# Patient Record
Sex: Male | Born: 1970 | Race: White | Hispanic: No | Marital: Married | Smoking: Never smoker
Health system: Southern US, Community
[De-identification: ages and names within clinical notes are randomized; demographics above are authoritative.]

## PROBLEM LIST (undated history)

## (undated) DIAGNOSIS — C801 Malignant (primary) neoplasm, unspecified: Secondary | ICD-10-CM

## (undated) DIAGNOSIS — T8859XA Other complications of anesthesia, initial encounter: Secondary | ICD-10-CM

## (undated) DIAGNOSIS — K5790 Diverticulosis of intestine, part unspecified, without perforation or abscess without bleeding: Secondary | ICD-10-CM

## (undated) DIAGNOSIS — C189 Malignant neoplasm of colon, unspecified: Secondary | ICD-10-CM

## (undated) DIAGNOSIS — I82401 Acute embolism and thrombosis of unspecified deep veins of right lower extremity: Secondary | ICD-10-CM

## (undated) DIAGNOSIS — R112 Nausea with vomiting, unspecified: Secondary | ICD-10-CM

## (undated) DIAGNOSIS — I10 Essential (primary) hypertension: Secondary | ICD-10-CM

## (undated) DIAGNOSIS — D649 Anemia, unspecified: Secondary | ICD-10-CM

## (undated) DIAGNOSIS — Z9889 Other specified postprocedural states: Secondary | ICD-10-CM

## (undated) DIAGNOSIS — T7840XA Allergy, unspecified, initial encounter: Secondary | ICD-10-CM

## (undated) HISTORY — DX: Anemia, unspecified: D64.9

## (undated) HISTORY — DX: Acute embolism and thrombosis of unspecified deep veins of right lower extremity: I82.401

## (undated) HISTORY — PX: TONSILLECTOMY: SUR1361

## (undated) HISTORY — PX: DIAGNOSTIC LAPAROSCOPY: SUR761

## (undated) HISTORY — DX: Malignant (primary) neoplasm, unspecified: C80.1

## (undated) HISTORY — DX: Malignant neoplasm of colon, unspecified: C18.9

## (undated) HISTORY — PX: WISDOM TOOTH EXTRACTION: SHX21

## (undated) HISTORY — PX: VASECTOMY: SHX75

## (undated) HISTORY — DX: Allergy, unspecified, initial encounter: T78.40XA

## (undated) HISTORY — PX: COLON SURGERY: SHX602

---

## 2002-07-15 ENCOUNTER — Emergency Department (HOSPITAL_COMMUNITY): Admission: EM | Admit: 2002-07-15 | Discharge: 2002-07-15 | Payer: Self-pay | Admitting: Emergency Medicine

## 2015-11-14 ENCOUNTER — Encounter: Payer: Self-pay | Admitting: Physician Assistant

## 2015-11-18 ENCOUNTER — Encounter: Payer: Self-pay | Admitting: *Deleted

## 2015-11-22 ENCOUNTER — Ambulatory Visit (INDEPENDENT_AMBULATORY_CARE_PROVIDER_SITE_OTHER): Payer: BC Managed Care – PPO | Admitting: Physician Assistant

## 2015-11-22 ENCOUNTER — Other Ambulatory Visit (INDEPENDENT_AMBULATORY_CARE_PROVIDER_SITE_OTHER): Payer: BC Managed Care – PPO

## 2015-11-22 ENCOUNTER — Encounter: Payer: Self-pay | Admitting: Physician Assistant

## 2015-11-22 ENCOUNTER — Encounter: Payer: Self-pay | Admitting: Gastroenterology

## 2015-11-22 VITALS — BP 120/78 | HR 79 | Ht 75.0 in | Wt 215.0 lb

## 2015-11-22 DIAGNOSIS — D509 Iron deficiency anemia, unspecified: Secondary | ICD-10-CM | POA: Diagnosis not present

## 2015-11-22 DIAGNOSIS — R5383 Other fatigue: Secondary | ICD-10-CM

## 2015-11-22 LAB — CBC WITH DIFFERENTIAL/PLATELET
BASOS PCT: 0.5 % (ref 0.0–3.0)
Basophils Absolute: 0 10*3/uL (ref 0.0–0.1)
EOS PCT: 3.2 % (ref 0.0–5.0)
Eosinophils Absolute: 0.3 10*3/uL (ref 0.0–0.7)
HEMATOCRIT: 27.4 % — AB (ref 39.0–52.0)
Hemoglobin: 8.4 g/dL — ABNORMAL LOW (ref 13.0–17.0)
LYMPHS ABS: 1.9 10*3/uL (ref 0.7–4.0)
LYMPHS PCT: 22.5 % (ref 12.0–46.0)
MCHC: 30.6 g/dL (ref 30.0–36.0)
MCV: 71.5 fl — AB (ref 78.0–100.0)
MONOS PCT: 8 % (ref 3.0–12.0)
Monocytes Absolute: 0.7 10*3/uL (ref 0.1–1.0)
NEUTROS ABS: 5.6 10*3/uL (ref 1.4–7.7)
NEUTROS PCT: 65.8 % (ref 43.0–77.0)
PLATELETS: 402 10*3/uL — AB (ref 150.0–400.0)
RBC: 3.84 Mil/uL — ABNORMAL LOW (ref 4.22–5.81)
RDW: 18 % — AB (ref 11.5–15.5)
WBC: 8.5 10*3/uL (ref 4.0–10.5)

## 2015-11-22 MED ORDER — NA SULFATE-K SULFATE-MG SULF 17.5-3.13-1.6 GM/177ML PO SOLN: 1.0000 | Freq: Once | ORAL | Status: DC

## 2015-11-22 MED ORDER — FERROUS SULFATE 325 (65 FE) MG PO TABS: 325.0000 mg | ORAL_TABLET | Freq: Two times a day (BID) | ORAL | Status: DC

## 2015-11-22 NOTE — Progress Notes (Signed)
Patient ID: Derry Kassel., male   DOB: [birth year], 45 y.o.   MRN: [id number]   Subjective:    Patient ID: Troy Coder., male    DOB: 04-Aug-1970, 45 y.o.   MRN: [id number]  HPI  Troy Lambert Is a very pleasant  45 year old white male [employer], new to GI today referred by Everardo Beals NP/[place] [facility] for evaluation of iron deficiency anemia. Patient has not had any prior GI workup and is otherwise in good health. He says he began noticing some fatigue last fall which has been persistent and perhaps gradually increased. He initially attributed this to stress and some depression as his father died last summer, he says he had also taken on extra duties at work etc. He says he has felt like he has lost his energy and drive. His appetite has been fine his weight has been stable he denies any nausea or vomiting no dysphagia or odynophagia. Not had any changes in his bowel habits and says he has always had 2-3 bowel movements per day area and he has not noted any melena. He has seen small amounts of bright red blood just on the tissue over the past several months but has hemorrhoids area and he says he has only seen blood a few times. In the past few months. He is not on any regular aspirin or NSAIDs. Family history is negative for colon cancer or polyps. Patient had labs done in July 2016 showed hemoglobin of 14.9 Exline Most recent labs on 11/05/2015 hemoglobin 7.7 hematocrit 25.6 MCV of 77 TIBC 420 serum iron 14 iron sat 3 and ferritin of 4, B-12 within normal limits.  Review of Systems Pertinent positive and negative review of systems were noted in the above HPI section.  All other review of systems was otherwise negative.  Outpatient Encounter Prescriptions as of 11/22/2015  Medication Sig  . loratadine (CLARITIN) 10 MG tablet Take 10 mg by mouth daily.  [name] Kitchen zolpidem (AMBIEN) 10 MG tablet Take 10 mg by mouth at bedtime as needed for sleep.  . ferrous sulfate (IRON SUPPLEMENT) 325 (65  FE) MG tablet Take 1 tablet (325 mg total) by mouth 2 (two) times daily with a meal.  . Na Sulfate-K Sulfate-Mg Sulf SOLN Take 1 kit by mouth once.  . [DISCONTINUED] amoxicillin-clavulanate (AUGMENTIN) 875-125 MG tablet Take 1 tablet by mouth 2 (two) times daily.  . [DISCONTINUED] cefTRIAXone (ROCEPHIN) 1 g injection Inject into the muscle every morning. Take 1 g every day by injection route as directed.  . [DISCONTINUED] cyclobenzaprine (FLEXERIL) 10 MG tablet Take 10 mg by mouth 3 (three) times daily.  . [DISCONTINUED] DOXYCYCLINE HYCLATE PO Take 100 mg by mouth. Take 1 capsule daily  . [DISCONTINUED] HYDROcodone-acetaminophen (NORCO/VICODIN) 5-325 MG tablet Take 1 tablet by mouth.  . [DISCONTINUED] ketorolac (TORADOL) 60 MG/2ML SOLN injection Inject 60 mg into the muscle once. Inject 2 ml as needed by intramuscular route as directed for 1 day.  . [DISCONTINUED] levofloxacin (LEVAQUIN) 250 MG tablet Take 250 mg by mouth daily. Take 2 tablets every day by mouth as directed for 10 days.  . [DISCONTINUED] methylPREDNISolone (MEDROL DOSEPAK) 4 MG TBPK tablet Take by mouth. As directed.  . [DISCONTINUED] mometasone (NASONEX) 50 MCG/ACT nasal spray Place 2 sprays into the nose daily.  . [DISCONTINUED] predniSONE (DELTASONE) 20 MG tablet Take 20 mg by mouth daily with breakfast. Take 3 tablets daily by oral route for 6 days.  . [DISCONTINUED] promethazine-dextromethorphan (PROMETHAZINE-DM) 6.25-15 MG/5ML  syrup Take 5 mLs by mouth 4 (four) times daily as needed for cough. Take 5 ml every 6 hours by mouth for 6 days.  . [DISCONTINUED] traMADol (ULTRAM) 50 MG tablet Take by mouth every 6 (six) hours as needed. Take 1 tab every 6 hours as needed for pain for 7 days  . [DISCONTINUED] traZODone (DESYREL) 150 MG tablet Take by mouth at bedtime.   No facility-administered encounter medications on file as of 11/22/2015.   No Known Allergies There are no active problems to display for this patient.  Social  History   Social History  . Marital Status: Married    Spouse Name: N/A  . Number of Children: N/A  . Years of Education: N/A   Occupational History  . Not on file.   Social History Main Topics  . Smoking status: Never Smoker   . Smokeless tobacco: Current User  . Alcohol Use: 0.0 oz/week    0 Standard drinks or equivalent per week     Comment: a couple of beers a night; 4-5 on weekend  . Drug Use: Not on file  . Sexual Activity: Not on file   Other Topics Concern  . Not on file   Social History Narrative  . No narrative on file    Troy Lambert family history is not on file.      Objective:    Filed Vitals:   11/22/15 1001  BP: 120/78  Pulse: 79    Physical Exam  well-developed white male in no acute distress, accompanied by his wife blood pressure 120/78 pulse 79 height 6 foot 3 weight 215. HEENT;nontraumatic normocephalic EOMI PERRLA sclera anicteric, Cardiovascular; wearing a bulletproof vest ,regular rate and rhythm with S1-S2 no murmur rub or gallop, Pulmonary; clear bilaterally, Abdomen; soft nontender nondistended bowel sounds are active there is no palpable mass or hepatosplenomegaly, Rectal; exam not done, Extremities ;no clubbing cyanosis or edema skin warm and dry, Neuropsych; mood and affect appropriate     Assessment & Plan:   #70  45 year old white male with severe iron deficiency anemia symptomatic with fatigue. Patient has no GI symptoms other than occasional small amounts of bright red blood per rectum which he attributes to external hemorrhoids. We will need to rule out occult colon or upper gut neoplasm, AVMs chronic gastropathy  Plan; Will repeat CBC today to ensure stability and hemoglobin will need to be followed Increase oral iron to 325 mg by mouth twice a day-prescription sent Patient will be scheduled for Colonoscopy and EGD with Dr. Ardis Hughs. Both procedures discussed in detail with patient and his wife including risks and benefits and they  are agreeable to proceed. If endoscopic evaluation unrevealing he will need capsule endoscopy.    Shaman Muscarella S Sharonne Ricketts PA-C 11/22/2015   Cc: Everardo Beals, NP

## 2015-11-22 NOTE — Patient Instructions (Signed)
Please go to the basement level to have your labs drawn.  We sent refills for the Iron supplement to [facility] We have given you a sample of the Suprep.  You have been scheduled for an endoscopy and colonoscopy. Please follow the written instructions given to you at your visit today. Please pick up your prep supplies at the pharmacy within the next 1-3 days. If you use inhalers (even only as needed), please bring them with you on the day of your procedure. Your physician has requested that you go to www.startemmi.com and enter the access code given to you at your visit today. This web site gives a general overview about your procedure. However, you should still follow specific instructions given to you by our office regarding your preparation for the procedure.

## 2015-11-22 NOTE — Progress Notes (Signed)
i agree with the above note, plan 

## 2015-11-23 ENCOUNTER — Encounter: Payer: Self-pay | Admitting: Podiatry

## 2015-11-23 ENCOUNTER — Telehealth: Payer: Self-pay | Admitting: Physician Assistant

## 2015-11-23 ENCOUNTER — Other Ambulatory Visit: Payer: Self-pay

## 2015-11-23 ENCOUNTER — Ambulatory Visit (INDEPENDENT_AMBULATORY_CARE_PROVIDER_SITE_OTHER): Payer: BC Managed Care – PPO

## 2015-11-23 ENCOUNTER — Ambulatory Visit: Payer: BC Managed Care – PPO

## 2015-11-23 ENCOUNTER — Ambulatory Visit (INDEPENDENT_AMBULATORY_CARE_PROVIDER_SITE_OTHER): Payer: BC Managed Care – PPO | Admitting: Podiatry

## 2015-11-23 VITALS — BP 133/82 | HR 85 | Resp 16 | Ht 75.0 in | Wt 215.0 lb

## 2015-11-23 DIAGNOSIS — M79671 Pain in right foot: Secondary | ICD-10-CM

## 2015-11-23 DIAGNOSIS — M722 Plantar fascial fibromatosis: Secondary | ICD-10-CM

## 2015-11-23 DIAGNOSIS — D6489 Other specified anemias: Secondary | ICD-10-CM

## 2015-11-23 DIAGNOSIS — M79672 Pain in left foot: Secondary | ICD-10-CM | POA: Diagnosis not present

## 2015-11-23 MED ORDER — TRIAMCINOLONE ACETONIDE 10 MG/ML IJ SUSP
10.0000 mg | Freq: Once | INTRAMUSCULAR | Status: AC
  Administered 2015-11-23: 10 mg

## 2015-11-23 MED ORDER — DICLOFENAC SODIUM 75 MG PO TBEC: 75.0000 mg | DELAYED_RELEASE_TABLET | Freq: Two times a day (BID) | ORAL | Status: DC

## 2015-11-23 NOTE — Patient Instructions (Signed)

## 2015-11-23 NOTE — Progress Notes (Signed)
Subjective:     Patient ID: Troy Lambert., male   DOB: [birth year], 45 y.o.   MRN: [id number]  HPI patient states that had a lot of pain in my heels for a long time but my right one has become awful over the last several months. States that the patient has tried shoe gear modifications and over-the-counter insoles without relief   Review of Systems  All other systems reviewed and are negative.      Objective:   Physical Exam  Constitutional: He is oriented to person, place, and time.  Cardiovascular: Intact distal pulses.   Musculoskeletal: Normal range of motion.  Neurological: He is oriented to person, place, and time.  Skin: Skin is warm.  Nursing note and vitals reviewed.  neurovascular status intact muscle strength adequate range of motion within normal limits with patient found to have exquisite discomfort plantar aspect right heel insertional point tendon calcaneus with moderate discomfort on the left. Moderate flatfoot deformity is noted and patient has a history of orthotics which is not been replaced and over 7 years     Assessment:     Acute plantar fasciitis distal to the insertion to the calcaneus right with moderate discomfort in the left plantar fashion    Plan:     H&P and x-rays reviewed with patient. Today I injected the plantar fascia bilateral 3 mg Kenalog 5 mill grams Xylocaine and applied fascial brace right and gave a prescription for diclofenac and discussed long-term orthotics. Reappoint to recheck again in the next 2 weeks  X-ray report indicates minimal spur formation mild depression of the arch with no indication of stress fracture calcaneus

## 2015-11-23 NOTE — Progress Notes (Signed)
   Subjective:    Patient ID: Troy Lambert., male    DOB: Dec 17, 1970, 45 y.o.   MRN: [id number]  HPI    Review of Systems  All other systems reviewed and are negative.      Objective:   Physical Exam        Assessment & Plan:

## 2015-11-29 ENCOUNTER — Other Ambulatory Visit (INDEPENDENT_AMBULATORY_CARE_PROVIDER_SITE_OTHER): Payer: BC Managed Care – PPO

## 2015-11-29 DIAGNOSIS — D6489 Other specified anemias: Secondary | ICD-10-CM

## 2015-11-29 LAB — CBC WITH DIFFERENTIAL/PLATELET
BASOS ABS: 0 10*3/uL (ref 0.0–0.1)
Basophils Relative: 0.6 % (ref 0.0–3.0)
EOS ABS: 0.2 10*3/uL (ref 0.0–0.7)
Eosinophils Relative: 2.1 % (ref 0.0–5.0)
HCT: 27.7 % — ABNORMAL LOW (ref 39.0–52.0)
Hemoglobin: 8.5 g/dL — ABNORMAL LOW (ref 13.0–17.0)
LYMPHS ABS: 2.1 10*3/uL (ref 0.7–4.0)
Lymphocytes Relative: 25.3 % (ref 12.0–46.0)
MCHC: 30.3 g/dL (ref 30.0–36.0)
MCV: 74.2 fl — ABNORMAL LOW (ref 78.0–100.0)
Monocytes Absolute: 0.7 10*3/uL (ref 0.1–1.0)
Monocytes Relative: 8.8 % (ref 3.0–12.0)
NEUTROS ABS: 5.2 10*3/uL (ref 1.4–7.7)
NEUTROS PCT: 63.2 % (ref 43.0–77.0)
PLATELETS: 378 10*3/uL (ref 150.0–400.0)
RBC: 3.73 Mil/uL — AB (ref 4.22–5.81)
RDW: 22.7 % — ABNORMAL HIGH (ref 11.5–15.5)
WBC: 8.2 10*3/uL (ref 4.0–10.5)

## 2015-12-02 ENCOUNTER — Encounter: Payer: Self-pay | Admitting: Gastroenterology

## 2015-12-02 ENCOUNTER — Ambulatory Visit (AMBULATORY_SURGERY_CENTER): Payer: BC Managed Care – PPO | Admitting: Gastroenterology

## 2015-12-02 ENCOUNTER — Other Ambulatory Visit (INDEPENDENT_AMBULATORY_CARE_PROVIDER_SITE_OTHER): Payer: BC Managed Care – PPO

## 2015-12-02 ENCOUNTER — Other Ambulatory Visit: Payer: Self-pay

## 2015-12-02 VITALS — BP 109/71 | HR 70 | Temp 99.8°F | Resp 22 | Ht 75.0 in | Wt 215.0 lb

## 2015-12-02 DIAGNOSIS — C189 Malignant neoplasm of colon, unspecified: Secondary | ICD-10-CM

## 2015-12-02 DIAGNOSIS — C184 Malignant neoplasm of transverse colon: Secondary | ICD-10-CM | POA: Diagnosis not present

## 2015-12-02 DIAGNOSIS — D509 Iron deficiency anemia, unspecified: Secondary | ICD-10-CM

## 2015-12-02 DIAGNOSIS — D6489 Other specified anemias: Secondary | ICD-10-CM

## 2015-12-02 DIAGNOSIS — C183 Malignant neoplasm of hepatic flexure: Secondary | ICD-10-CM | POA: Diagnosis not present

## 2015-12-02 LAB — COMPREHENSIVE METABOLIC PANEL
ALK PHOS: 60 U/L (ref 39–117)
ALT: 14 U/L (ref 0–53)
AST: 14 U/L (ref 0–37)
Albumin: 4.5 g/dL (ref 3.5–5.2)
BILIRUBIN TOTAL: 0.5 mg/dL (ref 0.2–1.2)
BUN: 9 mg/dL (ref 6–23)
CO2: 24 mEq/L (ref 19–32)
CREATININE: 0.89 mg/dL (ref 0.40–1.50)
Calcium: 8.9 mg/dL (ref 8.4–10.5)
Chloride: 108 mEq/L (ref 96–112)
GFR: 97.47 mL/min (ref >60.00)
GLUCOSE: 87 mg/dL (ref 70–99)
Potassium: 3.7 mEq/L (ref 3.5–5.1)
Sodium: 142 mEq/L (ref 135–145)
TOTAL PROTEIN: 6.6 g/dL (ref 6.0–8.3)

## 2015-12-02 LAB — CBC WITH DIFFERENTIAL/PLATELET
BASOS PCT: 0.3 % (ref 0.0–3.0)
Basophils Absolute: 0 10*3/uL (ref 0.0–0.1)
Eosinophils Absolute: 0.2 10*3/uL (ref 0.0–0.7)
Eosinophils Relative: 2.2 % (ref 0.0–5.0)
HEMATOCRIT: 27.6 % — AB (ref 39.0–52.0)
Lymphocytes Relative: 20.4 % (ref 12.0–46.0)
Lymphs Abs: 1.4 10*3/uL (ref 0.7–4.0)
MCHC: 30.6 g/dL (ref 30.0–36.0)
MCV: 75.3 fl — AB (ref 78.0–100.0)
Monocytes Absolute: 0.4 10*3/uL (ref 0.1–1.0)
Monocytes Relative: 6.2 % (ref 3.0–12.0)
NEUTROS ABS: 5 10*3/uL (ref 1.4–7.7)
Neutrophils Relative %: 70.9 % (ref 43.0–77.0)
Platelets: 377 10*3/uL (ref 150.0–400.0)
RBC: 3.66 Mil/uL — AB (ref 4.22–5.81)
RDW: 24.8 % — AB (ref 11.5–15.5)
WBC: 7.1 10*3/uL (ref 4.0–10.5)

## 2015-12-02 LAB — PROTIME-INR
INR: 1.3 ratio — ABNORMAL HIGH (ref 0.8–1.0)
Prothrombin Time: 13.8 s — ABNORMAL HIGH (ref 9.6–13.1)

## 2015-12-02 MED ORDER — SODIUM CHLORIDE 0.9 % IV SOLN: 500.0000 mL | INTRAVENOUS | Status: DC

## 2015-12-02 NOTE — Progress Notes (Signed)
Called to room to assist during endoscopic procedure.  Patient ID and intended procedure confirmed with present staff. Received instructions for my participation in the procedure from the performing physician.  

## 2015-12-02 NOTE — Progress Notes (Signed)
Pt is emotional while checking in.  He voiced he was emotional about his father's death which was about a year ago.  maw

## 2015-12-02 NOTE — Progress Notes (Signed)
To pacu vss patent aw report to rn 

## 2015-12-02 NOTE — Op Note (Signed)
[facility] Patient Name: Troy Lambert Procedure Date: 12/02/2015 1:10 PM MRN: [id number] Endoscopist: Milus Banister , MD Age: 45 Referring MD:  Date of Birth: [birth year] Gender: Male Procedure:                Colonoscopy Indications:              Iron deficiency anemia Medicines:                Monitored Anesthesia Care Procedure:                Pre-Anesthesia Assessment:                           - Prior to the procedure, a History and Physical                            was performed, and patient medications and                            allergies were reviewed. The patient's tolerance of                            previous anesthesia was also reviewed. The risks                            and benefits of the procedure and the sedation                            options and risks were discussed with the patient.                            All questions were answered, and informed consent                            was obtained. Prior Anticoagulants: The patient has                            taken no previous anticoagulant or antiplatelet                            agents. ASA Grade Assessment: II - A patient with                            mild systemic disease. After reviewing the risks                            and benefits, the patient was deemed in                            satisfactory condition to undergo the procedure.                           After obtaining informed consent, the colonoscope  was passed under direct vision. Throughout the                            procedure, the patient's blood pressure, pulse, and                            oxygen saturations were monitored continuously. The                            Model CF-HQ190L [id number]) scope was introduced                            through the anus and advanced to the the cecum,                            identified by appendiceal orifice and ileocecal              valve. The colonoscopy was performed without                            difficulty. The patient tolerated the procedure                            well. The quality of the bowel preparation was                            good. The ileocecal valve, appendiceal orifice, and                            rectum were photographed. Scope In: 1:26:33 PM Scope Out: 1:40:49 PM Scope Withdrawal Time: 0 hours 10 minutes 18 seconds  Total Procedure Duration: 0 hours 14 minutes 16 seconds  Findings:                 A non-obstructing, friabel, clearly malignant,                            medium-sized mass was found at the hepatic flexure.                            The mass was partially circumferential (involving                            one-half of the lumen circumference). The mass                            measured four cm in length and it was biopsied with                            a cold forceps for histology. The distal edge of                            the mass was labeled with submucosal injection of  SPOT, two locations.                           There was an 67mm sessile polyp 1cm distal to the                            mass, not sampled or resected.                           Many small-mouthed diverticula were found in the                            left colon.                           The exam was otherwise without abnormality on                            direct and retroflexion views. Complications:            No immediate complications. Estimated blood loss:                            None. Estimated Blood Loss:     Estimated blood loss: none. 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 Biopsied.                            Tattooed.                           - Small satelite polyp 1cm distal to the mass.                           - Diverticulosis in the left colon.                           - The examination was  otherwise normal on direct                            and retroflexion views. Recommendation:           - Patient has a contact number available for                            emergencies. The signs and symptoms of potential                            delayed complications were discussed with the                            patient. Return to normal activities tomorrow.                            Written discharge instructions were provided to the  patient.                           - Resume previous diet.                           - Continue present medications. Continue daily iron.                           - Repeat colonoscopy in 1 year for surveillance.                           - Await pathology results.                           - My office will begin staging workup (chest,                            abdomen, pelvis CT scan with IV and PO contrast;                            CEA level, CMET, INR).                           - My office will arrange referrals to general                            surgery for newly diagnosed colon cancer. Milus Banister, MD 12/02/2015 1:48:54 PM This report has been signed electronically.

## 2015-12-02 NOTE — Progress Notes (Signed)
You have been scheduled for a CT scan of the abdomen and pelvis at [facility] ([address]---this is in the same building as Press [facility]).   You are scheduled on 12/05/15 at 10 am. You should arrive 15 minutes prior to your appointment time for registration. Please follow the written instructions below on the day of your exam:  WARNING: IF YOU ARE ALLERGIC TO IODINE/X-RAY DYE, PLEASE NOTIFY RADIOLOGY IMMEDIATELY AT [phone]! YOU WILL BE GIVEN A 13 HOUR PREMEDICATION PREP.  1) Do not eat or drink anything after 5 am4 hours prior to your test) 2) You have been given 2 bottles of oral contrast to drink. The solution may taste better if refrigerated, but do NOT add ice or any other liquid to this solution. Shake well before drinking.    Drink 1 bottle of contrast @ 8 am hours prior to your exam)  Drink 1 bottle of contrast @ 9 am hours prior to your exam)  You may take any medications as prescribed with a small amount of water except for the following: Metformin, Glucophage, Glucovance, Avandamet, Riomet, Fortamet, Actoplus Met, Janumet, Glumetza or Metaglip. The above medications must be held the day of the exam AND 48 hours after the exam.  The purpose of you drinking the oral contrast is to aid in the visualization of your intestinal tract. The contrast solution may cause some diarrhea. Before your exam is started, you will be given a small amount of fluid to drink. Depending on your individual set of symptoms, you may also receive an intravenous injection of x-ray contrast/dye. Plan on being at [facility] for 30 minutes or longer, depending on the type of exam you are having performed.  This test typically takes 30-45 minutes to complete.  If you have any questions regarding your exam or if you need to reschedule, you may call the CT department at [phone] between the hours of 8:00 am and 5:00 pm,  Monday-Friday.  ________________________________________________________________________  Troy Lambert have been scheduled for an appointment with Dr [name] at [facility]. Your appointment is on 12/09/15 at 1030 am. Please arrive at 10 am for registration. Make certain to bring a list of current medications, including any over the counter medications or vitamins. Also bring your co-pay if you have one as well as your insurance cards. [facility] is located at [address], [address]. Should you need to reschedule your appointment, please contact them at [phone].  Vinnie Level RN in the [facility] has been notified of the appointments and will give instructions to the pt.

## 2015-12-02 NOTE — Patient Instructions (Signed)
YOU HAD AN ENDOSCOPIC PROCEDURE TODAY AT [facility]:   Refer to the procedure report that was given to you for any specific questions about what was found during the examination.  If the procedure report does not answer your questions, please call your gastroenterologist to clarify.  If you requested that your care partner not be given the details of your procedure findings, then the procedure report has been included in a sealed envelope for you to review at your convenience later.  YOU SHOULD EXPECT: Some feelings of bloating in the abdomen. Passage of more gas than usual.  Walking can help get rid of the air that was put into your GI tract during the procedure and reduce the bloating. If you had a lower endoscopy (such as a colonoscopy or flexible sigmoidoscopy) you may notice spotting of blood in your stool or on the toilet paper. If you underwent a bowel prep for your procedure, you may not have a normal bowel movement for a few days.  Please Note:  You might notice some irritation and congestion in your nose or some drainage.  This is from the oxygen used during your procedure.  There is no need for concern and it should clear up in a day or so.  SYMPTOMS TO REPORT IMMEDIATELY:   Following lower endoscopy (colonoscopy or flexible sigmoidoscopy):  Excessive amounts of blood in the stool  Significant tenderness or worsening of abdominal pains  Swelling of the abdomen that is new, acute  Fever of 100F or higher  For urgent or emergent issues, a gastroenterologist can be reached at any hour by calling [phone].   DIET: Your first meal following the procedure should be a small meal and then it is ok to progress to your normal diet. Heavy or fried foods are harder to digest and may make you feel nauseous or bloated.  Likewise, meals heavy in dairy and vegetables can increase bloating.  Drink plenty of fluids but you should avoid alcoholic beverages for 24  hours.  ACTIVITY:  You should plan to take it easy for the rest of today and you should NOT DRIVE or use heavy machinery until tomorrow (because of the sedation medicines used during the test).    FOLLOW UP: Our staff will call the number listed on your records the next business day following your procedure to check on you and address any questions or concerns that you may have regarding the information given to you following your procedure. If we do not reach you, we will leave a message.  However, if you are feeling well and you are not experiencing any problems, there is no need to return our call.  We will assume that you have returned to your regular daily activities without incident.  If any biopsies were taken you will be contacted by phone or by letter within the next 1-3 weeks.  Please call us at [phone] if you have not heard about the biopsies in 3 weeks.    SIGNATURES/CONFIDENTIALITY: You and/or your care partner have signed paperwork which will be entered into your electronic medical record.  These signatures attest to the fact that that the information above on your After Visit Summary has been reviewed and is understood.  Full responsibility of the confidentiality of this discharge information lies with you and/or your care-partner.  See your sheet for CT times and place.   You will have bloodwork today.  You will see the surgeon 12/09/2015 at 10:00am.  Call us if you need us

## 2015-12-03 LAB — CEA: CEA: 1.6 ng/mL

## 2015-12-05 ENCOUNTER — Telehealth: Payer: Self-pay | Admitting: Gastroenterology

## 2015-12-05 ENCOUNTER — Telehealth: Payer: Self-pay | Admitting: *Deleted

## 2015-12-05 ENCOUNTER — Ambulatory Visit (INDEPENDENT_AMBULATORY_CARE_PROVIDER_SITE_OTHER)
Admission: RE | Admit: 2015-12-05 | Discharge: 2015-12-05 | Disposition: A | Discharge status: Home / Self Care | Payer: BC Managed Care – PPO | Source: Ambulatory Visit | Attending: Gastroenterology | Admitting: Gastroenterology

## 2015-12-05 DIAGNOSIS — C189 Malignant neoplasm of colon, unspecified: Secondary | ICD-10-CM

## 2015-12-05 MED ORDER — IOPAMIDOL (ISOVUE-300) INJECTION 61%
100.0000 mL | Freq: Once | INTRAVENOUS | Status: AC | PRN
  Administered 2015-12-05: 100 mL via INTRAVENOUS

## 2015-12-05 NOTE — Telephone Encounter (Signed)
The pt was notified that the results are not available at this time, we will call as soon as reviewed by Dr Ardis Hughs

## 2015-12-05 NOTE — Telephone Encounter (Signed)
  Follow up Call-  Call back number 12/02/2015  Post procedure Call Back phone  # [phone] cell  Permission to leave phone message Yes     Patient questions:  Message left to call us if necessary.

## 2015-12-06 ENCOUNTER — Other Ambulatory Visit: Payer: Self-pay

## 2015-12-06 DIAGNOSIS — D739 Disease of spleen, unspecified: Principal | ICD-10-CM

## 2015-12-06 DIAGNOSIS — C189 Malignant neoplasm of colon, unspecified: Secondary | ICD-10-CM

## 2015-12-06 DIAGNOSIS — D7389 Other diseases of spleen: Secondary | ICD-10-CM

## 2015-12-07 ENCOUNTER — Ambulatory Visit: Payer: BC Managed Care – PPO | Admitting: Podiatry

## 2015-12-08 ENCOUNTER — Ambulatory Visit (HOSPITAL_COMMUNITY)
Admission: RE | Admit: 2015-12-08 | Discharge: 2015-12-08 | Disposition: A | Discharge status: Home / Self Care | Payer: BC Managed Care – PPO | Source: Ambulatory Visit | Attending: Gastroenterology | Admitting: Gastroenterology

## 2015-12-08 DIAGNOSIS — R161 Splenomegaly, not elsewhere classified: Secondary | ICD-10-CM | POA: Diagnosis not present

## 2015-12-08 DIAGNOSIS — C182 Malignant neoplasm of ascending colon: Secondary | ICD-10-CM | POA: Insufficient documentation

## 2015-12-08 DIAGNOSIS — D739 Disease of spleen, unspecified: Secondary | ICD-10-CM | POA: Diagnosis present

## 2015-12-08 DIAGNOSIS — K8020 Calculus of gallbladder without cholecystitis without obstruction: Secondary | ICD-10-CM | POA: Diagnosis not present

## 2015-12-08 DIAGNOSIS — C189 Malignant neoplasm of colon, unspecified: Secondary | ICD-10-CM

## 2015-12-08 DIAGNOSIS — D7389 Other diseases of spleen: Secondary | ICD-10-CM

## 2015-12-08 MED ORDER — GADOBENATE DIMEGLUMINE 529 MG/ML IV SOLN
20.0000 mL | Freq: Once | INTRAVENOUS | Status: AC | PRN
  Administered 2015-12-08: 20 mL via INTRAVENOUS

## 2015-12-09 ENCOUNTER — Encounter: Payer: Self-pay | Admitting: *Deleted

## 2015-12-09 NOTE — Progress Notes (Signed)
Oncology Nurse Navigator Documentation  Oncology Nurse Navigator Flowsheets 12/09/2015  Navigator Location [facility]  Abnormal Finding Date 11/05/2015  Confirmed Diagnosis Date 12/02/2015  Received referral from Dr. Greer Pickerel. Was informed patient can be seen by Dr. Benay Spice post op. Continued work up for spleen lesion. Will add to tumor board after PET is done. Nurse navigator will follow patient and schedule oncology visit when indicated.

## 2015-12-14 ENCOUNTER — Other Ambulatory Visit (HOSPITAL_COMMUNITY): Payer: Self-pay | Admitting: General Surgery

## 2015-12-14 DIAGNOSIS — C189 Malignant neoplasm of colon, unspecified: Secondary | ICD-10-CM

## 2015-12-15 ENCOUNTER — Telehealth: Payer: Self-pay | Admitting: *Deleted

## 2015-12-15 DIAGNOSIS — C801 Malignant (primary) neoplasm, unspecified: Secondary | ICD-10-CM

## 2015-12-15 HISTORY — DX: Malignant (primary) neoplasm, unspecified: C80.1

## 2015-12-15 HISTORY — PX: APPENDECTOMY: SHX54

## 2015-12-15 NOTE — Telephone Encounter (Signed)
  Oncology Nurse Navigator Documentation  Navigator Location: [facility] (12/15/15 1105) Navigator Encounter Type: Introductory phone call (12/15/15 1105)  Called husband at home to discuss seeing Dr. Benay Spice preoperatively at request of his wife (per [facility] office). He is OK seeing MD after surgery, but will defer this decision to his wife. He will have her call navigator with answer. At this time, Dr. Benay Spice can see him on 6/12 at 2 pm. Will await return call of his wife. PET scan is on 6/8, so will present case at GI Conference on 6/21.

## 2015-12-15 NOTE — Telephone Encounter (Signed)
Return call from wife to discuss referral. After several minutes of discussion in regards to PET results and pathology report being main determinate in what his treatment will be, she agreed that post op oncology visit is most appropriate. Informed her we will watch for his PET results and surgery date and will get his appointment scheduled after surgery is done. Added navigator to care team to be notified of his admission. Informed her case will be presented at GI tumor board on 01/04/16. Notified Dr. [name] of our conversation. Wife is calling [facility] to get a follow up appointment scheduled for after the PET scan.

## 2015-12-20 ENCOUNTER — Ambulatory Visit: Payer: Self-pay | Admitting: General Surgery

## 2015-12-20 NOTE — Pre-Procedure Instructions (Addendum)
CT Chest/abd/pelvis  12-05-15 Epic.

## 2015-12-20 NOTE — Patient Instructions (Addendum)
Troy Lambert.  12/20/2015   Your procedure is scheduled on: 12-27-15  Report to [facility] Main  Entrance take [facility]  elevators to 3rd floor to  [place] at [place] AM.  Call this number if you have problems the morning of surgery [phone]   Follow Bowel prep instructions per MD.(Drink Clear liquids plentiful day before surgery).  Remember: ONLY 1 PERSON MAY GO WITH YOU TO SHORT STAY TO GET  READY MORNING OF YOUR SURGERY.  Do not eat food or drink liquids :After Midnight.     Take these medicines the morning of surgery with A SIP OF WATER:  None. DO NOT TAKE ANY DIABETIC MEDICATIONS DAY OF YOUR SURGERY                               You may not have any metal on your body including hair pins and              piercings  Do not wear jewelry, make-up, lotions, powders or perfumes, deodorant             Do not wear nail polish.  Do not shave  48 hours prior to surgery.              Men may shave face and neck.   Do not bring valuables to the hospital. [name].  Contacts, dentures or bridgework may not be worn into surgery.  Leave suitcase in the car. After surgery it may be brought to your room.     Patients discharged the day of surgery will not be allowed to drive home.  Name and phone number of your driver: Troy Lambert- spouse [phone] cell  Special Instructions: N/A              Please read over the following fact sheets you were given: _____________________________________________________________________             [facility] - Preparing for Surgery Before surgery, you can play an important role.  Because skin is not sterile, your skin needs to be as free of germs as possible.  You can reduce the number of germs on your skin by washing with CHG (chlorahexidine gluconate) soap before surgery.  CHG is an antiseptic cleaner which kills germs and bonds with the skin to continue killing germs even  after washing. Please DO NOT use if you have an allergy to CHG or antibacterial soaps.  If your skin becomes reddened/irritated stop using the CHG and inform your nurse when you arrive at Short Stay. Do not shave (including legs and underarms) for at least 48 hours prior to the first CHG shower.  You may shave your face/neck. Please follow these instructions carefully:  1.  Shower with CHG Soap the night before surgery and the  morning of Surgery.  2.  If you choose to wash your hair, wash your hair first as usual with your  normal  shampoo.  3.  After you shampoo, rinse your hair and body thoroughly to remove the  shampoo.                           4.  Use CHG as you  would any other liquid soap.  You can apply chg directly  to the skin and wash                       Gently with a scrungie or clean washcloth.  5.  Apply the CHG Soap to your body ONLY FROM THE NECK DOWN.   Do not use on face/ open                           Wound or open sores. Avoid contact with eyes, ears mouth and genitals (private parts).                       Wash face,  Genitals (private parts) with your normal soap.             6.  Wash thoroughly, paying special attention to the area where your surgery  will be performed.  7.  Thoroughly rinse your body with warm water from the neck down.  8.  DO NOT shower/wash with your normal soap after using and rinsing off  the CHG Soap.                9.  Pat yourself dry with a clean towel.            10.  Wear clean pajamas.            11.  Place clean sheets on your bed the night of your first shower and do not  sleep with pets. Day of Surgery : Do not apply any lotions/deodorants the morning of surgery.  Please wear clean clothes to the hospital/surgery center.  FAILURE TO FOLLOW THESE INSTRUCTIONS MAY RESULT IN THE CANCELLATION OF YOUR SURGERY PATIENT SIGNATURE_________________________________  NURSE  SIGNATURE__________________________________  ________________________________________________________________________

## 2015-12-21 ENCOUNTER — Encounter (INDEPENDENT_AMBULATORY_CARE_PROVIDER_SITE_OTHER): Payer: Self-pay

## 2015-12-21 ENCOUNTER — Encounter (HOSPITAL_COMMUNITY): Payer: Self-pay

## 2015-12-21 ENCOUNTER — Encounter (HOSPITAL_COMMUNITY)
Admission: RE | Admit: 2015-12-21 | Discharge: 2015-12-21 | Disposition: A | Discharge status: Home / Self Care | Payer: BC Managed Care – PPO | Source: Ambulatory Visit | Attending: General Surgery | Admitting: General Surgery

## 2015-12-21 DIAGNOSIS — C189 Malignant neoplasm of colon, unspecified: Secondary | ICD-10-CM | POA: Diagnosis present

## 2015-12-21 DIAGNOSIS — K639 Disease of intestine, unspecified: Secondary | ICD-10-CM | POA: Diagnosis not present

## 2015-12-21 DIAGNOSIS — D7389 Other diseases of spleen: Secondary | ICD-10-CM | POA: Diagnosis not present

## 2015-12-21 DIAGNOSIS — K8020 Calculus of gallbladder without cholecystitis without obstruction: Secondary | ICD-10-CM | POA: Diagnosis not present

## 2015-12-21 DIAGNOSIS — R938 Abnormal findings on diagnostic imaging of other specified body structures: Secondary | ICD-10-CM | POA: Diagnosis not present

## 2015-12-21 HISTORY — DX: Diverticulosis of intestine, part unspecified, without perforation or abscess without bleeding: K57.90

## 2015-12-21 LAB — CBC WITH DIFFERENTIAL/PLATELET
BASOS ABS: 0 10*3/uL (ref 0.0–0.1)
BASOS PCT: 0 %
EOS ABS: 0.2 10*3/uL (ref 0.0–0.7)
Eosinophils Relative: 2 %
HCT: 32.3 % — ABNORMAL LOW (ref 39.0–52.0)
HEMOGLOBIN: 9.8 g/dL — AB (ref 13.0–17.0)
LYMPHS PCT: 26 %
Lymphs Abs: 2 10*3/uL (ref 0.7–4.0)
MCH: 23.7 pg — ABNORMAL LOW (ref 26.0–34.0)
MCHC: 30.3 g/dL (ref 30.0–36.0)
MCV: 78.2 fL (ref 78.0–100.0)
Monocytes Absolute: 0.5 10*3/uL (ref 0.1–1.0)
Monocytes Relative: 7 %
NEUTROS PCT: 65 %
Neutro Abs: 5.1 10*3/uL (ref 1.7–7.7)
Platelets: 424 10*3/uL — ABNORMAL HIGH (ref 150–400)
RBC: 4.13 MIL/uL — AB (ref 4.22–5.81)
RDW: 22.9 % — ABNORMAL HIGH (ref 11.5–15.5)
WBC: 7.8 10*3/uL (ref 4.0–10.5)

## 2015-12-21 LAB — COMPREHENSIVE METABOLIC PANEL
ALBUMIN: 4.7 g/dL (ref 3.5–5.0)
ALK PHOS: 68 U/L (ref 38–126)
ALT: 29 U/L (ref 17–63)
AST: 27 U/L (ref 15–41)
Anion gap: 7 (ref 5–15)
BUN: 13 mg/dL (ref 6–20)
CALCIUM: 9.3 mg/dL (ref 8.9–10.3)
CHLORIDE: 107 mmol/L (ref 101–111)
CO2: 26 mmol/L (ref 22–32)
Creatinine, Ser: 0.98 mg/dL (ref 0.61–1.24)
GFR calc Af Amer: >60 mL/min (ref >60)
GFR calc non Af Amer: >60 mL/min (ref >60)
GLUCOSE: 90 mg/dL (ref 65–99)
Potassium: 4.3 mmol/L (ref 3.5–5.1)
SODIUM: 140 mmol/L (ref 135–145)
Total Bilirubin: 0.5 mg/dL (ref 0.3–1.2)
Total Protein: 7.6 g/dL (ref 6.5–8.1)

## 2015-12-21 LAB — PROTIME-INR
INR: 1.05 (ref 0.00–1.49)
PROTHROMBIN TIME: 13.9 s (ref 11.6–15.2)

## 2015-12-21 LAB — APTT: APTT: 33 s (ref 24–37)

## 2015-12-21 LAB — ABO/RH: ABO/RH(D): A POS

## 2015-12-21 NOTE — Progress Notes (Signed)
12-21-15 1630 Labs viewable in [place]. Hbg = 9.8.

## 2015-12-22 ENCOUNTER — Ambulatory Visit (HOSPITAL_COMMUNITY)
Admission: RE | Admit: 2015-12-22 | Discharge: 2015-12-22 | Disposition: A | Discharge status: Home / Self Care | Payer: BC Managed Care – PPO | Source: Ambulatory Visit | Attending: General Surgery | Admitting: General Surgery

## 2015-12-22 DIAGNOSIS — K8020 Calculus of gallbladder without cholecystitis without obstruction: Secondary | ICD-10-CM | POA: Insufficient documentation

## 2015-12-22 DIAGNOSIS — R938 Abnormal findings on diagnostic imaging of other specified body structures: Secondary | ICD-10-CM | POA: Insufficient documentation

## 2015-12-22 DIAGNOSIS — K639 Disease of intestine, unspecified: Secondary | ICD-10-CM | POA: Insufficient documentation

## 2015-12-22 DIAGNOSIS — C189 Malignant neoplasm of colon, unspecified: Secondary | ICD-10-CM | POA: Diagnosis not present

## 2015-12-22 DIAGNOSIS — D7389 Other diseases of spleen: Secondary | ICD-10-CM | POA: Insufficient documentation

## 2015-12-22 LAB — GLUCOSE, CAPILLARY: GLUCOSE-CAPILLARY: 98 mg/dL (ref 65–99)

## 2015-12-22 MED ORDER — FLUDEOXYGLUCOSE F - 18 (FDG) INJECTION
11.6000 | Freq: Once | INTRAVENOUS | Status: AC | PRN
  Administered 2015-12-22: 11.6 via INTRAVENOUS

## 2015-12-23 ENCOUNTER — Other Ambulatory Visit: Payer: Self-pay | Admitting: General Surgery

## 2015-12-26 NOTE — Anesthesia Preprocedure Evaluation (Addendum)
Anesthesia Evaluation  Patient identified by MRN, date of birth, ID band Patient awake    Reviewed: Allergy & Precautions, H&P , NPO status , Patient's Chart, lab work & pertinent test results  Airway Mallampati: II  TM Distance: >3 FB Neck ROM: full    Dental no notable dental hx. (+) Dental Advisory Given, Teeth Intact   Pulmonary neg pulmonary ROS,    Pulmonary exam normal breath sounds clear to auscultation       Cardiovascular Exercise Tolerance: Good negative cardio ROS Normal cardiovascular exam Rhythm:regular Rate:Normal     Neuro/Psych negative neurological ROS  negative psych ROS   GI/Hepatic negative GI ROS, Neg liver ROS,   Endo/Other  negative endocrine ROS  Renal/GU negative Renal ROS  negative genitourinary   Musculoskeletal negative musculoskeletal ROS (+)   Abdominal   Peds negative pediatric ROS (+)  Hematology negative hematology ROS (+) anemia , hgb 9.8   Anesthesia Other Findings   Reproductive/Obstetrics negative OB ROS                             Anesthesia Physical Anesthesia Plan  ASA: II  Anesthesia Plan: General   Post-op Pain Management:    Induction: Intravenous  Airway Management Planned: Oral ETT  Additional Equipment:   Intra-op Plan:   Post-operative Plan: Extubation in OR  Informed Consent: I have reviewed the patients History and Physical, chart, labs and discussed the procedure including the risks, benefits and alternatives for the proposed anesthesia with the patient or authorized representative who has indicated his/her understanding and acceptance.   Dental Advisory Given  Plan Discussed with: CRNA  Anesthesia Plan Comments:         Anesthesia Quick Evaluation

## 2015-12-27 ENCOUNTER — Inpatient Hospital Stay (HOSPITAL_COMMUNITY): Payer: BC Managed Care – PPO

## 2015-12-27 ENCOUNTER — Inpatient Hospital Stay (HOSPITAL_COMMUNITY)
Admission: RE | Admit: 2015-12-27 | Discharge: 2015-12-31 | DRG: 331 | Disposition: A | Discharge status: Home / Self Care | Payer: BC Managed Care – PPO | Source: Ambulatory Visit | Attending: General Surgery | Admitting: General Surgery

## 2015-12-27 ENCOUNTER — Encounter (HOSPITAL_COMMUNITY)
Admission: RE | Disposition: A | Discharge status: Home / Self Care | Payer: Self-pay | Source: Ambulatory Visit | Attending: General Surgery

## 2015-12-27 ENCOUNTER — Inpatient Hospital Stay (HOSPITAL_COMMUNITY): Payer: BC Managed Care – PPO | Admitting: Anesthesiology

## 2015-12-27 ENCOUNTER — Encounter (HOSPITAL_COMMUNITY): Payer: Self-pay | Admitting: *Deleted

## 2015-12-27 DIAGNOSIS — M722 Plantar fascial fibromatosis: Secondary | ICD-10-CM | POA: Diagnosis present

## 2015-12-27 DIAGNOSIS — R911 Solitary pulmonary nodule: Secondary | ICD-10-CM | POA: Diagnosis present

## 2015-12-27 DIAGNOSIS — K649 Unspecified hemorrhoids: Secondary | ICD-10-CM | POA: Diagnosis present

## 2015-12-27 DIAGNOSIS — Z800 Family history of malignant neoplasm of digestive organs: Secondary | ICD-10-CM

## 2015-12-27 DIAGNOSIS — D7389 Other diseases of spleen: Secondary | ICD-10-CM | POA: Diagnosis present

## 2015-12-27 DIAGNOSIS — D638 Anemia in other chronic diseases classified elsewhere: Secondary | ICD-10-CM | POA: Diagnosis present

## 2015-12-27 DIAGNOSIS — R5383 Other fatigue: Secondary | ICD-10-CM | POA: Diagnosis present

## 2015-12-27 DIAGNOSIS — K8020 Calculus of gallbladder without cholecystitis without obstruction: Secondary | ICD-10-CM | POA: Diagnosis present

## 2015-12-27 DIAGNOSIS — Z419 Encounter for procedure for purposes other than remedying health state, unspecified: Secondary | ICD-10-CM

## 2015-12-27 DIAGNOSIS — C182 Malignant neoplasm of ascending colon: Secondary | ICD-10-CM | POA: Diagnosis present

## 2015-12-27 DIAGNOSIS — R161 Splenomegaly, not elsewhere classified: Secondary | ICD-10-CM | POA: Diagnosis present

## 2015-12-27 HISTORY — PX: CHOLECYSTECTOMY: SHX55

## 2015-12-27 HISTORY — PX: LAPAROSCOPIC PARTIAL COLECTOMY: SHX5907

## 2015-12-27 HISTORY — PX: LAPAROSCOPIC SPLENECTOMY: SHX409

## 2015-12-27 LAB — TYPE AND SCREEN
ABO/RH(D): A POS
ANTIBODY SCREEN: NEGATIVE

## 2015-12-27 LAB — CBC
HCT: 31.6 % — ABNORMAL LOW (ref 39.0–52.0)
Hemoglobin: 9.7 g/dL — ABNORMAL LOW (ref 13.0–17.0)
MCH: 24 pg — ABNORMAL LOW (ref 26.0–34.0)
MCHC: 30.7 g/dL (ref 30.0–36.0)
MCV: 78 fL (ref 78.0–100.0)
PLATELETS: 451 10*3/uL — AB (ref 150–400)
RBC: 4.05 MIL/uL — AB (ref 4.22–5.81)
RDW: 21.8 % — ABNORMAL HIGH (ref 11.5–15.5)
WBC: 29.1 10*3/uL — AB (ref 4.0–10.5)

## 2015-12-27 SURGERY — LAPAROSCOPIC PARTIAL COLECTOMY
Anesthesia: General | Site: Abdomen

## 2015-12-27 MED ORDER — CEFOTETAN DISODIUM 2 G IJ SOLR
2.0000 g | INTRAMUSCULAR | Status: AC
  Administered 2015-12-27: 2 g via INTRAVENOUS
  Filled 2015-12-27: qty 2

## 2015-12-27 MED ORDER — ACETAMINOPHEN 10 MG/ML IV SOLN
INTRAVENOUS | Status: AC
  Filled 2015-12-27: qty 100

## 2015-12-27 MED ORDER — ROCURONIUM BROMIDE 100 MG/10ML IV SOLN
INTRAVENOUS | Status: DC | PRN
  Administered 2015-12-27 (×2): 20 mg via INTRAVENOUS
  Administered 2015-12-27: 60 mg via INTRAVENOUS
  Administered 2015-12-27: 20 mg via INTRAVENOUS
  Administered 2015-12-27: 10 mg via INTRAVENOUS
  Administered 2015-12-27 (×3): 20 mg via INTRAVENOUS

## 2015-12-27 MED ORDER — BUPIVACAINE LIPOSOME 1.3 % IJ SUSP
20.0000 mL | Freq: Once | INTRAMUSCULAR | Status: AC
  Administered 2015-12-27: 20 mL
  Filled 2015-12-27: qty 20

## 2015-12-27 MED ORDER — DIPHENHYDRAMINE HCL 50 MG/ML IJ SOLN
12.5000 mg | Freq: Four times a day (QID) | INTRAMUSCULAR | Status: DC | PRN
  Administered 2015-12-28: 12.5 mg via INTRAVENOUS
  Filled 2015-12-27 (×2): qty 1

## 2015-12-27 MED ORDER — SODIUM CHLORIDE 0.9 % IJ SOLN
INTRAMUSCULAR | Status: AC
Start: 2015-12-27 — End: 2015-12-27
  Filled 2015-12-27: qty 10

## 2015-12-27 MED ORDER — LIDOCAINE HCL (CARDIAC) 20 MG/ML IV SOLN
INTRAVENOUS | Status: AC
  Filled 2015-12-27: qty 5

## 2015-12-27 MED ORDER — ROCURONIUM BROMIDE 100 MG/10ML IV SOLN
INTRAVENOUS | Status: AC
  Filled 2015-12-27: qty 1

## 2015-12-27 MED ORDER — LACTATED RINGERS IV SOLN
INTRAVENOUS | Status: DC | PRN
  Administered 2015-12-27 (×3): via INTRAVENOUS

## 2015-12-27 MED ORDER — SODIUM CHLORIDE 0.9 % IJ SOLN
INTRAMUSCULAR | Status: AC
Start: 2015-12-27 — End: 2015-12-27
  Filled 2015-12-27: qty 20

## 2015-12-27 MED ORDER — ACETAMINOPHEN 10 MG/ML IV SOLN
INTRAVENOUS | Status: DC | PRN
  Administered 2015-12-27: 1000 mg via INTRAVENOUS

## 2015-12-27 MED ORDER — LIP MEDEX EX OINT
TOPICAL_OINTMENT | CUTANEOUS | Status: AC
  Administered 2015-12-27: 14:00:00
  Filled 2015-12-27: qty 7

## 2015-12-27 MED ORDER — DIPHENHYDRAMINE HCL 12.5 MG/5ML PO ELIX: 12.5000 mg | ORAL_SOLUTION | Freq: Four times a day (QID) | ORAL | Status: DC | PRN

## 2015-12-27 MED ORDER — HEPARIN SODIUM (PORCINE) 5000 UNIT/ML IJ SOLN
5000.0000 [IU] | INTRAMUSCULAR | Status: AC
  Administered 2015-12-27: 5000 [IU] via SUBCUTANEOUS
  Filled 2015-12-27: qty 1

## 2015-12-27 MED ORDER — HYDROMORPHONE 1 MG/ML IV SOLN
INTRAVENOUS | Status: DC
Start: 2015-12-27 — End: 2015-12-28
  Administered 2015-12-27: 1 mg via INTRAVENOUS
  Administered 2015-12-28: 2.4 mg via INTRAVENOUS
  Administered 2015-12-28: 0.8 mg via INTRAVENOUS
  Administered 2015-12-28: 2.1 mg via INTRAVENOUS
  Administered 2015-12-28: 1.8 mg via INTRAVENOUS

## 2015-12-27 MED ORDER — IOPAMIDOL (ISOVUE-300) INJECTION 61%
INTRAVENOUS | Status: DC | PRN
  Administered 2015-12-27: 4.5 mL

## 2015-12-27 MED ORDER — BUPIVACAINE-EPINEPHRINE 0.25% -1:200000 IJ SOLN
INTRAMUSCULAR | Status: DC | PRN
  Administered 2015-12-27: 20 mL

## 2015-12-27 MED ORDER — DEXAMETHASONE SODIUM PHOSPHATE 10 MG/ML IJ SOLN
INTRAMUSCULAR | Status: DC | PRN
  Administered 2015-12-27: 10 mg via INTRAVENOUS

## 2015-12-27 MED ORDER — ONDANSETRON HCL 4 MG/2ML IJ SOLN
INTRAMUSCULAR | Status: DC | PRN
  Administered 2015-12-27: 4 mg via INTRAVENOUS

## 2015-12-27 MED ORDER — NALOXONE HCL 0.4 MG/ML IJ SOLN: 0.4000 mg | INTRAMUSCULAR | Status: DC | PRN

## 2015-12-27 MED ORDER — ALVIMOPAN 12 MG PO CAPS
12.0000 mg | ORAL_CAPSULE | Freq: Two times a day (BID) | ORAL | Status: DC
  Administered 2015-12-28 – 2015-12-30 (×5): 12 mg via ORAL
  Filled 2015-12-27 (×8): qty 1

## 2015-12-27 MED ORDER — SODIUM CHLORIDE 0.9% FLUSH: 9.0000 mL | INTRAVENOUS | Status: DC | PRN

## 2015-12-27 MED ORDER — CEFOTETAN DISODIUM-DEXTROSE 2-2.08 GM-% IV SOLR
INTRAVENOUS | Status: AC
Start: 2015-12-27 — End: 2015-12-27
  Filled 2015-12-27: qty 50

## 2015-12-27 MED ORDER — PROPOFOL 10 MG/ML IV BOLUS
INTRAVENOUS | Status: AC
  Filled 2015-12-27: qty 40

## 2015-12-27 MED ORDER — TISSEEL VH 10 ML EX KIT
PACK | CUTANEOUS | Status: AC
  Filled 2015-12-27: qty 1

## 2015-12-27 MED ORDER — HYDROMORPHONE HCL 1 MG/ML IJ SOLN
0.2500 mg | INTRAMUSCULAR | Status: DC | PRN
  Administered 2015-12-27 (×4): 0.5 mg via INTRAVENOUS

## 2015-12-27 MED ORDER — KCL IN DEXTROSE-NACL 20-5-0.45 MEQ/L-%-% IV SOLN
INTRAVENOUS | Status: DC
  Administered 2015-12-27 – 2015-12-31 (×9): via INTRAVENOUS
  Filled 2015-12-27 (×14): qty 1000

## 2015-12-27 MED ORDER — EPHEDRINE SULFATE 50 MG/ML IJ SOLN
INTRAMUSCULAR | Status: AC
  Filled 2015-12-27: qty 1

## 2015-12-27 MED ORDER — SODIUM CHLORIDE 0.9 % IJ SOLN
INTRAMUSCULAR | Status: DC | PRN
  Administered 2015-12-27: 20 mL

## 2015-12-27 MED ORDER — LACTATED RINGERS IR SOLN
Status: DC | PRN
  Administered 2015-12-27: 1000 mL

## 2015-12-27 MED ORDER — HYDROMORPHONE HCL 1 MG/ML IJ SOLN
INTRAMUSCULAR | Status: DC | PRN
  Administered 2015-12-27 (×3): .4 mg via INTRAVENOUS

## 2015-12-27 MED ORDER — ACETAMINOPHEN 10 MG/ML IV SOLN
1000.0000 mg | Freq: Four times a day (QID) | INTRAVENOUS | Status: AC
Start: 2015-12-27 — End: 2015-12-28
  Administered 2015-12-27 – 2015-12-28 (×4): 1000 mg via INTRAVENOUS
  Filled 2015-12-27 (×4): qty 100

## 2015-12-27 MED ORDER — LACTATED RINGERS IV SOLN
INTRAVENOUS | Status: DC | PRN
  Administered 2015-12-27: 07:00:00 via INTRAVENOUS

## 2015-12-27 MED ORDER — 0.9 % SODIUM CHLORIDE (POUR BTL) OPTIME
TOPICAL | Status: DC | PRN
  Administered 2015-12-27: 2000 mL

## 2015-12-27 MED ORDER — ALVIMOPAN 12 MG PO CAPS
12.0000 mg | ORAL_CAPSULE | Freq: Once | ORAL | Status: AC
  Administered 2015-12-27: 12 mg via ORAL
  Filled 2015-12-27: qty 1

## 2015-12-27 MED ORDER — CETYLPYRIDINIUM CHLORIDE 0.05 % MT LIQD
7.0000 mL | Freq: Two times a day (BID) | OROMUCOSAL | Status: DC
  Administered 2015-12-27 – 2015-12-30 (×3): 7 mL via OROMUCOSAL

## 2015-12-27 MED ORDER — CHLORHEXIDINE GLUCONATE 0.12 % MT SOLN
15.0000 mL | Freq: Two times a day (BID) | OROMUCOSAL | Status: DC
  Administered 2015-12-28 – 2015-12-31 (×6): 15 mL via OROMUCOSAL
  Filled 2015-12-27 (×5): qty 15

## 2015-12-27 MED ORDER — SUGAMMADEX SODIUM 200 MG/2ML IV SOLN
INTRAVENOUS | Status: DC | PRN
  Administered 2015-12-27: 200 mg via INTRAVENOUS

## 2015-12-27 MED ORDER — FENTANYL CITRATE (PF) 250 MCG/5ML IJ SOLN
INTRAMUSCULAR | Status: AC
Start: 2015-12-27 — End: 2015-12-27
  Filled 2015-12-27: qty 5

## 2015-12-27 MED ORDER — FENTANYL CITRATE (PF) 100 MCG/2ML IJ SOLN
INTRAMUSCULAR | Status: DC | PRN
  Administered 2015-12-27: 50 ug via INTRAVENOUS
  Administered 2015-12-27 (×4): 100 ug via INTRAVENOUS
  Administered 2015-12-27: 50 ug via INTRAVENOUS

## 2015-12-27 MED ORDER — SUGAMMADEX SODIUM 200 MG/2ML IV SOLN
INTRAVENOUS | Status: AC
  Filled 2015-12-27: qty 2

## 2015-12-27 MED ORDER — CHLORHEXIDINE GLUCONATE 4 % EX LIQD: 60.0000 mL | Freq: Once | CUTANEOUS | Status: DC

## 2015-12-27 MED ORDER — BUPIVACAINE-EPINEPHRINE 0.25% -1:200000 IJ SOLN
INTRAMUSCULAR | Status: AC
  Filled 2015-12-27: qty 1

## 2015-12-27 MED ORDER — HYDROMORPHONE 1 MG/ML IV SOLN
INTRAVENOUS | Status: AC
  Filled 2015-12-27: qty 25

## 2015-12-27 MED ORDER — ONDANSETRON HCL 4 MG/2ML IJ SOLN: 4.0000 mg | Freq: Four times a day (QID) | INTRAMUSCULAR | Status: DC | PRN

## 2015-12-27 MED ORDER — SCOPOLAMINE 1 MG/3DAYS TD PT72
MEDICATED_PATCH | TRANSDERMAL | Status: AC
Start: 2015-12-27 — End: 2015-12-27
  Filled 2015-12-27: qty 1

## 2015-12-27 MED ORDER — IOPAMIDOL (ISOVUE-300) INJECTION 61%
INTRAVENOUS | Status: AC
  Filled 2015-12-27: qty 50

## 2015-12-27 MED ORDER — METHOCARBAMOL 1000 MG/10ML IJ SOLN
500.0000 mg | Freq: Three times a day (TID) | INTRAVENOUS | Status: DC
  Administered 2015-12-27 – 2015-12-31 (×12): 500 mg via INTRAVENOUS
  Filled 2015-12-27 (×2): qty 5
  Filled 2015-12-27 (×2): qty 550
  Filled 2015-12-27: qty 5
  Filled 2015-12-27 (×8): qty 550
  Filled 2015-12-27: qty 5
  Filled 2015-12-27: qty 550

## 2015-12-27 MED ORDER — MIDAZOLAM HCL 2 MG/2ML IJ SOLN
INTRAMUSCULAR | Status: AC
Start: 2015-12-27 — End: 2015-12-27
  Filled 2015-12-27: qty 2

## 2015-12-27 MED ORDER — HYDROMORPHONE HCL 1 MG/ML IJ SOLN
INTRAMUSCULAR | Status: AC
  Filled 2015-12-27: qty 2

## 2015-12-27 MED ORDER — PROPOFOL 10 MG/ML IV BOLUS
INTRAVENOUS | Status: DC | PRN
  Administered 2015-12-27: 200 mg via INTRAVENOUS

## 2015-12-27 MED ORDER — ENOXAPARIN SODIUM 40 MG/0.4ML ~~LOC~~ SOLN
40.0000 mg | SUBCUTANEOUS | Status: DC
  Administered 2015-12-28 – 2015-12-31 (×4): 40 mg via SUBCUTANEOUS
  Filled 2015-12-27 (×4): qty 0.4

## 2015-12-27 MED ORDER — PROCHLORPERAZINE EDISYLATE 5 MG/ML IJ SOLN
10.0000 mg | Freq: Four times a day (QID) | INTRAMUSCULAR | Status: DC | PRN
  Administered 2015-12-29 – 2015-12-31 (×2): 10 mg via INTRAVENOUS
  Filled 2015-12-27 (×2): qty 2

## 2015-12-27 MED ORDER — FAMOTIDINE IN NACL 20-0.9 MG/50ML-% IV SOLN
20.0000 mg | Freq: Two times a day (BID) | INTRAVENOUS | Status: DC
  Administered 2015-12-27 – 2015-12-31 (×8): 20 mg via INTRAVENOUS
  Filled 2015-12-27 (×8): qty 50

## 2015-12-27 MED ORDER — HYDROMORPHONE HCL 2 MG/ML IJ SOLN
INTRAMUSCULAR | Status: AC
  Filled 2015-12-27: qty 1

## 2015-12-27 MED ORDER — LIDOCAINE HCL (CARDIAC) 20 MG/ML IV SOLN
INTRAVENOUS | Status: DC | PRN
  Administered 2015-12-27: 100 mg via INTRAVENOUS

## 2015-12-27 MED ORDER — MIDAZOLAM HCL 5 MG/5ML IJ SOLN
INTRAMUSCULAR | Status: DC | PRN
  Administered 2015-12-27: 2 mg via INTRAVENOUS

## 2015-12-27 MED ORDER — SCOPOLAMINE 1 MG/3DAYS TD PT72
MEDICATED_PATCH | TRANSDERMAL | Status: DC | PRN
  Administered 2015-12-27: 1 via TRANSDERMAL

## 2015-12-27 MED ORDER — ONDANSETRON HCL 4 MG/2ML IJ SOLN
INTRAMUSCULAR | Status: AC
  Filled 2015-12-27: qty 2

## 2015-12-27 MED ORDER — LACTATED RINGERS IV SOLN: INTRAVENOUS | Status: DC

## 2015-12-27 SURGICAL SUPPLY — 112 items
APPLICATOR ARISTA FLEXITIP XL (MISCELLANEOUS) ×3 IMPLANT
APPLIER CLIP 5 13 M/L LIGAMAX5 (MISCELLANEOUS) ×6
APPLIER CLIP ROT 10 11.4 M/L (STAPLE) ×3
BENZOIN TINCTURE PRP APPL 2/3 (GAUZE/BANDAGES/DRESSINGS) ×3 IMPLANT
BLADE EXTENDED COATED 6.5IN (ELECTRODE) ×6 IMPLANT
BLADE SURG SZ10 CARB STEEL (BLADE) IMPLANT
CABLE HIGH FREQUENCY MONO STRZ (ELECTRODE) ×3 IMPLANT
CELLS DAT CNTRL 66122 CELL SVR (MISCELLANEOUS) ×1 IMPLANT
CHLORAPREP W/TINT 26ML (MISCELLANEOUS) ×3 IMPLANT
CLAMP ENDO BABCK 10MM (STAPLE) IMPLANT
CLIP APPLIE 5 13 M/L LIGAMAX5 (MISCELLANEOUS) ×2 IMPLANT
CLIP APPLIE ROT 10 11.4 M/L (STAPLE) ×1 IMPLANT
CONNECTOR 5 IN 1 STRAIGHT STRL (MISCELLANEOUS) ×3 IMPLANT
COUNTER NEEDLE 20 DBL MAG RED (NEEDLE) ×3 IMPLANT
COVER MAYO STAND STRL (DRAPES) ×9 IMPLANT
COVER SURGICAL LIGHT HANDLE (MISCELLANEOUS) ×6 IMPLANT
DECANTER SPIKE VIAL GLASS SM (MISCELLANEOUS) ×3 IMPLANT
DERMABOND ADVANCED (GAUZE/BANDAGES/DRESSINGS) ×2
DERMABOND ADVANCED .7 DNX12 (GAUZE/BANDAGES/DRESSINGS) ×1 IMPLANT
DEVICE TROCAR PUNCTURE CLOSURE (ENDOMECHANICALS) ×3 IMPLANT
DISSECTOR BLUNT TIP ENDO 5MM (MISCELLANEOUS) ×3 IMPLANT
DRAIN CHANNEL 19F RND (DRAIN) IMPLANT
DRAPE C-ARM 42X120 X-RAY (DRAPES) IMPLANT
DRAPE LAPAROSCOPIC ABDOMINAL (DRAPES) ×3 IMPLANT
DRAPE UTILITY XL STRL (DRAPES) ×3 IMPLANT
DRAPE WARM FLUID 44X44 (DRAPE) ×3 IMPLANT
DRSG OPSITE POSTOP 4X6 (GAUZE/BANDAGES/DRESSINGS) ×3 IMPLANT
DRSG OPSITE POSTOP 4X8 (GAUZE/BANDAGES/DRESSINGS) IMPLANT
DRSG TELFA 3X8 NADH (GAUZE/BANDAGES/DRESSINGS) IMPLANT
ELECT PENCIL ROCKER SW 15FT (MISCELLANEOUS) ×3 IMPLANT
ELECT REM PT RETURN 15FT ADLT (MISCELLANEOUS) ×3 IMPLANT
ELECT REM PT RETURN 9FT ADLT (ELECTROSURGICAL) ×3
ELECTRODE REM PT RTRN 9FT ADLT (ELECTROSURGICAL) ×1 IMPLANT
EVACUATOR SILICONE 100CC (DRAIN) IMPLANT
FILTER SMOKE EVAC LAPAROSHD (FILTER) IMPLANT
GAUZE SPONGE 4X4 12PLY STRL (GAUZE/BANDAGES/DRESSINGS) IMPLANT
GLOVE BIO SURGEON STRL SZ7.5 (GLOVE) ×6 IMPLANT
GLOVE BIOGEL M STRL SZ7.5 (GLOVE) IMPLANT
GLOVE INDICATOR 8.0 STRL GRN (GLOVE) ×6 IMPLANT
GOWN STRL REUS W/TWL LRG LVL3 (GOWN DISPOSABLE) ×3 IMPLANT
GOWN STRL REUS W/TWL XL LVL3 (GOWN DISPOSABLE) ×18 IMPLANT
HEMOSTAT ARISTA ABSORB 3G PWDR (MISCELLANEOUS) ×3 IMPLANT
HEMOSTAT SNOW SURGICEL 2X4 (HEMOSTASIS) IMPLANT
HEMOSTAT SURGICEL 4X8 (HEMOSTASIS) IMPLANT
KIT BASIN OR (CUSTOM PROCEDURE TRAY) ×3 IMPLANT
L-HOOK LAP DISP 36CM (ELECTROSURGICAL) ×3
LEGGING LITHOTOMY PAIR STRL (DRAPES) ×3 IMPLANT
LHOOK LAP DISP 36CM (ELECTROSURGICAL) ×1 IMPLANT
LIGASURE IMPACT 36 18CM CVD LR (INSTRUMENTS) ×3 IMPLANT
NS IRRIG 1000ML POUR BTL (IV SOLUTION) ×3 IMPLANT
PACK COLON (CUSTOM PROCEDURE TRAY) ×3 IMPLANT
PAD POSITIONING PINK XL (MISCELLANEOUS) IMPLANT
POSITIONER SURGICAL ARM (MISCELLANEOUS) IMPLANT
POUCH ENDO CATCH II 15MM (MISCELLANEOUS) ×3 IMPLANT
POUCH RETRIEVAL ECOSAC 10 (ENDOMECHANICALS) ×1 IMPLANT
POUCH RETRIEVAL ECOSAC 10MM (ENDOMECHANICALS) ×2
RELOAD PROXIMATE 75MM BLUE (ENDOMECHANICALS) ×9 IMPLANT
RELOAD STAPLER BLUE 60MM (STAPLE) IMPLANT
RELOAD STAPLER WHITE 60MM (STAPLE) ×2 IMPLANT
RTRCTR WOUND ALEXIS 18CM MED (MISCELLANEOUS) ×3
SCISSORS LAP 5X35 DISP (ENDOMECHANICALS) ×3 IMPLANT
SEALANT SURGICAL APPL DUAL CAN (MISCELLANEOUS) IMPLANT
SEPRAFILM MEMBRANE 5X6 (MISCELLANEOUS) IMPLANT
SEPRAFILM PROCEDURAL PACK 3X5 (MISCELLANEOUS) IMPLANT
SET CHOLANGIOGRAPH MIX (MISCELLANEOUS) IMPLANT
SET IRRIG TUBING LAPAROSCOPIC (IRRIGATION / IRRIGATOR) ×3 IMPLANT
SHEARS HARMONIC ACE PLUS 36CM (ENDOMECHANICALS) IMPLANT
SHEARS HARMONIC HDI 36CM (ELECTROSURGICAL) ×3 IMPLANT
SLEEVE XCEL OPT CAN 5 100 (ENDOMECHANICALS) ×18 IMPLANT
SOLUTION ANTI FOG 6CC (MISCELLANEOUS) ×3 IMPLANT
SPONGE LAP 18X18 X RAY DECT (DISPOSABLE) ×3 IMPLANT
STAPLE ECHEON FLEX 60 POW ENDO (STAPLE) ×3 IMPLANT
STAPLER GUN LINEAR PROX 60 (STAPLE) ×3 IMPLANT
STAPLER PROXIMATE 75MM BLUE (STAPLE) ×3 IMPLANT
STAPLER RELOAD BLUE 60MM (STAPLE)
STAPLER RELOAD WHITE 60MM (STAPLE) ×6
STAPLER VISISTAT 35W (STAPLE) ×3 IMPLANT
SUT ETHILON 2 0 PS N (SUTURE) IMPLANT
SUT MNCRL AB 3-0 PS2 18 (SUTURE) ×3 IMPLANT
SUT MNCRL AB 4-0 PS2 18 (SUTURE) ×3 IMPLANT
SUT PDS AB 0 CTX 60 (SUTURE) IMPLANT
SUT PDS AB 1 TP1 96 (SUTURE) ×6 IMPLANT
SUT SILK 2 0 (SUTURE) ×2
SUT SILK 2 0 SH CR/8 (SUTURE) ×3 IMPLANT
SUT SILK 2-0 18XBRD TIE 12 (SUTURE) ×1 IMPLANT
SUT SILK 3 0 (SUTURE) ×2
SUT SILK 3 0 SH CR/8 (SUTURE) ×3 IMPLANT
SUT SILK 3-0 18XBRD TIE 12 (SUTURE) ×1 IMPLANT
SUT VIC AB 1 CTX 18 (SUTURE) IMPLANT
SUT VIC AB 3-0 SH 18 (SUTURE) IMPLANT
SUT VICRYL 0 UR6 27IN ABS (SUTURE) IMPLANT
SYS LAPSCP GELPORT 120MM (MISCELLANEOUS) ×3
SYSTEM LAPSCP GELPORT 120MM (MISCELLANEOUS) ×1 IMPLANT
TAPE CLOTH 4X10 WHT NS (GAUZE/BANDAGES/DRESSINGS) ×3 IMPLANT
TAPE UMBILICAL COTTON 1/8X30 (MISCELLANEOUS) IMPLANT
TOWEL OR 17X26 10 PK STRL BLUE (TOWEL DISPOSABLE) ×3 IMPLANT
TOWEL OR NON WOVEN STRL DISP B (DISPOSABLE) ×3 IMPLANT
TRAY FOLEY W/METER SILVER 14FR (SET/KITS/TRAYS/PACK) ×3 IMPLANT
TRAY FOLEY W/METER SILVER 16FR (SET/KITS/TRAYS/PACK) ×3 IMPLANT
TRAY LAPAROSCOPIC (CUSTOM PROCEDURE TRAY) ×3 IMPLANT
TROCAR BLADELESS 15MM (ENDOMECHANICALS) IMPLANT
TROCAR BLADELESS OPT 5 100 (ENDOMECHANICALS) ×6 IMPLANT
TROCAR BLADELESS OPT 5 75 (ENDOMECHANICALS) IMPLANT
TROCAR XCEL 12X100 BLDLESS (ENDOMECHANICALS) ×3 IMPLANT
TROCAR XCEL BLUNT TIP 100MML (ENDOMECHANICALS) ×3 IMPLANT
TROCAR XCEL NON-BLD 11X100MML (ENDOMECHANICALS) IMPLANT
TROCAR XCEL UNIV SLVE 11M 100M (ENDOMECHANICALS) IMPLANT
TUBING CONNECTING 10 (TUBING) ×2 IMPLANT
TUBING CONNECTING 10' (TUBING) ×1
TUBING INSUF HEATED (TUBING) ×3 IMPLANT
WATER STERILE IRR 1500ML POUR (IV SOLUTION) ×3 IMPLANT
YANKAUER SUCT BULB TIP 10FT TU (MISCELLANEOUS) ×6 IMPLANT

## 2015-12-27 NOTE — Interval H&P Note (Signed)
History and Physical Interval Note:  12/27/2015 7:13 AM  Troy Lambert.  has presented today for surgery, with the diagnosis of COLON CANCER, SYMPTOMATIC CHOLELITHIASIS, SPLENIC MASS  The various methods of treatment have been discussed with the patient and family. After consideration of risks, benefits and other options for treatment, the patient has consented to  Procedure(s): LAPAROSCOPIC PARTIAL COLECTOMY (N/A) LAPAROSCOPIC CHOLECYSTECTOMY WITH INTRAOPERATIVE CHOLANGIOGRAM (N/A)  LAPAROSCOPIC SPLENECTOMY (N/A) as a surgical intervention .  The patient's history has been reviewed, patient examined, no change in status, stable for surgery.  I have reviewed the patient's chart and labs.  Questions were answered to the patient's satisfaction.    Leighton Ruff. [name], MD, [place], Bariatric, & Minimally Invasive Surgery [facility] Surgery, [place]   [facility] M

## 2015-12-27 NOTE — Transfer of Care (Signed)
Immediate Anesthesia Transfer of Care Note  Patient: Troy Lambert.  Procedure(s) Performed: Procedure(s): LAPAROSCOPIC PARTIAL RIGHT COLECTOMY (N/A) LAPAROSCOPIC CHOLECYSTECTOMY WITH INTRAOPERATIVE CHOLANGIOGRAM (N/A)  LAPAROSCOPIC SPLENECTOMY (N/A)  Patient Location: PACU  Anesthesia Type:General  Level of Consciousness: awake, alert  and oriented  Airway & Oxygen Therapy: Patient Spontanous Breathing and Patient connected to face mask oxygen  Post-op Assessment: Report given to RN and Post -op Vital signs reviewed and stable  Post vital signs: Reviewed and stable  Last Vitals:  Filed Vitals:   12/27/15 0517  BP: 116/74  Pulse: 82  Temp: 37 C  Resp: 18    Last Pain: There were no vitals filed for this visit.    Patients Stated Pain Goal: 3 ([id number])  Complications: No apparent anesthesia complications

## 2015-12-27 NOTE — Brief Op Note (Signed)
12/27/2015  12:02 PM  PATIENT:  Troy Lambert.  45 y.o. male  PRE-OPERATIVE DIAGNOSIS:  ASCENDING COLON CANCER, SYMPTOMATIC CHOLELITHIASIS, SPLENIC MASS  POST-OPERATIVE DIAGNOSIS:  COLON CANCER, SYMPTOMATIC CHOLELITHIASIS, SPLENIC MASS  PROCEDURE:  Procedure(s): LAPAROSCOPIC PARTIAL RIGHT COLECTOMY (N/A) LAPAROSCOPIC CHOLECYSTECTOMY WITH INTRAOPERATIVE CHOLANGIOGRAM (N/A)  LAPAROSCOPIC SPLENECTOMY (N/A)  SURGEON:  Surgeon(s) and Role:    * Excell Seltzer, MD - Assisting    * Greer Pickerel, MD - Primary  PHYSICIAN ASSISTANT:   ASSISTANTS: SEE ABOVE   ANESTHESIA:   general  EBL:  Total I/O In: 2000 [I.V.:2000] Out: 310 [Urine:260; Blood:50]  BLOOD ADMINISTERED:none  DRAINS: Urinary Catheter (Foley)   LOCAL MEDICATIONS USED:  MARCAINE    and OTHER EXPAREL  SPECIMEN:  Source of Specimen:  RIGHT COLON; GALLBLADDER, SPLEEN  DISPOSITION OF SPECIMEN:  PATHOLOGY  COUNTS:  YES  TOURNIQUET:  * No tourniquets in log *  DICTATION: .Other Dictation: Dictation Number [id number]  PLAN OF CARE: Admit to inpatient   PATIENT DISPOSITION:  PACU - hemodynamically stable.   Delay start of Pharmacological VTE agent (>24hrs) due to surgical blood loss or risk of bleeding: no  Troy Lambert. Troy Pulling, MD, FACS General, Bariatric, & Minimally Invasive Surgery [facility] Surgery, [place]

## 2015-12-27 NOTE — Anesthesia Postprocedure Evaluation (Signed)
Anesthesia Post Note  Patient: Troy Lambert.  Procedure(s) Performed: Procedure(s) (LRB): LAPAROSCOPIC PARTIAL RIGHT COLECTOMY (N/A) LAPAROSCOPIC CHOLECYSTECTOMY WITH INTRAOPERATIVE CHOLANGIOGRAM (N/A)  LAPAROSCOPIC SPLENECTOMY (N/A)  Patient location during evaluation: PACU Anesthesia Type: General Level of consciousness: awake and alert Pain management: pain level controlled Vital Signs Assessment: post-procedure vital signs reviewed and stable Respiratory status: spontaneous breathing, nonlabored ventilation, respiratory function stable and patient connected to nasal cannula oxygen Cardiovascular status: blood pressure returned to baseline and stable Postop Assessment: no signs of nausea or vomiting Anesthetic complications: no    Last Vitals:  Filed Vitals:   12/27/15 1245 12/27/15 1300  BP: 152/75 152/83  Pulse: 94 94  Temp:    Resp: 15 16    Last Pain:  Filed Vitals:   12/27/15 1302  PainSc: 3                  Jhoselin Crume L

## 2015-12-27 NOTE — H&P (Signed)
Troy Lambert. Troy Lambert 12/09/2015 10:30 AM Location: [facility] Patient #: [id number] DOB: 1971/03/05 Married / Language: Cleophus Molt / Race: White Male   History of Present Illness Randall Hiss M. Damita Eppard MD; 12/12/2015 2:34 PM) The patient is a 45 year old male who presents with colorectal cancer. He is referred by Dr Ardis Hughs for evaluation of a newly diagnosed hepatic flexure colon cancer. 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 It persisted and gradually worsened. This prompted him to seek medical attention. He was found to have significant anemia with a hemoglobin as low as 8. He was found to have iron deficiency anemia. He does not take NSAIDs or aspirin on a regular basis. This prompted a referral to gastroenterology where he underwent a colonoscopy. He underwent a colonoscopy on May 19 which showed a nonobstructing friable medium size mass at the hepatic flexure. It was partially circumferential. It was biopsied. He also diverticuli in the left colon. The ileocecal valve was reached. Unfortunately his mass at the hepatic flexure came back positive for adenocarcinoma. His CEA level was 1.6. His most recent hemoglobin that I have was 8.4 with hematocrit of 27.6. Albumin 4.5. He underwent a CT scan of his chest abdomen and pelvis. He was found to have a 3 mm nodule in the inferior aspect of the left lower lobe which was nonspecific. He was found to have a calcified gallstone measuring 2.6 x 2.2 cm. No evidence of cholecystitis. He was found to have a 3.8 x 3.3 x 3.5 hypervascular lesion in the medial aspect of the spleen. He was also found to have the known malignancy in the hepatic flexure measuring 3.2 x 4.4 x 2.8 on CT. There is no obvious pericolonic lymphadenopathy. He underwent an MRI of his abdomen to further characterize the splenic lesion. On MRI of the lesion was 4.1 x 3.9 cm in the medial upper spleen which  demonstrated heterogeneous T2 hypointensity, T1 isointensity and progressive heterogeneous enhancement. Also seen on his MRI was the known large gallstone as well as the hepatic colonic mass. The splenic lesion was still felt to be indeterminate by MRI. It could represent a benign inflammatory pseudotumor of the spleen or metastasis which would be quite unusual.  In talking with the patient he states that for the past couple months he has probably had some mild upper abdominal discomfort mainly in the epigastrium and right upper quadrant especially after eating a greasy meal. He denies any family history of abdominal cancers. He does not smoke. His cardiac review of systems is negative. He does dip. He has also had some issues with plantar fasciitis.   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 Ferrous Sulfate (325 (65 Fe)MG Tablet, Oral) Active. Medications Reconciled  Social History Elbert Ewings, [place]; 12/09/2015 10:17 AM) Alcohol use Moderate alcohol use. Caffeine use Carbonated beverages. No drug use  Family History Elbert Ewings, [place]; 12/09/2015 10:17 AM) Cancer Father. Respiratory Condition Father.  Review of Systems Elbert Ewings CMA; 12/09/2015 10:17 AM) General Present- Fatigue. Not Present- Appetite Loss, Chills, Fever, Night Sweats, Weight Gain and Weight  Loss. Skin Not Present- Change in Wart/Mole, Dryness, Hives, Jaundice, New Lesions, Non-Healing Wounds, Rash and Ulcer. HEENT Not Present- Earache, Hearing Loss, Hoarseness, Nose Bleed, Oral Ulcers, Ringing in the Ears, Seasonal Allergies, Sinus Pain, Sore Throat, Visual Disturbances, Wears glasses/contact lenses and Yellow Eyes. Respiratory Not Present- Bloody sputum, Chronic Cough, Difficulty Breathing, Snoring and Wheezing. Breast Not Present- Breast Mass, Breast Pain, Nipple Discharge and Skin Changes. Cardiovascular Not Present- Chest Pain, Difficulty Breathing Lying Down, Leg Cramps, Palpitations, Rapid Heart Rate, Shortness of Breath and Swelling of Extremities. Gastrointestinal Not Present- Abdominal Pain, Bloating, Bloody Stool, Change in Bowel Habits, Chronic diarrhea, Constipation, Difficulty Swallowing, Excessive gas, Gets full quickly at meals, Hemorrhoids, Indigestion, Nausea, Rectal Pain and Vomiting. Musculoskeletal Not Present- Back Pain, Joint Pain, Joint Stiffness, Muscle Pain, Muscle Weakness and Swelling of Extremities. Neurological Not Present- Decreased Memory, Fainting, Headaches, Numbness, Seizures, Tingling, Tremor, Trouble walking and Weakness. Psychiatric Not Present- Anxiety, Bipolar, Change in Sleep Pattern, Depression, Fearful and Frequent crying. Endocrine Not Present- Cold Intolerance, Excessive Hunger, Hair Changes, Heat Intolerance, Hot flashes and New Diabetes. Hematology Not Present- Easy Bruising, Excessive bleeding, Gland problems, HIV and Persistent Infections.  Vitals Elbert Ewings CMA; 12/09/2015 10:18 AM) 12/09/2015 10:17 AM Weight: 208 lb Height: 72in Body Surface Area: 2.17 m Body Mass Index: 28.21 kg/m  Temp.: 98.79F(Temporal)  Pulse: 77 (Regular)  BP: 150/86 (Sitting, Left Arm, Standard)       Physical Exam Randall Hiss M. Lynk Marti MD; 12/12/2015 2:26 PM) General Mental Status-Alert. General Appearance-Consistent with stated  age. Hydration-Well hydrated. Voice-Normal.  Head and Neck Head-normocephalic, atraumatic with no lesions or palpable masses. Trachea-midline. Thyroid Gland Characteristics - normal size and consistency.  Eye Eyeball - Bilateral-Extraocular movements intact. Sclera/Conjunctiva - Bilateral-No scleral icterus.  Chest and Lung Exam Chest and lung exam reveals -quiet, even and easy respiratory effort with no use of accessory muscles and on auscultation, normal breath sounds, no adventitious sounds and normal vocal resonance. Inspection Chest Wall - Normal. Back - normal.  Breast - Did not examine.  Cardiovascular Cardiovascular examination reveals -normal heart sounds, regular rate and rhythm with no murmurs and normal pedal pulses bilaterally.  Abdomen Inspection Inspection of the abdomen reveals - No Hernias. Skin - Scar - no surgical scars. Palpation/Percussion Palpation and Percussion of the abdomen reveal - Soft, Non Tender, No Rebound tenderness, No Rigidity (guarding) and No hepatosplenomegaly. Auscultation Auscultation of the abdomen reveals - Bowel sounds normal.  Peripheral Vascular Upper Extremity Palpation - Pulses bilaterally normal.  Neurologic Neurologic evaluation reveals -alert and oriented x 3 with no impairment of recent or remote memory. Mental Status-Normal.  Neuropsychiatric The patient's mood and affect are described as -normal. Judgment and Insight-insight is appropriate concerning matters relevant to self.  Musculoskeletal Normal Exam - Left-Upper Extremity Strength Normal and Lower Extremity Strength Normal. Normal Exam - Right-Upper Extremity Strength Normal and Lower Extremity Strength Normal.  Lymphatic Head & Neck  General Head & Neck Lymphatics: Bilateral - Description - Normal. Axillary - Did not examine. Femoral & Inguinal - Did not examine.    Assessment & Plan Randall Hiss M. 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 He was given Neurosurgeon. I spent probably an hour with him, his wife, and his mother. Unfortunately his case is not entirely straightforward. He  does have a large gallstone measuring more than 2.6 cm. And in talking with him it sounds like he has had episodes of symptomatic cholelithiasis. Therefore I recommended proceeding with cholecystectomy at the time of his right colectomy. We also discussed the finding of the splenic mass. We discussed that it would be highly unusual for colon cancer to metastasize to the spleen. Unfortunately the MRI did not add a lot of clarity. It is an unusual appearing mass on MRI. I recommended proceeding with PET imaging to see if we can get further additional information. We discussed how PET imaging warts. We discussed that if it was cold on PET imaging that would be a little bit more reassuring. However if it was positive on PET imaging this could represent a malignancy versus a benign condition that could be a false positive. We discussed potential splenic biopsy with FNA however due to the location of the lesion that could be higher risk for bleeding. We discussed that ultimately he may need a splenectomy for definitive diagnosis. We will proceed with PET imaging followed by presentation of his case at GI tumor Board. Right now the plan is laparoscopic right colectomy with cholecystectomy with possible splenectomy. Impression: I discussed the procedure in detail. The patient was given Neurosurgeon. We discussed the risks and benefits of surgery including, but not limited to bleeding, infection (such as wound infection, abdominal abscess), injury to surrounding structures, blood clot formation, urinary retention, incisional hernia, anastomotic stricture, anastomotic leak, anesthesia risks, pulmonary & cardiac complications such as pneumonia &/or heart  attack, need for additional procedures, ileus, & prolonged hospitalization. We discussed the typical postoperative recovery course, including limitations & restrictions postoperatively. I explained that the likelihood of improvement in their symptoms is good.  The patient has elected to proceed with surgery. Current Plans Pt Education - flb right colectomy: discussed with patient and provided information. Pt Education - Pamphlet Given - Laparoscopic Colorectal Surgery: discussed with patient and provided information. 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 Therefore recommend proceeding with cholecystectomy during his colectomy.  We discussed gallbladder disease. The patient was given Neurosurgeon. We discussed non-operative and operative management. We discussed the signs & symptoms of acute cholecystitis  I discussed laparoscopic cholecystectomy with IOC in detail. The patient was given educational material as well as diagrams detailing the procedure. We discussed the risks and benefits of a laparoscopic cholecystectomy including, but not limited to bleeding, infection, injury to surrounding structures such as the intestine or liver, bile leak, retained gallstones, need to convert to an open procedure, prolonged diarrhea, blood clots such as DVT, common bile duct injury, anesthesia risks, and possible need for additional procedures. We discussed the typical post-operative recovery course. I explained that the likelihood of improvement of their symptoms is good.  The patient has elected to proceed with surgery. Current Plans Pt Education - Pamphlet Given - Laparoscopic Gallbladder Surgery: discussed with patient and provided information. 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 We will proceed with PET imaging to see if we can get further clarification about the nature of this mass. However we did  discuss that ultimately he may end up with splenectomy for definitive diagnosis. We discussed risk of splenectomy such as bleeding, infection, injury surrounding structures, pancreatic leak, need for immunizations to prevent infections, hernia.   Leighton Ruff. [name], MD, FACS General, Bariatric, & Minimally Invasive Surgery [facility] Surgery, [place]

## 2015-12-27 NOTE — Anesthesia Procedure Notes (Signed)
Procedure Name: Intubation Date/Time: 12/27/2015 7:44 AM Performed by: Glory Buff Pre-anesthesia Checklist: Patient identified, Emergency Drugs available, Suction available and Patient being monitored Patient Re-evaluated:Patient Re-evaluated prior to inductionOxygen Delivery Method: Circle system utilized Preoxygenation: Pre-oxygenation with 100% oxygen Intubation Type: IV induction Ventilation: Mask ventilation without difficulty Laryngoscope Size: Miller and 3 Grade View: Grade I Tube type: Oral Tube size: 8.0 mm Number of attempts: 1 Airway Equipment and Method: Stylet and Oral airway Placement Confirmation: ETT inserted through vocal cords under direct vision,  positive ETCO2 and breath sounds checked- equal and bilateral Secured at: 22 cm Tube secured with: Tape Dental Injury: Teeth and Oropharynx as per pre-operative assessment

## 2015-12-28 ENCOUNTER — Encounter (HOSPITAL_COMMUNITY): Payer: Self-pay | Admitting: General Surgery

## 2015-12-28 LAB — BASIC METABOLIC PANEL
ANION GAP: 5 (ref 5–15)
BUN: 9 mg/dL (ref 6–20)
CO2: 27 mmol/L (ref 22–32)
Calcium: 8.4 mg/dL — ABNORMAL LOW (ref 8.9–10.3)
Chloride: 105 mmol/L (ref 101–111)
Creatinine, Ser: 0.85 mg/dL (ref 0.61–1.24)
GFR calc Af Amer: >60 mL/min (ref >60)
Glucose, Bld: 121 mg/dL — ABNORMAL HIGH (ref 65–99)
POTASSIUM: 4 mmol/L (ref 3.5–5.1)
SODIUM: 137 mmol/L (ref 135–145)

## 2015-12-28 LAB — CBC
HEMATOCRIT: 28.5 % — AB (ref 39.0–52.0)
HEMOGLOBIN: 8.6 g/dL — AB (ref 13.0–17.0)
MCH: 23.4 pg — ABNORMAL LOW (ref 26.0–34.0)
MCHC: 30.2 g/dL (ref 30.0–36.0)
MCV: 77.7 fL — ABNORMAL LOW (ref 78.0–100.0)
Platelets: 373 10*3/uL (ref 150–400)
RBC: 3.67 MIL/uL — AB (ref 4.22–5.81)
RDW: 21.8 % — AB (ref 11.5–15.5)
WBC: 12.1 10*3/uL — AB (ref 4.0–10.5)

## 2015-12-28 MED ORDER — MORPHINE SULFATE (PF) 2 MG/ML IV SOLN
1.0000 mg | INTRAVENOUS | Status: DC | PRN
  Administered 2015-12-28: 2 mg via INTRAVENOUS
  Administered 2015-12-28 (×2): 4 mg via INTRAVENOUS
  Administered 2015-12-28 – 2015-12-29 (×2): 2 mg via INTRAVENOUS
  Administered 2015-12-29: 4 mg via INTRAVENOUS
  Administered 2015-12-29: 2 mg via INTRAVENOUS
  Administered 2015-12-29: 4 mg via INTRAVENOUS
  Filled 2015-12-28: qty 1
  Filled 2015-12-28 (×3): qty 2
  Filled 2015-12-28 (×2): qty 1
  Filled 2015-12-28: qty 2
  Filled 2015-12-28: qty 1

## 2015-12-28 MED ORDER — ONDANSETRON HCL 4 MG/2ML IJ SOLN
4.0000 mg | INTRAMUSCULAR | Status: DC | PRN
  Administered 2015-12-31: 4 mg via INTRAVENOUS
  Filled 2015-12-28: qty 2

## 2015-12-28 MED ORDER — ACETAMINOPHEN 500 MG PO TABS
1000.0000 mg | ORAL_TABLET | Freq: Four times a day (QID) | ORAL | Status: AC
  Administered 2015-12-28 – 2015-12-30 (×8): 1000 mg via ORAL
  Filled 2015-12-28 (×8): qty 2

## 2015-12-28 MED ORDER — DIPHENHYDRAMINE HCL 50 MG/ML IJ SOLN
25.0000 mg | Freq: Four times a day (QID) | INTRAMUSCULAR | Status: DC | PRN
  Administered 2015-12-28 – 2015-12-30 (×3): 25 mg via INTRAVENOUS
  Filled 2015-12-28 (×3): qty 1

## 2015-12-28 NOTE — Progress Notes (Signed)
1 Day Post-Op  Subjective: No n/v. No burping. Tolerating ice. No flatus. Pain ok. IS 2200. Foley out at 0600  Objective: Vital signs in last 24 hours: Temp:  [97.8 F (36.6 C)-99.3 F (37.4 C)] 98 F (36.7 C) (06/14 0644) Pulse Rate:  [60-99] 60 (06/14 0644) Resp:  [12-24] 18 (06/14 0644) BP: (115-160)/(63-88) 115/63 mmHg (06/14 0644) SpO2:  [98 %-100 %] 99 % (06/14 0644) FiO2 (%):  [98 %-99 %] 99 % (06/14 0337) Last BM Date: 12/26/15  Intake/Output from previous day: 06/13 0701 - 06/14 0700 In: 5600 [I.V.:5300; IV Piggyback:300] Out: 2850 [Urine:2800; Blood:50] Intake/Output this shift:    Alert, pleasant, nad cta b/l Reg Soft, mild dist; min TTP; dressing c/d/i No edema  Lab Results:   Recent Labs  12/27/15 1241 12/28/15 0357  WBC 29.1* 12.1*  HGB 9.7* 8.6*  HCT 31.6* 28.5*  PLT 451* 373   BMET  Recent Labs  12/28/15 0357  NA 137  K 4.0  CL 105  CO2 27  GLUCOSE 121*  BUN 9  CREATININE 0.85  CALCIUM 8.4*   PT/INR No results for input(s): LABPROT, INR in the last 72 hours. ABG No results for input(s): PHART, HCO3 in the last 72 hours.  Invalid input(s): PCO2, PO2  Studies/Results: Dg Cholangiogram Operative  12/27/2015  CLINICAL DATA:  Laparoscopic cholecystectomy. EXAM: INTRAOPERATIVE CHOLANGIOGRAM FLUOROSCOPY TIME:  12 seconds COMPARISON:  PET scan - 12/22/2015 ; abdominal MRI - 12/08/2015 FINDINGS: Intraoperative cholangiographic images of the right upper abdominal quadrant during laparoscopic cholecystectomy are provided for review. Surgical clips overlie the expected location of the gallbladder fossa. Contrast injection demonstrates selective cannulation of the central aspect of the cystic duct. There is passage of contrast through the central aspect of the cystic duct with filling of a non dilated common bile duct. 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 There is passage of contrast though the CBD  and into the descending portion of the duodenum. There is minimal reflux of injected contrast into the common hepatic duct and central aspect of the non dilated intrahepatic biliary system. There is minimal opacification the central aspect of the pancreatic duct which appears nondilated. There are no discrete filling defects within the opacified portions of the biliary system to suggest the presence of choledocholithiasis. IMPRESSION: No evidence of choledocholithiasis. Electronically Signed   By: Sandi Mariscal M.D.   On: 12/27/2015 10:31    Anti-infectives: Anti-infectives    Start     Dose/Rate Route Frequency Ordered Stop   12/27/15 0513  cefoTEtan (CEFOTAN) 2 g in dextrose 5 % 50 mL IVPB     2 g 100 mL/hr over 30 Minutes Intravenous On call to O.R. 12/27/15 [id number] 12/27/15 0816      Assessment/Plan: s/p Procedure(s): LAPAROSCOPIC PARTIAL RIGHT COLECTOMY (N/A) LAPAROSCOPIC CHOLECYSTECTOMY WITH INTRAOPERATIVE CHOLANGIOGRAM (N/A)  LAPAROSCOPIC SPLENECTOMY (N/A)  Doing well Ambulate pulm toilet Chemical vte prophylaxis Clear liquids Family at bedside and answered questions.   Leighton Ruff. [name], MD, FACS General, Bariatric, & Minimally Invasive Surgery [facility], [place]   LOS: 1 day    [name] 12/28/2015

## 2015-12-28 NOTE — Op Note (Signed)
NAMEGLEEN, NORTZ NO.:  [id number]  MEDICAL RECORD NO.:  [id number]  LOCATION:  36                         FACILITY:  [facility]  PHYSICIAN:  Leighton Ruff. [name], MD, FACSDATE OF BIRTH:  12-27-1970  DATE OF PROCEDURE:  12/27/2015 DATE OF DISCHARGE:                              OPERATIVE REPORT   PREOPERATIVE DIAGNOSES: 1. Ascending colon adenocarcinoma. 2. Symptomatic cholelithiasis. 3. Splenic mass.  POSTOPERATIVE DIAGNOSES: 1. Ascending colon adenocarcinoma. 2. Symptomatic cholelithiasis. 3. Splenic mass.  PROCEDURE: 1. Laparoscopic partial right colectomy. 2. Laparoscopic cholecystectomy with intraoperative cholangiogram. 3. Laparoscopic splenectomy.  SURGEON:  Leighton Ruff. [name], MD, FACS  ASSISTANT SURGEON:  Darene Lamer. Hoxworth, M.D. FACS  ANESTHESIA:  General.  ESTIMATED BLOOD LOSS:  50 mL.  SPECIMENS: 1. Right colon. 2. Gallbladder. 3. Spleen.  INDICATIONS FOR PROCEDURE:  The patient is a pleasant 45 year old, who had unexplained persistent fatigue in the fall of 2016.  He was found to be anemic and ultimately underwent a colonoscopy, which showed a mass in the ascending colon near the hepatic flexure.  It was biopsy proven to be adenocarcinoma.  He underwent oncological workup and was found to have a large gallstone as well as a 4 cm splenic mass in the upper pole. He was referred for surgical evaluation.  In talking with him, he had been having postprandial right upper quadrant pain radiating to his back, so in addition to discussing right colectomy for his known colon cancer, we discussed laparoscopic cholecystectomy as well since it appeared he had symptomatic cholelithiasis.  With respect to the splenic lesion, it was indeterminate on CT imaging.  It could not be ruled out as a malignant lesion.  This prompted an MRI, which was also unfortunately indeterminate.  The next part of the work involved getting a PET-CT, which showed a mildly  hypermetabolic activity in splenic lesion.  Based on this, I recommended splenectomy.  You can see my notes in the patient's medical record regarding the extensive discussion about the rationale for splenectomy today along with a discussion regarding the risks and benefits of the entire procedure.  DESCRIPTION OF PROCEDURE:  The patient was given 5000 units of heparin subcutaneously preoperatively along with a dose of Entereg.  He was given a bowel prep the day before surgery.  After obtaining informed consent, he was taken to [facility], placed supine on the operating room table.  General endotracheal anesthesia was established.  Sequential compression devices were placed.  A Foley catheter was placed.  He was placed in lithotomy position with the appropriate padding.  His arms were tucked at his side, also with the appropriate padding.  He received cefotetan prior to skin incision.  A surgical time-out was performed.  I gained access to his abdomen in the left upper quadrant using the Optiview technique.  A small incision was made 2 fingerbreadths below the left subcostal margin and then using a 5- mm laparoscope through a 5-mm trocar, I went through all layers of the abdominal wall under direct visualization and entered the abdominal cavity.  Pneumoperitoneum was smoothly established up to a patient pressure of 15 mmHg with no change in patient's vital  signs.  Two 5 mm trocars were then placed, one in the left mid abdomen and the midclavicular line and 1 in the left lower quadrant all under direct visualization.  I then went about identifying the colonic mass to confirm it was in the ascending colon.  It was not in the hepatic flexure, it was actually in the distal ascending colon.  It was quite palpable to be a hard mass, and the tattoo was better identified.  The patient was then placed in Trendelenburg position and rotated to the left.  I lifted the small bowel up  out of the pelvis and reflected into the upper abdomen.  The peritoneum along the terminal ilium was incised lifting it off the retroperitoneum, and the right ureter was identified. The lateral attachments of the cecum were taken down with Harmonic Scalpel.  I then bluntly lifted up the right mesocolon up anteriorly from Gerota's fascia, the duodenum was identified on most medial dissection.  I was able to bluntly dissect up towards the hepatic flexure.  At this point, the patient was placed in reverse Trendelenburg.  The transverse colon was grasped and retracted downward. The gastrocolic ligament was incised with Harmonic Scalpel.  I got in the avascular plane and came down and around the hepatic flexure using Harmonic Scalpel along with blunt dissection.  The duodenum was identified up in this location.  We were then able to medialize the right colon toward the midline.  It was continued to be mobilized off Gerota's fascia and away from the duodenum.  At this point, I felt I had achieved adequate mobilization of the right colon.  At this point, Dr. Excell Seltzer had already joined me in the operating room halfway through my right colon mobilization.  He had already placed a trocar in the right lower quadrant to help with mobilization of the right colon.  We placed another trocar in the right mid abdomen under direct visualization.  At this point, the gallbladder was grasped and retracted to the right shoulder.  An epigastric, 5 mm trocar had been placed halfway through the mobilization of the right colon where we were mobilizing the hepatic flexure, so we used this as the subxiphoid trocar.  The gallbladder infundibulum was grasped, retracted laterally.  The peritoneum overlying the gallbladder was incised with hook electrocautery both medially and laterally.  The node of calot was identified.  We identified the cystic duct.  It was circumferentially dissected out and around with the aid  of a Clinical research associate.  A critical view was achieved after identifying the cystic artery.  A clip was placed across the cystic duct as it entered the gallbladder.  A ductotomy was performed and a cholangiogram catheter was threaded into the cystic duct, secured with a clip.  A cholangiogram was performed, which demonstrated a very long cystic duct emptying into the common bile duct along with the left and right hepatic ducts and common hepatic duct.  There was prompt emptying of the contrast into the duodenum.  Pneumoperitoneum was reestablished.  The cholangiogram catheter was removed.  Three clips were placed on the biliary aspect of the cystic duct.  It was then transected with Endoshears.  Two clips were placed on the downside of the cystic artery and one distally as it entered the gallbladder.  It was then transected with Endoshears.  There was a small posterior branch, which was also clipped.  We then rolled the gallbladder up at the gallbladder fossa using hook electrocautery. At this point,  the gallbladder being completely freed and it was left in the abdomen, attached to a grasper.  I felt that I had achieved enough mobilization of the right colon and transverse colon. Therefore, a small mini midline incision was made starting at the superior edge of the umbilicus extending it up about 8 cm toward the xiphoid process.  The skin was incised with a 10 blade.  The deep dermis was divided with electrocautery.  The fascia was excised, and the pneumoperitoneum was released.  The wound protector was placed.  We then passed the gallbladder up out of the wound and put it on the back table. I then delivered the right colon up out of the abdominal cavity.  I had excellent mobilization.  A window was made in the terminal ileal mesentery about 5 cm from the ileocecal valve.  The terminal ilium was then divided with a GIA 75 stapler with a blue load.  We then started taking down the mesocolon  in a serial fashion using the LigaSure device. The ileocolonic pedicle was clamped with a Kelly and transected distally with LigaSure device.  Then, a 2-0 silk tie was used to tie off the ileocolonic pedicle.  At this point, we identified the middle colic vessels in the transverse colon.  I then made a small window in the mesocolon incorporating the right branch of the middle colic vessel. The colon was then transected with another fire of the GIA 75 stapler with a blue load.  We then completed taking down the remaining mesentery with LigaSure device trying to make sure we had a good mesocolon specimen.  This freed the specimen, was passed off the field.  We then cleaned up some of the omentum off the distal transverse colon with the LigaSure device.  The small bowel was brought in a side-to-side fashion with the transverse colon ensuring that the small bowel mesentery was not twisted.  A 3-0 silk suture was placed through the end of each staple line for retraction purposes.  The enterotomy was then made just in front of each staple line one in the small bowel, one in the transverse colon to accommodate 1 fork of the GIA 75 stapler, which was placed through each enterotomy.  The stapler was brought together and fired to create a common channel.  It was widely patent.  There was no evidence of bleeding.  The common enterotomy was then closed with a TA 60 stapler with a blue load.  We then imbricated the common staple line with interrupted 2-0 silk sutures.  A 3-0 silk suture was placed in the crotch of the anastomosis.  It was widely patent and had excellent blood flow.  The mesenteric defect was left open.  The colon was returned to the abdomen.  At this point, the wound protector was removed.  We changed gown and gloves and placed new drapes, new suction tubing cautery device.  At this point, a GelPort was placed to re-establish pneumoperitoneum.  The patient was placed in reverse  Trendelenburg and rotated to the right.  The spleen was identified.  I used a sort of a hand assisted technique at times to help mobilize the spleen.  The inferior pole attachments were taken down with Harmonic Scalpel and devascularized the inferior pole of spleen.  At this point, we then turned to taking down the short gastrics.  The stomach was grasped and lifted and rotated to the right, and we entered the lesser sac by taking down the short gastrics  with the Harmonic Scalpel all the way up to the superior pole of the spleen.  We then took down some of the splenorenal attachment with Harmonic Scalpel at the superior pole.  I did place a trocar through the GelPort, and the camera was moved back and forth between the epigastric trocar and the trocar through the wound protector.  The spleen was then rotated medially, and we took down some of the posterolateral attachments along with the remaining splenocolic ligament attachments with the Harmonic Scalpel.  At this point, I was able to lift up the spleen, and I had only attached what was left for the splenic hilum.  I changed the left subcostal 5 mm trocar to a 12-mm trocar to accommodate the surgical stapler.  Then, taking an Ethicon echelon stapler, 60 mm stapler with a white load, I came across the splenic hilum with the fire.  This left 1 small attachment of the superior pole, and this was taken down with another fire of a white load of the stapler.  This freed the entire spleen.  The staple line was inspected, and it was hemostatic.  The left upper quadrant was irrigated.  Arista hemostatic agent was placed along the staple line. At this point, I removed the spleen from the midline through the [place].  At this point, the GelPort was removed, and I closed the small mini midline incision with a running looped #1 PDS, 1 from above, 1 from below.  We then reestablished pneumoperitoneum.  I inspected the fascial closure, it was air  tight.  There was nothing trapped in the closure.  I then removed the 12-mm trocar in the left upper quadrant and closed it with 2 interrupted 0 Vicryl using Endo Close technique.  Exparel was then infiltrated in the left upper quadrant, 12-mm trocar site and along the midline incision using laparoscopic guidance.  The right upper quadrant was irrigated.  Pneumoperitoneum was released.  The remaining trocars were removed.  All skin incisions were closed with a 4-0 Monocryl in subcuticular fashion with the exception of the small midline incision, which was closed with a 3-0 Monocryl in a subcuticular fashion.  Benzoin and Steri-Strips and brown Band-Aids were placed over the trocar sites.  A honeycomb dressing was placed over the small midline incision. All needle, instrument, and sponge counts were correct x2.  The patient was extubated and taken to the recovery room in stable condition.  There were no immediate complications.  The patient tolerated the procedure well.     Leighton Ruff. [name], MD, FACS     EMW/MEDQ  D:  12/27/2015  T:  12/28/2015  Job:  [id number]  cc:   Milus Banister, MD  Ladell Pier, M.D. Fax: [phone]

## 2015-12-29 LAB — BASIC METABOLIC PANEL
Anion gap: 5 (ref 5–15)
BUN: 8 mg/dL (ref 6–20)
CALCIUM: 8.4 mg/dL — AB (ref 8.9–10.3)
CHLORIDE: 107 mmol/L (ref 101–111)
CO2: 25 mmol/L (ref 22–32)
CREATININE: 0.74 mg/dL (ref 0.61–1.24)
GFR calc Af Amer: >60 mL/min (ref >60)
GFR calc non Af Amer: >60 mL/min (ref >60)
GLUCOSE: 103 mg/dL — AB (ref 65–99)
Potassium: 3.8 mmol/L (ref 3.5–5.1)
Sodium: 137 mmol/L (ref 135–145)

## 2015-12-29 LAB — CBC
HCT: 27.9 % — ABNORMAL LOW (ref 39.0–52.0)
Hemoglobin: 8.4 g/dL — ABNORMAL LOW (ref 13.0–17.0)
MCH: 23.9 pg — AB (ref 26.0–34.0)
MCHC: 30.1 g/dL (ref 30.0–36.0)
MCV: 79.3 fL (ref 78.0–100.0)
PLATELETS: 413 10*3/uL — AB (ref 150–400)
RBC: 3.52 MIL/uL — ABNORMAL LOW (ref 4.22–5.81)
RDW: 22.3 % — ABNORMAL HIGH (ref 11.5–15.5)
WBC: 14.8 10*3/uL — ABNORMAL HIGH (ref 4.0–10.5)

## 2015-12-29 MED ORDER — ONDANSETRON HCL 4 MG/2ML IJ SOLN: 4.0000 mg | Freq: Four times a day (QID) | INTRAMUSCULAR | Status: DC | PRN

## 2015-12-29 MED ORDER — MORPHINE SULFATE (PF) 2 MG/ML IV SOLN: 2.0000 mg | INTRAVENOUS | Status: DC | PRN

## 2015-12-29 MED ORDER — SODIUM CHLORIDE 0.9% FLUSH: 9.0000 mL | INTRAVENOUS | Status: DC | PRN

## 2015-12-29 MED ORDER — NALOXONE HCL 0.4 MG/ML IJ SOLN: 0.4000 mg | INTRAMUSCULAR | Status: DC | PRN

## 2015-12-29 MED ORDER — OXYCODONE HCL 5 MG PO TABS
5.0000 mg | ORAL_TABLET | ORAL | Status: DC | PRN
  Administered 2015-12-30 (×3): 5 mg via ORAL
  Administered 2015-12-30: 10 mg via ORAL
  Administered 2015-12-31: 5 mg via ORAL
  Administered 2015-12-31: 10 mg via ORAL
  Filled 2015-12-29: qty 2
  Filled 2015-12-29 (×2): qty 1
  Filled 2015-12-29: qty 2
  Filled 2015-12-29 (×2): qty 1

## 2015-12-29 MED ORDER — MORPHINE SULFATE 2 MG/ML IV SOLN
INTRAVENOUS | Status: DC
  Administered 2015-12-29: 4.5 mg via INTRAVENOUS
  Administered 2015-12-29: 2 mg via INTRAVENOUS
  Administered 2015-12-30: 4.5 mg via INTRAVENOUS
  Administered 2015-12-30: 0 mg via INTRAVENOUS
  Administered 2015-12-30: 3 mg via INTRAVENOUS
  Administered 2015-12-30 (×2): 0 mg via INTRAVENOUS
  Administered 2015-12-30: 7.5 mg via INTRAVENOUS
  Administered 2015-12-30: 4.5 mg via INTRAVENOUS
  Administered 2015-12-31: 0 mg via INTRAVENOUS
  Filled 2015-12-29: qty 25

## 2015-12-29 MED ORDER — KETOROLAC TROMETHAMINE 30 MG/ML IJ SOLN
30.0000 mg | Freq: Three times a day (TID) | INTRAMUSCULAR | Status: DC
  Administered 2015-12-29 – 2015-12-31 (×7): 30 mg via INTRAVENOUS
  Filled 2015-12-29 (×8): qty 1

## 2015-12-29 NOTE — Progress Notes (Signed)
2 Days Post-Op  Subjective: A little nausea, less burping. No flatus. Fair amount of abd wall discomfort. No itching with morphine but doesn't last long enough.   Objective: Vital signs in last 24 hours: Temp:  [98.3 F (36.8 C)-98.9 F (37.2 C)] 98.3 F (36.8 C) (06/15 0554) Pulse Rate:  [57-78] 78 (06/15 0554) Resp:  [15-21] 18 (06/15 0554) BP: (125-148)/(65-80) 148/80 mmHg (06/15 0554) SpO2:  [95 %-100 %] 99 % (06/15 0554) Last BM Date: 12/26/15  Intake/Output from previous day: 06/14 0701 - 06/15 0700 In: 5275.4 [P.O.:1920; I.V.:2985.4; IV Piggyback:370] Out: 3200 [Urine:3200] Intake/Output this shift:    Alert, sitting in chair, appears a little uncomfortable cta b/l Reg Soft, approp TTP throughout, dressing c/d/i; hypoBS,  No edema  Lab Results:   Recent Labs  12/28/15 0357 12/29/15 0422  WBC 12.1* 14.8*  HGB 8.6* 8.4*  HCT 28.5* 27.9*  PLT 373 413*   BMET  Recent Labs  12/28/15 0357 12/29/15 0422  NA 137 137  K 4.0 3.8  CL 105 107  CO2 27 25  GLUCOSE 121* 103*  BUN 9 8  CREATININE 0.85 0.74  CALCIUM 8.4* 8.4*   PT/INR No results for input(s): LABPROT, INR in the last 72 hours. ABG No results for input(s): PHART, HCO3 in the last 72 hours.  Invalid input(s): PCO2, PO2  Studies/Results: Dg Cholangiogram Operative  12/27/2015  CLINICAL DATA:  Laparoscopic cholecystectomy. EXAM: INTRAOPERATIVE CHOLANGIOGRAM FLUOROSCOPY TIME:  12 seconds COMPARISON:  PET scan - 12/22/2015 ; abdominal MRI - 12/08/2015 FINDINGS: Intraoperative cholangiographic images of the right upper abdominal quadrant during laparoscopic cholecystectomy are provided for review. Surgical clips overlie the expected location of the gallbladder fossa. Contrast injection demonstrates selective cannulation of the central aspect of the cystic duct. There is passage of contrast through the central aspect of the cystic duct with filling of a non dilated common bile duct. 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 There is passage of contrast though the CBD and into the descending portion of the duodenum. There is minimal reflux of injected contrast into the common hepatic duct and central aspect of the non dilated intrahepatic biliary system. There is minimal opacification the central aspect of the pancreatic duct which appears nondilated. There are no discrete filling defects within the opacified portions of the biliary system to suggest the presence of choledocholithiasis. IMPRESSION: No evidence of choledocholithiasis. Electronically Signed   By: Sandi Mariscal M.D.   On: 12/27/2015 10:31    Anti-infectives: Anti-infectives    Start     Dose/Rate Route Frequency Ordered Stop   12/27/15 0513  cefoTEtan (CEFOTAN) 2 g in dextrose 5 % 50 mL IVPB     2 g 100 mL/hr over 30 Minutes Intravenous On call to O.R. 12/27/15 [id number] 12/27/15 0816      Assessment/Plan: s/p Procedure(s): LAPAROSCOPIC PARTIAL RIGHT COLECTOMY (N/A) LAPAROSCOPIC CHOLECYSTECTOMY WITH INTRAOPERATIVE CHOLANGIOGRAM (N/A)  LAPAROSCOPIC SPLENECTOMY (N/A)  pulm toilet, IS, oob Pain - put back on PCA, add toradol, po pain meds as tolerated. Told to ask for pain pill once nausea improved.  Cont entereg Cont chemical vte prophylaxis Stay on clears  Leighton Ruff. [name], MD, FACS General, Bariatric, & Minimally Invasive Surgery [facility], [place]   LOS: 2 days    Troy Lambert 12/29/2015

## 2015-12-30 ENCOUNTER — Telehealth: Payer: Self-pay | Admitting: *Deleted

## 2015-12-30 LAB — BASIC METABOLIC PANEL
Anion gap: 3 — ABNORMAL LOW (ref 5–15)
BUN: 6 mg/dL (ref 6–20)
CALCIUM: 8.4 mg/dL — AB (ref 8.9–10.3)
CHLORIDE: 108 mmol/L (ref 101–111)
CO2: 27 mmol/L (ref 22–32)
CREATININE: 0.82 mg/dL (ref 0.61–1.24)
Glucose, Bld: 100 mg/dL — ABNORMAL HIGH (ref 65–99)
Potassium: 4 mmol/L (ref 3.5–5.1)
SODIUM: 138 mmol/L (ref 135–145)

## 2015-12-30 LAB — CBC
HCT: 27.3 % — ABNORMAL LOW (ref 39.0–52.0)
HEMOGLOBIN: 8.4 g/dL — AB (ref 13.0–17.0)
MCH: 24.1 pg — ABNORMAL LOW (ref 26.0–34.0)
MCHC: 30.8 g/dL (ref 30.0–36.0)
MCV: 78.4 fL (ref 78.0–100.0)
PLATELETS: 411 10*3/uL — AB (ref 150–400)
RBC: 3.48 MIL/uL — ABNORMAL LOW (ref 4.22–5.81)
RDW: 22.1 % — AB (ref 11.5–15.5)
WBC: 10.4 10*3/uL (ref 4.0–10.5)

## 2015-12-30 NOTE — Telephone Encounter (Signed)
  Oncology Nurse Navigator Documentation  Navigator Location: [facility] (12/30/15 1522) Navigator Encounter Type: Telephone (12/30/15 1522) Telephone: Jerilee Hoh Confirmation/Clarification (12/30/15 1522)   Called home # and left message with appointment to see Dr. Benay Spice on 01/10/16 at 2 pm. Requested return call confirmation to navigator #. Is still in hospital, so should print out on his discharge papers.

## 2015-12-30 NOTE — Progress Notes (Signed)
3 Days Post-Op  Subjective: Pain much better with toradol. Some nausea and little burping. No flatus. Ambulated numerous times yesterday. No issues with voiding. Doing IS  Objective: Vital signs in last 24 hours: Temp:  [98.1 F (36.7 C)-99.2 F (37.3 C)] 98.1 F (36.7 C) (06/16 0550) Pulse Rate:  [67-73] 73 (06/16 0550) Resp:  [15-20] 16 (06/16 0550) BP: (126-134)/(74-81) 126/81 mmHg (06/16 0550) SpO2:  [94 %-100 %] 95 % (06/16 0550) Last BM Date: 12/26/15  Intake/Output from previous day: 06/15 0701 - 06/16 0700 In: 3605 [P.O.:600; I.V.:2745; IV Piggyback:260] Out: 4200 [Urine:4200] Intake/Output this shift:    Awake, alert, appears much more comfortable cta b/l Reg Soft, expected mild TTP, dressings c/d/i; hypoBS, minimal to ND No edema  Lab Results:   Recent Labs  12/29/15 0422 12/30/15 0354  WBC 14.8* 10.4  HGB 8.4* 8.4*  HCT 27.9* 27.3*  PLT 413* 411*   BMET  Recent Labs  12/29/15 0422 12/30/15 0354  NA 137 138  K 3.8 4.0  CL 107 108  CO2 25 27  GLUCOSE 103* 100*  BUN 8 6  CREATININE 0.74 0.82  CALCIUM 8.4* 8.4*   PT/INR No results for input(s): LABPROT, INR in the last 72 hours. ABG No results for input(s): PHART, HCO3 in the last 72 hours.  Invalid input(s): PCO2, PO2  Studies/Results: No results found.  Anti-infectives: Anti-infectives    Start     Dose/Rate Route Frequency Ordered Stop   12/27/15 0513  cefoTEtan (CEFOTAN) 2 g in dextrose 5 % 50 mL IVPB     2 g 100 mL/hr over 30 Minutes Intravenous On call to O.R. 12/27/15 [id number] 12/27/15 0816      Assessment/Plan: s/p Procedure(s): LAPAROSCOPIC PARTIAL RIGHT COLECTOMY (N/A) LAPAROSCOPIC CHOLECYSTECTOMY WITH INTRAOPERATIVE CHOLANGIOGRAM (N/A)  LAPAROSCOPIC SPLENECTOMY (N/A)  Doing well. Awaiting return of bowel function.  Will adv diet to fulls and hold diet there until has flatus Decreased IVF last night Cont current pain regimen. Will stop PCA once has flatus Chronic anemia  - hgb stable Cont chemical vte prophylaysis.   Discussed path with pt and wife. Given copy of path report. Spleen had benign lesion. T3N0 colon cancer. Still awaiting genetic analysis.   Probable discharge over weekend after has [place]. [name], MD, FACS General, Bariatric, & Minimally Invasive Surgery [facility], [place]   LOS: 3 days    Troy Lambert 12/30/2015

## 2015-12-31 DIAGNOSIS — R161 Splenomegaly, not elsewhere classified: Secondary | ICD-10-CM | POA: Diagnosis present

## 2015-12-31 DIAGNOSIS — D638 Anemia in other chronic diseases classified elsewhere: Secondary | ICD-10-CM | POA: Diagnosis present

## 2015-12-31 DIAGNOSIS — K8020 Calculus of gallbladder without cholecystitis without obstruction: Secondary | ICD-10-CM | POA: Diagnosis present

## 2015-12-31 MED ORDER — MORPHINE SULFATE (PF) 2 MG/ML IV SOLN: 1.0000 mg | INTRAVENOUS | Status: DC | PRN

## 2015-12-31 MED ORDER — ONDANSETRON HCL 4 MG PO TABS: 4.0000 mg | ORAL_TABLET | Freq: Three times a day (TID) | ORAL | Status: DC | PRN

## 2015-12-31 MED ORDER — OXYCODONE HCL 5 MG PO TABS: 5.0000 mg | ORAL_TABLET | ORAL | Status: DC | PRN

## 2015-12-31 MED ORDER — FAMOTIDINE 20 MG PO TABS: 20.0000 mg | ORAL_TABLET | Freq: Two times a day (BID) | ORAL | Status: DC

## 2015-12-31 NOTE — Progress Notes (Signed)
Patient ID: Troy Lambert., male   DOB: [birth year], 45 y.o.   MRN: [id number] [facility] Progress Note:   4 Days Post-Op  Subjective: Mental status is clear.  Motivated and hungry to advance diet Objective: Vital signs in last 24 hours: Temp:  [97.7 F (36.5 C)-99 F (37.2 C)] 98.6 F (37 C) (06/17 0625) Pulse Rate:  [67-72] 72 (06/17 0625) Resp:  [16-20] 18 (06/17 0625) BP: (122-140)/(70-71) 122/70 mmHg (06/17 0625) SpO2:  [97 %-99 %] 97 % (06/17 0625) FiO2 (%):  [99 %] 99 % (06/16 1554)  Intake/Output from previous day: 06/16 0701 - 06/17 0700 In: 2408.8 [P.O.:1260; I.V.:1038.8; IV Piggyback:110] Out: 6073 [Urine:1451; Stool:1] Intake/Output this shift:    Physical Exam: Work of breathing is normal.  Passing BMs.    Lab Results:  Results for orders placed or performed during the hospital encounter of 12/27/15 (from the past 48 hour(s))  CBC     Status: Abnormal   Collection Time: 12/30/15  3:54 AM  Result Value Ref Range   WBC 10.4 4.0 - 10.5 K/uL   RBC 3.48 (L) 4.22 - 5.81 MIL/uL   Hemoglobin 8.4 (L) 13.0 - 17.0 g/dL   HCT 27.3 (L) 39.0 - 52.0 %   MCV 78.4 78.0 - 100.0 fL   MCH 24.1 (L) 26.0 - 34.0 pg   MCHC 30.8 30.0 - 36.0 g/dL   RDW 22.1 (H) 11.5 - 15.5 %   Platelets 411 (H) 150 - 400 K/uL  Basic metabolic panel     Status: Abnormal   Collection Time: 12/30/15  3:54 AM  Result Value Ref Range   Sodium 138 135 - 145 mmol/L   Potassium 4.0 3.5 - 5.1 mmol/L   Chloride 108 101 - 111 mmol/L   CO2 27 22 - 32 mmol/L   Glucose, Bld 100 (H) 65 - 99 mg/dL   BUN 6 6 - 20 mg/dL   Creatinine, Ser 0.82 0.61 - 1.24 mg/dL   Calcium 8.4 (L) 8.9 - 10.3 mg/dL   GFR calc non Af Amer >60 >60 mL/min   GFR calc Af Amer >60 >60 mL/min    Comment: (NOTE) The eGFR has been calculated using the CKD EPI equation. This calculation has not been validated in all clinical situations. eGFR's persistently <60 mL/min signify possible Chronic Kidney Disease.    Anion gap 3  (L) 5 - 15    Comment: RESULT REPEATED AND VERIFIED    Radiology/Results: No results found.  Anti-infectives: Anti-infectives    Start     Dose/Rate Route Frequency Ordered Stop   12/27/15 0513  cefoTEtan (CEFOTAN) 2 g in dextrose 5 % 50 mL IVPB     2 g 100 mL/hr over 30 Minutes Intravenous On call to O.R. 12/27/15 0513 12/27/15 0816      Assessment/Plan: Problem List: Patient Active Problem List   Diagnosis Date Noted  . Colon cancer ([place]) 12/27/2015    Advance to regular diet.   4 Days Post-Op    LOS: 4 days   Matt B. Hassell Done, MD, [facility] [phone] beeper [phone]  12/31/2015 8:37 AM

## 2015-12-31 NOTE — Discharge Instructions (Signed)
[facility], [place] [phone]  OPEN ABDOMINAL SURGERY: POST OP INSTRUCTIONS  Always review your discharge instruction sheet given to you by the facility where your surgery was performed.  IF YOU HAVE DISABILITY OR FAMILY LEAVE FORMS, YOU MUST BRING THEM TO THE OFFICE FOR PROCESSING.  PLEASE DO NOT GIVE THEM TO YOUR DOCTOR.  1. A prescription for pain medication may be given to you upon discharge.  Take your pain medication as prescribed, if needed.  If narcotic pain medicine is not needed, then you may take acetaminophen (Tylenol) &/or ibuprofen (Advil) as needed. 2. Take your usually prescribed medications unless otherwise directed. 3. Start low dose aspirin 81 mg per day on Monday 4. If you need a refill on your pain medication, please contact your pharmacy. They will contact our office to request authorization.  Prescriptions will not be filled after 5pm or on week-ends. 5. You should follow a light diet the first few days after arrival home, such as soup and crackers, pudding, etc.unless your doctor has advised otherwise. A high-fiber, low fat diet can be resumed as tolerated.   Be sure to include lots of fluids daily.  6. Most patients will experience some swelling and bruising in the area of the incision. Ice pack will help. Swelling and bruising can take several days to resolve..  7. It is common to experience some constipation if taking pain medication after surgery.  Increasing fluid intake and taking a stool softener will usually help or prevent this problem from occurring.  A mild laxative (Milk of Magnesia or Miralax) should be taken according to package directions if there are no bowel movements after 48 hours. 8.  You may have steri-strips (small skin tapes) in place directly over the incision.  These strips should be left on the skin for 7-10 days.   You may find that a light gauze bandage over your incision may keep your staples from being rubbed or pulled. You  may shower and replace the bandage daily. 9. ACTIVITIES:  You may resume regular (light) daily activities beginning the next day--such as daily self-care, walking, climbing stairs--gradually increasing activities as tolerated.  You may have sexual intercourse when it is comfortable.  Refrain from any heavy lifting or straining until approved by your doctor. a. You may drive when you no longer are taking prescription pain medication, you can comfortably wear a seatbelt, and you can safely maneuver your car and apply brakes b. Return to Work: ___________________________________ 50. You should see your doctor in the office for a follow-up appointment approximately two weeks after your surgery.  Make sure that you call for this appointment within a day or two after you arrive home to insure a convenient appointment time. OTHER INSTRUCTIONS:  _____________________________________________________________ _____________________________________________________________  WHEN TO CALL YOUR DOCTOR: 1. Fever over 101.0 2. Inability to urinate 3. Nausea and/or vomiting 4. Extreme swelling or bruising 5. Continued bleeding from incision. 6. Increased pain, redness, or drainage from the incision. 7. Difficulty swallowing or breathing.  The clinic staff is available to answer your questions during regular business hours.  Please dont hesitate to call and ask to speak to one of the nurses if you have concerns.  For further questions, please visit [email or web address]      Splenectomy, Long-Term Care After A splenectomy is the surgical removal of a diseased or injured spleen. The most common reasons for the spleen to be removed are because of severe injury (trauma), sickle cell disease,  or a condition that causes blood clotting problems (idiopathic thrombocytopenic purpura, ITP). The spleen is an organ located in the upper abdomen under your left ribs. It is a spongelike organ, about the size of an  orange, which acts as a filter. The spleen removes waste from the blood, maintains cells that make antibodies to help fight infection, and stores other blood cells. The spleen, along with other body systems and organs, plays an important role in the body's natural defense system (immune system). Once it is removed, there is a slightly greater chance of developing a serious, life-threatening infection (overwhelming postsplenectomy sepsis). It is important to take extra steps to prevent infection. Other precautions may be necessary to prevent blood clots from forming. PREVENTING INFECTION Your caregiver will recommend important steps to help prevent infection. These may include:  Making sure your immunizations are up to date, including:  Pneumococcus.  Seasonal flu (influenza).  Haemophilus influenzae type b (Hib).  Meningitis.  Making sure family members' vaccines are up to date.  In some cases, antibiotics may be needed if you get symptoms of infection. Not all patients need this type of treatment after a splenectomy. Talk to your caregiver about what is best for you if these symptoms develop.  Following good daily practices to prevent infection, such as:  Washing hands often, especially after preparing food, eating, changing diapers, and playing with children or animals.  Disinfecting surfaces regularly.  Avoiding others with active illness or infections.  Taking precautions to avoid mosquito and tick bites, such as:  Wearing proper clothing that covers the entire body when you are in wooded or marshy areas.  Changing clothing right away and checking for bites after you have been outside.  Using insect spray.  Using insect netting.  Staying indoors during peak mosquito hours.  Taking precautions to avoid dog bites.  You may be at increased risk for rare infections associated with dog bites after splenectomy. PREVENTING BLOOD CLOTS Splenectomy may increase your risk of  forming a blood clot. This is of utmost concern immediately after surgery. However, it may continue as a lifelong risk. To help prevent blood clots, your caregiver may recommend:  Exercising as directed. Find a safe, regular exercise program that works well for you.  Getting up, stretching, and moving around every hour if you sit a lot or do not move about much at work or during travel time.  Taking medicines (such as aspirin) as directed. TRAVEL If you travel in the [place] take care to avoid tick bites, especially in the [place] areas. Ticks can spread the parasite that causes babesiosis. Babesiosis is a flu-like illness that is treatable. If you travel abroad where malaria is common, follow these guidelines:  Contact your caregiver and discuss the places you are visiting for specific advice.  Make sure all of your immunizations are up to date.  Get specific immunizations to guard against the disease risk in the country you are visiting.  Understand how to prevent infections, such as malaria, abroad. These infections can pose serious risk. Precautions may include:  Daily tablets to prevent malaria.  Using mosquito nets.  Using insect spray.  Bring your broad-spectrum, full-strength antibiotics with you. HOME CARE INSTRUCTIONS   Take all medicines as directed. If you are prescribed antibiotics, discuss with your caregiver the use of a probiotic supplement to prevent stomach upset.  Keep track of medicine refills so that you do not run out of medicine.  Inform your close contacts of your condition.  Consider wearing a medical alert bracelet or carrying an ID card.  See your caregiver for vaccinations, follow-up exams, and testing as directed.  Follow all your caregiver's instructions on managing this and other conditions. SEEK MEDICAL CARE IF:   You have a fever.  You develop signs of infection, such as chills and feeling unwell, that continue after taking the  full-strength antibiotic.  You are considering travel abroad.  You are bitten by a tick or a dog.  You have questions or concerns. SEEK IMMEDIATE MEDICAL CARE IF:   You have chest pain along with:  Shortness of breath.  Pain in the back, neck, or jaw.  You have pain or swelling in the leg.  You develop a sudden headache and dizziness. MAKE SURE YOU:   Understand these instructions.  Will watch your condition.  Will get help right away if you are not doing well or get worse.   This information is not intended to replace advice given to you by your health care provider. Make sure you discuss any questions you have with your health care provider.   Document Released: 12/20/2009 Document Revised: 07/23/2014 Document Reviewed: 12/20/2009 Elsevier Interactive Patient Education [employer].

## 2015-12-31 NOTE — Discharge Summary (Signed)
Physician Discharge Summary  Troy Lambert. NX:[id number] DOB: Sep 28, 1970 DOA: 12/27/2015  PCP: Imelda Pillow, NP  Admit date: 12/27/2015 Discharge date: 12/31/2015  Recommendations for Outpatient Follow-up:   Follow-up Information    Follow up with Troy Curry, MD. Schedule an appointment as soon as possible for a visit on 01/20/2016.   Specialty:  General Surgery   Why:  at 3:30 PM (pls arrive 3:15) For wound re-check   Contact information:   [address] [place] [address] [phone]       Follow up with Troy Coder, MD On 01/10/2016.   Specialty:  Oncology   Why:  2pm    Contact information:   [place] [address] [phone]      Discharge Diagnoses:  Active Problems:   Colon cancer ([place]) - T3N0   Anemia of chronic disease   Symptomatic cholelithiasis s/p lap cholecystectomy   Splenic mass s/p splenectomy  Surgical Procedure: LAPAROSCOPIC RIGHT COLECTOMY, LAPAROSCOPIC CHOLECYSTECTOMY WITH CHOLANGIOGRAM, LAPAROSCOPIC SPLENECTOMY  Discharge Condition: good Disposition: home  Diet recommendation: regular  Filed Weights   12/27/15 0517  Weight: 97.07 kg (214 lb)    History of present illness: The patient is a 45 year old male who presents with colorectal cancer. He is referred by Dr Ardis Hughs for evaluation of a newly diagnosed hepatic flexure colon cancer. 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 It persisted and gradually worsened. This prompted him to seek medical attention. He was found to have significant anemia with a hemoglobin as low as 8. He was found to have iron deficiency anemia. He does not take NSAIDs or aspirin on a regular basis. This prompted a referral to gastroenterology where he underwent a colonoscopy. He underwent a colonoscopy on May 19 which showed a nonobstructing friable medium size mass at the hepatic flexure. It was partially  circumferential. It was biopsied. He also diverticuli in the left colon. The ileocecal valve was reached. Unfortunately his mass at the hepatic flexure came back positive for adenocarcinoma. His CEA level was 1.6. His most recent hemoglobin that I have was 8.4 with hematocrit of 27.6. Albumin 4.5. He underwent a CT scan of his chest abdomen and pelvis. He was found to have a 3 mm nodule in the inferior aspect of the left lower lobe which was nonspecific. He was found to have a calcified gallstone measuring 2.6 x 2.2 cm. No evidence of cholecystitis. He was found to have a 3.8 x 3.3 x 3.5 hypervascular lesion in the medial aspect of the spleen. He was also found to have the known malignancy in the hepatic flexure measuring 3.2 x 4.4 x 2.8 on CT. There is no obvious pericolonic lymphadenopathy. He underwent an MRI of his abdomen to further characterize the splenic lesion. On MRI of the lesion was 4.1 x 3.9 cm in the medial upper spleen which demonstrated heterogeneous T2 hypointensity, T1 isointensity and progressive heterogeneous enhancement. Also seen on his MRI was the known large gallstone as well as the hepatic colonic mass. The splenic lesion was still felt to be indeterminate by MRI. It could represent a benign inflammatory pseudotumor of the spleen or metastasis which would be quite unusual.  In talking with the patient he states that for the past couple months he has probably had some mild upper abdominal discomfort mainly in the epigastrium and right upper quadrant especially after eating a greasy meal. He  denies any family history of abdominal cancers. He does not smoke. His cardiac review of systems is negative. He does dip. He has also had some issues with plantar fasciitis.   Hospital Course:  He underwent the above mentioned procedures. Splenectomy was recommended bc all imaging was indeterminate. He was bowel prepped the day before, recd preop heparin and was maintained on  perioperative entereg for bowel recovery. He did not receive any blood transfusion during hospital stay. He was started on prophylactic lovenox on POD 1, foley out on POD 1 and started clears. PCA for pain. He had itching with dilaudid so he was switched to morphine. tordaol was started on POD 2 for additional pain control. He was advanced to fulls and held there until he had bowel function which started late evening POD 3. We discussed his path report on POD 3 and he was a given a copy. T3N0 cancer, splenic mass was benign. On POD 4, he had tolerated 2 solid meals, had had several BMs, ambulating well, stable vitals, no nausea. He was deemed stable for dc. I discussed dc instructions with him and wife.    Discharge Instructions      Discharge Instructions    Call MD for:  hives    Complete by:  As directed      Call MD for:  persistant dizziness or light-headedness    Complete by:  As directed      Call MD for:  persistant nausea and vomiting    Complete by:  As directed      Call MD for:  redness, tenderness, or signs of infection (pain, swelling, redness, odor or green/yellow discharge around incision site)    Complete by:  As directed      Call MD for:  severe uncontrolled pain    Complete by:  As directed      Call MD for:    Complete by:  As directed   Temperature >101     Diet general    Complete by:  As directed      Discharge instructions    Complete by:  As directed   See [facility] discharge instructions     Driving Restrictions    Complete by:  As directed   No driving while taking narcotics during daytime     Increase activity slowly    Complete by:  As directed      Lifting restrictions    Complete by:  As directed   No lifting, pushing, pulling anything greater than 10 lbs for 6 weeks            Medication List    STOP taking these medications        diclofenac 75 MG EC tablet  Commonly known as:  VOLTAREN     metroNIDAZOLE 500 MG tablet  Commonly known as:   FLAGYL     neomycin 500 MG tablet  Commonly known as:  MYCIFRADIN     OVER THE COUNTER MEDICATION      TAKE these medications        ferrous sulfate 325 (65 FE) MG tablet  Commonly known as:  IRON SUPPLEMENT  Take 1 tablet (325 mg total) by mouth 2 (two) times daily with a meal.     loratadine 10 MG tablet  Commonly known as:  CLARITIN  Take 10 mg by mouth daily as needed for allergies.     ondansetron 4 MG tablet  Commonly known as:  ZOFRAN  Take 1  tablet (4 mg total) by mouth every 8 (eight) hours as needed for nausea or vomiting.     oxyCODONE 5 MG immediate release tablet  Commonly known as:  Oxy IR/ROXICODONE  Take 1-2 tablets (5-10 mg total) by mouth every 4 (four) hours as needed for moderate pain.     zolpidem 10 MG tablet  Commonly known as:  AMBIEN  Take 10 mg by mouth at bedtime as needed for sleep.       Follow-up Information    Follow up with Troy Curry, MD. Schedule an appointment as soon as possible for a visit on 01/20/2016.   Specialty:  General Surgery   Why:  at 3:30 PM (pls arrive 3:15) For wound re-check   Contact information:   [address] [place] [address] [phone]       Follow up with Troy Coder, MD On 01/10/2016.   Specialty:  Oncology   Why:  2pm    Contact information:   [place] [address] [phone]        The results of significant diagnostics from this hospitalization (including imaging, microbiology, ancillary and laboratory) are listed below for reference.    Significant Diagnostic Studies: Dg Cholangiogram Operative  12/27/2015  CLINICAL DATA:  Laparoscopic cholecystectomy. EXAM: INTRAOPERATIVE CHOLANGIOGRAM FLUOROSCOPY TIME:  12 seconds COMPARISON:  PET scan - 12/22/2015 ; abdominal MRI - 12/08/2015 FINDINGS: Intraoperative cholangiographic images of the right upper abdominal quadrant during laparoscopic cholecystectomy are provided for review. Surgical clips overlie the  expected location of the gallbladder fossa. Contrast injection demonstrates selective cannulation of the central aspect of the cystic duct. There is passage of contrast through the central aspect of the cystic duct with filling of a non dilated common bile duct. 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 There is passage of contrast though the CBD and into the descending portion of the duodenum. There is minimal reflux of injected contrast into the common hepatic duct and central aspect of the non dilated intrahepatic biliary system. There is minimal opacification the central aspect of the pancreatic duct which appears nondilated. There are no discrete filling defects within the opacified portions of the biliary system to suggest the presence of choledocholithiasis. IMPRESSION: No evidence of choledocholithiasis. Electronically Signed   By: Sandi Mariscal M.D.   On: 12/27/2015 10:31   Ct Chest W Contrast  12/05/2015  CLINICAL DATA:  45 year old male with newly diagnosed colon cancer. Lethargy for approximately 6 months. Anemia. Evaluate for metastatic disease. EXAM: CT CHEST, ABDOMEN, AND PELVIS WITH CONTRAST TECHNIQUE: Multidetector CT imaging of the chest, abdomen and pelvis was performed following the standard protocol during bolus administration of intravenous contrast. CONTRAST:  131mL ISOVUE-300 IOPAMIDOL (ISOVUE-300) INJECTION 61% COMPARISON:  No priors. FINDINGS: CT CHEST FINDINGS Mediastinum/Lymph Nodes: Heart size is normal.There is no significant pericardial fluid, thickening or pericardial calcification. No pathologically enlarged mediastinal or hilar lymph nodes. Esophagus is unremarkable in appearance. No axillary lymphadenopathy. Lungs/Pleura: 3 mm nodule in the inferior aspect of the left lower lobe (image 119 of series 3) is highly nonspecific. No other larger more suspicious appearing pulmonary nodules or masses are otherwise noted. There is no acute  consolidative airspace disease. No pleural effusions. Musculoskeletal/Soft Tissues: There are no aggressive appearing lytic or blastic lesions noted in the visualized portions of the skeleton. CT ABDOMEN AND PELVIS FINDINGS Hepatobiliary: No suspicious cystic or solid hepatic lesions. No intra or extrahepatic biliary  ductal dilatation. Calcified gallstones are noted within the lumen of the gallbladder measuring up to 2.6 x 2.2 cm. No findings to suggest an acute cholecystitis at this time. Pancreas: No pancreatic mass. No pancreatic ductal dilatation. No pancreatic or peripancreatic fluid or inflammatory changes. Spleen: 3.8 x 3.3 x 3.5 cm hypovascular lesion in the medial aspect of the spleen is incompletely characterized, but appears to enhance in a similar fashion to the remainder of the spleen on delayed images. Adrenals/Urinary Tract: Bilateral adrenal glands and bilateral kidneys are normal in appearance. No hydroureteronephrosis or perinephric stranding to indicate urinary tract obstruction at this time. Urinary bladder is normal in appearance. Stomach/Bowel: Normal appearance of the stomach. No pathologic dilatation of small bowel or colon. Numerous colonic diverticulae are noted, without surrounding inflammatory changes to suggest an acute diverticulitis at this time. In the region of the hepatic flexure (axial image 88 of series 2 and coronal image 49 of series 6) there is an eccentric mass-like area measuring 3.2 x 4.4 x 2.8 cm, highly concerning for primary colonic neoplasm. No definite extension beyond the colonic wall is identified. Pericolonic fat appears well-defined in this region. No pericolonic lymphadenopathy is noted. The appendix is not confidently identified may be surgically absent. Regardless, there are no inflammatory changes noted adjacent to the cecum to suggest the presence of an acute appendicitis at this time. Vascular/Lymphatic: Minimal atherosclerosis is noted in the abdominal and  pelvic vasculature, without evidence of aneurysm or dissection. No lymphadenopathy noted in the abdomen or pelvis. Reproductive: Prostate gland and seminal vesicles are unremarkable in appearance. Other: No significant volume of ascites.  No pneumoperitoneum. Musculoskeletal: There are no aggressive appearing lytic or blastic lesions noted in the visualized portions of the skeleton. IMPRESSION: 1. 3.2 x 4.4 x 2.8 cm lesion in the hepatic flexure of the colon, highly concerning for primary colonic neoplasm. No definite evidence of metastatic disease is identified in the chest, abdomen or pelvis. 2. There is an indeterminate splenic lesion. This is relatively well-defined, and has an appearance suggestive of a splenic hemangioma. The possibility of a metastatic lesion the spleen is not entirely excluded, however, and further evaluation with MRI of the abdomen with and without IV gadolinium is suggested at this time. 3. Additionally, there is a 3 mm nodule in the left lower lobe. This is highly nonspecific and statistically benign, however, attention on followup studies is recommended to ensure stability. 4. Cholelithiasis without evidence of acute cholecystitis at this time. 5. Colonic diverticulosis without evidence of acute diverticulitis at this time. Electronically Signed   By: Vinnie Langton M.D.   On: 12/05/2015 10:49   Mr Abdomen W Wo Contrast  12/08/2015  CLINICAL DATA:  45 year old male with newly diagnosed colon cancer and indeterminate splenic mass on staging CT. EXAM: MRI ABDOMEN WITHOUT AND WITH CONTRAST TECHNIQUE: Multiplanar multisequence MR imaging of the abdomen was performed both before and after the administration of intravenous contrast. CONTRAST:  57mL MULTIHANCE GADOBENATE DIMEGLUMINE 529 MG/ML IV SOLN COMPARISON:  12/05/2015 CT chest, abdomen and pelvis. FINDINGS: Lower chest: Clear lung bases. Hepatobiliary: Normal liver size and configuration. No hepatic steatosis. No liver mass. Solitary  2.5 cm gallstone in the gallbladder. No gallbladder wall thickening or pericholecystic fluid. No biliary ductal dilatation. Common bile duct diameter 4 mm. No choledocholithiasis. Pancreas: No pancreatic mass or duct dilation.  No pancreas divisum. Spleen: Normal size spleen. There is a solitary 4.1 x 3.9 cm mass in the medial upper spleen (series 1105/ image 54), which  demonstrates heterogeneous T2 hypointensity, T1 isointensity and progressive heterogeneous enhancement. No additional splenic mass. Adrenals/Urinary Tract: Normal adrenals. No hydronephrosis. Normal kidneys with no renal mass. Stomach/Bowel: Grossly normal stomach. Visualized small and large bowel is normal caliber, with no small bowel wall thickening. Re- demonstrated is an annular 4.8 x 2.7 cm colonic mass in the ascending colon (series 12/image 45). Vascular/Lymphatic: Normal caliber abdominal aorta. Patent portal, splenic, hepatic and renal veins. No pathologically enlarged lymph nodes in the abdomen. Other: No abdominal ascites or focal fluid collection. Musculoskeletal: No aggressive appearing focal osseous lesions. IMPRESSION: 1. Annular 4.8 x 2.7 cm colonic mass in the ascending colon, consistent with primary colonic adenocarcinoma. 2. Solitary 4.1 cm progressively enhancing splenic mass, which is indeterminate by MRI. An isolated solitary splenic metastasis is unusual but cannot be excluded. This mass could represent a benign inflammatory pseudotumor of the spleen, although this is a rare diagnosis. The low T2 signal of this mass is not consistent with other more common benign splenic masses such as hemangioma, lymphangioma or hamartoma. PET-CT may be useful for further evaluation, although both splenic metastases and benign inflammatory pseudotumors can be FDG-avid per case reports in the literature. Close imaging surveillance with MRI abdomen without and with IV contrast is recommended if splenectomy / biopsy is not performed. 3. No  abdominal lymphadenopathy.  No liver metastases. 4. Cholelithiasis. Electronically Signed   By: Ilona Sorrel M.D.   On: 12/08/2015 10:22   Ct Abdomen Pelvis W Contrast  12/05/2015  CLINICAL DATA:  45 year old male with newly diagnosed colon cancer. Lethargy for approximately 6 months. Anemia. Evaluate for metastatic disease. EXAM: CT CHEST, ABDOMEN, AND PELVIS WITH CONTRAST TECHNIQUE: Multidetector CT imaging of the chest, abdomen and pelvis was performed following the standard protocol during bolus administration of intravenous contrast. CONTRAST:  136mL ISOVUE-300 IOPAMIDOL (ISOVUE-300) INJECTION 61% COMPARISON:  No priors. FINDINGS: CT CHEST FINDINGS Mediastinum/Lymph Nodes: Heart size is normal.There is no significant pericardial fluid, thickening or pericardial calcification. No pathologically enlarged mediastinal or hilar lymph nodes. Esophagus is unremarkable in appearance. No axillary lymphadenopathy. Lungs/Pleura: 3 mm nodule in the inferior aspect of the left lower lobe (image 119 of series 3) is highly nonspecific. No other larger more suspicious appearing pulmonary nodules or masses are otherwise noted. There is no acute consolidative airspace disease. No pleural effusions. Musculoskeletal/Soft Tissues: There are no aggressive appearing lytic or blastic lesions noted in the visualized portions of the skeleton. CT ABDOMEN AND PELVIS FINDINGS Hepatobiliary: No suspicious cystic or solid hepatic lesions. No intra or extrahepatic biliary ductal dilatation. Calcified gallstones are noted within the lumen of the gallbladder measuring up to 2.6 x 2.2 cm. No findings to suggest an acute cholecystitis at this time. Pancreas: No pancreatic mass. No pancreatic ductal dilatation. No pancreatic or peripancreatic fluid or inflammatory changes. Spleen: 3.8 x 3.3 x 3.5 cm hypovascular lesion in the medial aspect of the spleen is incompletely characterized, but appears to enhance in a similar fashion to the remainder  of the spleen on delayed images. Adrenals/Urinary Tract: Bilateral adrenal glands and bilateral kidneys are normal in appearance. No hydroureteronephrosis or perinephric stranding to indicate urinary tract obstruction at this time. Urinary bladder is normal in appearance. Stomach/Bowel: Normal appearance of the stomach. No pathologic dilatation of small bowel or colon. Numerous colonic diverticulae are noted, without surrounding inflammatory changes to suggest an acute diverticulitis at this time. In the region of the hepatic flexure (axial image 88 of series 2 and coronal image 49 of series 6)  there is an eccentric mass-like area measuring 3.2 x 4.4 x 2.8 cm, highly concerning for primary colonic neoplasm. No definite extension beyond the colonic wall is identified. Pericolonic fat appears well-defined in this region. No pericolonic lymphadenopathy is noted. The appendix is not confidently identified may be surgically absent. Regardless, there are no inflammatory changes noted adjacent to the cecum to suggest the presence of an acute appendicitis at this time. Vascular/Lymphatic: Minimal atherosclerosis is noted in the abdominal and pelvic vasculature, without evidence of aneurysm or dissection. No lymphadenopathy noted in the abdomen or pelvis. Reproductive: Prostate gland and seminal vesicles are unremarkable in appearance. Other: No significant volume of ascites.  No pneumoperitoneum. Musculoskeletal: There are no aggressive appearing lytic or blastic lesions noted in the visualized portions of the skeleton. IMPRESSION: 1. 3.2 x 4.4 x 2.8 cm lesion in the hepatic flexure of the colon, highly concerning for primary colonic neoplasm. No definite evidence of metastatic disease is identified in the chest, abdomen or pelvis. 2. There is an indeterminate splenic lesion. This is relatively well-defined, and has an appearance suggestive of a splenic hemangioma. The possibility of a metastatic lesion the spleen is not  entirely excluded, however, and further evaluation with MRI of the abdomen with and without IV gadolinium is suggested at this time. 3. Additionally, there is a 3 mm nodule in the left lower lobe. This is highly nonspecific and statistically benign, however, attention on followup studies is recommended to ensure stability. 4. Cholelithiasis without evidence of acute cholecystitis at this time. 5. Colonic diverticulosis without evidence of acute diverticulitis at this time. Electronically Signed   By: Vinnie Langton M.D.   On: 12/05/2015 10:49   Nm Pet Image Initial (pi) Skull Base To Thigh  12/22/2015  CLINICAL DATA:  Initial treatment strategy for colon cancer. EXAM: NUCLEAR MEDICINE PET SKULL BASE TO THIGH TECHNIQUE: 11.6 mCi F-18 FDG was injected intravenously. Full-ring PET imaging was performed from the skull base to thigh after the radiotracer. CT data was obtained and used for attenuation correction and anatomic localization. FASTING BLOOD GLUCOSE:  Value: 98 mg/dl COMPARISON:  MR abdomen 12/08/2015 and CT chest abdomen pelvis 12/05/2015. FINDINGS: NECK No hypermetabolic lymph nodes in the neck. CT images show no acute findings. CHEST No hypermetabolic mediastinal, hilar or axillary lymph nodes. No hypermetabolic pulmonary nodules. There is decreased attenuation of the intravascular compartment, indicative of anemia. No pericardial or pleural effusion. ABDOMEN/PELVIS Ascending colon mass (CT image 153) has an SUV max of 26.4. No hypermetabolic lymph nodes. Minimally hyperdense lesion in the spleen is seen medially, measuring 3.3 cm with an SUV max of 5.5. No abnormal hypermetabolism in the liver, adrenal glands or pancreas. CT images show the liver to be grossly unremarkable. Large stone in the gallbladder. Adrenal glands, kidneys, spleen, pancreas, stomach and small bowel are otherwise unremarkable. There is mild haziness and stranding adjacent to a prominent diverticulum off the proximal sigmoid colon  (series 4, image 183) with associated hypermetabolism. Inflammatory findings are new from 12/05/2015. SKELETON No abnormal osseous hypermetabolism. IMPRESSION: 1. Hypermetabolic ascending colon mass, consistent with the given history of colon carcinoma. 2. Mildly hypermetabolic lesion in the medial spleen remains indeterminate. As discussed on 12/08/2015, splenic metastasis and benign inflammatory pseudotumor can have this appearance and close imaging surveillance with MRI abdomen without and with contrast is recommended if tissue sampling or resection is not performed. 3. Otherwise, no evidence of metastatic disease. 4. Suspect mild sigmoid diverticulitis. 5. Cholelithiasis. Electronically Signed   By: Rip Harbour  Blietz M.D.   On: 12/22/2015 15:02    Microbiology: No results found for this or any previous visit (from the past 240 hour(s)).   Labs: Basic Metabolic Panel:  Recent Labs Lab 12/28/15 0357 12/29/15 0422 12/30/15 0354  NA 137 137 138  K 4.0 3.8 4.0  CL 105 107 108  CO2 27 25 27   GLUCOSE 121* 103* 100*  BUN 9 8 6   CREATININE 0.85 0.74 0.82  CALCIUM 8.4* 8.4* 8.4*   Liver Function Tests: No results for input(s): AST, ALT, ALKPHOS, BILITOT, PROT, ALBUMIN in the last 168 hours. No results for input(s): LIPASE, AMYLASE in the last 168 hours. No results for input(s): AMMONIA in the last 168 hours. CBC:  Recent Labs Lab 12/27/15 1241 12/28/15 0357 12/29/15 0422 12/30/15 0354  WBC 29.1* 12.1* 14.8* 10.4  HGB 9.7* 8.6* 8.4* 8.4*  HCT 31.6* 28.5* 27.9* 27.3*  MCV 78.0 77.7* 79.3 78.4  PLT 451* 373 413* 411*   Cardiac Enzymes: No results for input(s): CKTOTAL, CKMB, CKMBINDEX, TROPONINI in the last 168 hours. BNP: BNP (last 3 results) No results for input(s): BNP in the last 8760 hours.  ProBNP (last 3 results) No results for input(s): PROBNP in the last 8760 hours.  CBG: No results for input(s): GLUCAP in the last 168 hours.  Active Problems:   Colon cancer  ([place])   Anemia of chronic disease   Symptomatic cholelithiasis   Splenic mass   Time coordinating discharge: 25 minutes  Signed:  [name], MD [facility], [place] [phone] 12/31/2015, 6:10 PM

## 2015-12-31 NOTE — Progress Notes (Signed)
Pt discharged to home with dc paper work and prescription for pain meds, escorted to lobby in wheel chair by nursing tech

## 2016-01-04 ENCOUNTER — Telehealth: Payer: Self-pay | Admitting: *Deleted

## 2016-01-04 ENCOUNTER — Encounter (HOSPITAL_COMMUNITY): Payer: Self-pay

## 2016-01-04 NOTE — Telephone Encounter (Signed)
Left VM for patient to call back and ask for HIM department to confirm his appointment with Dr. Benay Spice on 01/10/16 at 2pm.

## 2016-01-10 ENCOUNTER — Other Ambulatory Visit: Payer: Self-pay | Admitting: *Deleted

## 2016-01-10 ENCOUNTER — Ambulatory Visit (HOSPITAL_BASED_OUTPATIENT_CLINIC_OR_DEPARTMENT_OTHER): Payer: BC Managed Care – PPO | Admitting: Oncology

## 2016-01-10 ENCOUNTER — Telehealth: Payer: Self-pay | Admitting: Oncology

## 2016-01-10 ENCOUNTER — Ambulatory Visit (HOSPITAL_COMMUNITY)
Admission: RE | Admit: 2016-01-10 | Discharge: 2016-01-10 | Disposition: A | Discharge status: Home / Self Care | Payer: BC Managed Care – PPO | Source: Ambulatory Visit | Attending: Oncology | Admitting: Oncology

## 2016-01-10 VITALS — BP 144/73 | HR 74 | Temp 98.4°F | Resp 18 | Ht 75.0 in | Wt 206.0 lb

## 2016-01-10 DIAGNOSIS — C182 Malignant neoplasm of ascending colon: Secondary | ICD-10-CM

## 2016-01-10 DIAGNOSIS — I82401 Acute embolism and thrombosis of unspecified deep veins of right lower extremity: Secondary | ICD-10-CM

## 2016-01-10 DIAGNOSIS — D500 Iron deficiency anemia secondary to blood loss (chronic): Secondary | ICD-10-CM | POA: Diagnosis not present

## 2016-01-10 DIAGNOSIS — Z720 Tobacco use: Secondary | ICD-10-CM

## 2016-01-10 HISTORY — DX: Acute embolism and thrombosis of unspecified deep veins of right lower extremity: I82.401

## 2016-01-10 MED ORDER — OXYCODONE HCL 5 MG PO TABS: 5.0000 mg | ORAL_TABLET | Freq: Two times a day (BID) | ORAL | Status: DC | PRN

## 2016-01-10 MED ORDER — RIVAROXABAN (XARELTO) VTE STARTER PACK (15 & 20 MG): ORAL_TABLET | ORAL | Status: DC

## 2016-01-10 NOTE — Progress Notes (Signed)
[facility] New Patient Consult   Referring MD: Kutler Vanvranken. 45 y.o.  06-Oct-1970    Reason for Referral: Colon cancer   HPI: He developed fatigue and exertional dyspnea over a few months. He was seen by his primary physician and diagnosed with anemia. He was referred to Dr. Ardis Hughs and was taken to a colonoscopy on 12/02/2015. A nonobstructing mass was found at the hepatic flexure. The mass was biopsied in the area was tattooed. An 8 mm polyp was noted 1 cm distal to the mass. This was not removed. Diverticula were noted in the left colon. The pathology [id number]) confirmed adenocarcinoma.  Troy Lambert was referred for CTs of the chest, abdomen, and pelvis on 12/05/2015. A 3 mm nodule was noted in the left lower lobe. No other nodules. No liver lesions. Calcified gallstones. A 3.8 cm hypovascular lesion was noted in the medial spleen. A mass was noted at the hepatic flexure concerning for a primary colonic neoplasm. No pericolonic lymphadenopathy. No lymphadenopathy in the abdomen or pelvis. He was referred for an MRI of the abdomen on 12/08/2015. A solitary mass was noted in the medial spleen. An annular mass was noted in the ascending colon. A PET scan 12/22/2015 revealed a hypermetabolic ascending colonic mass. No hypermetabolic lymph nodes. The spleen lesion had a SUV max of 5.5.  He was referred to Dr. [name]. After discussion with the patient and colleagues regarding the spleen lesion a decision was made to proceed with a right colectomy and splenectomy on 12/27/2015. A cholecystectomy was also performed. The mass was identified in the distal ascending colon. The tattoo was identified. The pathology [id number]) confirmed chronic cholecystitis. The spleen revealed a benign vascular proliferation. The proximal a sending colon tumor was confirmed to represent an invasive adenocarcinoma extending into pericolonic connective tissue. The tumor was a grade 2  lesion. No lymphovascular or perineural invasion. No tumor deposits. The resection margins were negative. No additional polyps. 32 lymph nodes were negative for metastatic carcinoma. No loss of mismatch repair protein expression, the tumor is MSI-stable.  He continues to have abdominal discomfort with certain movements following surgery.  Past Medical History  Diagnosis Date  . Allergy   . Anemia April 2017     iron def anemia  . Cancer ([place])     sqamous cell, basal cell skin cancers. Colon Cancer dx. surgery planned  . Diverticulosis     never a problem    .   Plantar fasciitis  Past Surgical History  Procedure Laterality Date  . Tonsillectomy    . Wisdom tooth extraction    . Vasectomy    . Laparoscopic partial colectomy N/A 12/27/2015    Procedure: LAPAROSCOPIC PARTIAL RIGHT COLECTOMY;  Surgeon: Greer Pickerel, MD;  Location: [facility];  Service: General;  Laterality: N/A;  . Cholecystectomy N/A 12/27/2015    Procedure: LAPAROSCOPIC CHOLECYSTECTOMY WITH INTRAOPERATIVE CHOLANGIOGRAM;  Surgeon: Greer Pickerel, MD;  Location: [facility];  Service: General;  Laterality: N/A;  . Laparoscopic splenectomy N/A 12/27/2015    Procedure:  LAPAROSCOPIC SPLENECTOMY;  Surgeon: Greer Pickerel, MD;  Location: [facility];  Service: General;  Laterality: N/A;    Medications: Reviewed  Allergies:  Allergies  Allergen Reactions  . Dilaudid [Hydromorphone Hcl] Itching    Family history: His father died of lung cancer and was a smoker. No other family history of cancer. He has one sister and 2 children.  Social History:   He is a Associate Professor  Garment/textile technologist. He does not use cigarettes. He uses snuff. He reports moderate alcohol use. No transfusion history. No risk factor for HIV or hepatitis.  History  Alcohol Use  . 0.0 oz/week  . 0 Standard drinks or equivalent per week    Comment: a couple of beers a night; 4-5 on weekend    History  Smoking status  . Never Smoker   Smokeless tobacco  . Current User  . Types:  Snuff    Comment: pt used smokless tobacco      ROS:   Positives include:Malaise, exertional dyspnea, pain in the right calf for the past few days, constipation/diarrhea for a few months prior to surgery  A complete ROS was otherwise negative.  Physical Exam:  Blood pressure 144/73, pulse 74, temperature 98.4 F (36.9 C), temperature source Oral, resp. rate 18, height '6\' 3"'  (1.905 m), weight 206 lb (93.441 kg), SpO2 100 %.  HEENT: Oropharynx without visible mass, neck without mass Lungs: Clear bilaterally Cardiac: Regular rate and rhythm Abdomen: No hepatosplenomegaly, no mass, healed surgical incisions GU: Testes without mass  Vascular: Mild enlargement of the right calf and lower leg compared to the left side, no erythema Lymph nodes: No cervical, supraclavicular, axillary, or inguinal nodes Neurologic: Alert and oriented, the motor exam appears intact in the upper and lower extremities Skin: Tattoos, no rash   LAB:  CBC  Lab Results  Component Value Date   WBC 10.4 12/30/2015   HGB 8.4* 12/30/2015   HCT 27.3* 12/30/2015   MCV 78.4 12/30/2015   PLT 411* 12/30/2015   NEUTROABS 5.1 12/21/2015     CMP      Component Value Date/Time   NA 138 12/30/2015 0354   K 4.0 12/30/2015 0354   CL 108 12/30/2015 0354   CO2 27 12/30/2015 0354   GLUCOSE 100* 12/30/2015 0354   BUN 6 12/30/2015 0354   CREATININE 0.82 12/30/2015 0354   CALCIUM 8.4* 12/30/2015 0354   PROT 7.6 12/21/2015 1330   ALBUMIN 4.7 12/21/2015 1330   AST 27 12/21/2015 1330   ALT 29 12/21/2015 1330   ALKPHOS 68 12/21/2015 1330   BILITOT 0.5 12/21/2015 1330   GFRNONAA >60 12/30/2015 0354   GFRAA >60 12/30/2015 0354   CEA on 12/02/2015-1.6   Imaging: CT 12/05/2015-images reviewed   Assessment/Plan:   1. Ascending colon cancer, stage II (T3 N0), G2, status post a right colectomy 12/27/2015  MSI-stable, no loss of mismatch repair protein expression  0/32 lymph nodes involved with metastatic  carcinoma  2. 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   Iron deficiency anemia secondary to #1  4.   Right lower extremity deep vein thrombosis confirmed on a Doppler ultrasound 01/10/2016-started Xarelto  5.   Smokeless tobacco use   Disposition:   Troy Lambert has been diagnosed with stage II colon cancer. I reviewed the details of the surgical pathology report with him. We discussed the prognosis and adjuvant treatment options. His tumor does not have "high-risk "features. He has an excellent prognosis for a long-term disease-free survival. We discussed the small potential absolute benefit from adjuvant chemotherapy. I do not recommend chemotherapy. He is comfortable with observation.  We discussed diet and exercise maneuvers that may decrease the risk of developing colon cancer. He does not appear to have a hereditary colon cancer syndrome, but his family members are at increased risk of developing colon cancer. He will be sure they have appropriate screening.  He complained of pain at  the right calf and was noted to have swelling on exam today. A Doppler confirmed a right lower extremity deep vein thrombosis. We discussed the risk of anticoagulation therapy. He will begin Xarelto.  Troy Lambert will return for an office visit in 3 weeks to follow-up on the iron deficiency anemia and deep vein thrombosis.  He is currently taking aspirin. We discussed the potential decreased risk of developing a new colon cancer or recurrent colon cancer while taking aspirin. This is not a "standard "recommendation. I do not recommend aspirin while he is taking Xarelto.  Approximately 50 minutes were spent with the patient today. The majority of the time was used for counseling and coordination of care.  [name], MD  01/10/2016, 2:34 PM

## 2016-01-10 NOTE — Progress Notes (Signed)
*  PRELIMINARY RESULTS* Vascular Ultrasound Lower extremity venous duplex has been completed.  Preliminary findings: DVT noted in the right gastroc veins and right posterior tibial veins. No DVT LLE.   Called Dr. Gearldine Shown office at 4:35 pm. Office is closed. Spoke with RN who will send page out to on call physician. 4:42 pm Received call from [place]. She will page Dr. Benay Spice with results. Patient had indicated that MD was going to send in prescription for Xarelto once he received these results. Per Manuela Schwartz, patient ok to leave and to follow up at pharmacy. Manuela Schwartz will page Dr. Benay Spice now and make sure prescription is sent in for patient. Instructions relayed to patient.    Landry Mellow, RDMS, RVT  01/10/2016, 4:33 PM

## 2016-01-10 NOTE — Telephone Encounter (Signed)
left msg confirming 12/19 apt

## 2016-01-11 ENCOUNTER — Telehealth: Payer: Self-pay | Admitting: *Deleted

## 2016-01-11 ENCOUNTER — Other Ambulatory Visit: Payer: Self-pay | Admitting: *Deleted

## 2016-01-11 ENCOUNTER — Telehealth: Payer: Self-pay | Admitting: Oncology

## 2016-01-11 DIAGNOSIS — C182 Malignant neoplasm of ascending colon: Secondary | ICD-10-CM

## 2016-01-11 NOTE — Telephone Encounter (Signed)
"  I missed a call from the doctor's nurse."

## 2016-01-11 NOTE — Telephone Encounter (Signed)
Patient's wife notified per Dr. Benay Spice that pt does not need to remain immobile and that Xarelto begins working one hour after taking medication and for patient to not do any heavy lifting or strenuous work.  Pt.'s wife verbalized an understanding of information and is appreciative of call.

## 2016-01-11 NOTE — Telephone Encounter (Signed)
S.w. Pt and advised on July appt....the patient ok and aware

## 2016-01-11 NOTE — Telephone Encounter (Signed)
Call placed to pt to inform him to stop Aspirin per Dr. Benay Spice and to notify him that scheduling will be calling him to schedule appt in 3 weeks for lab and visit with Dr. Benay Spice or L. Marcello Moores NP.  Pt asking how long he should remain immobile d/t DVT.  Pt informed that I will ask Dr. Benay Spice and call him back with MD instructions.  Pt appreciative of call.

## 2016-01-11 NOTE — Telephone Encounter (Signed)
"  I have questions about my rt leg.  It's swollen with a lot of pressure/pain.  I can't get from point a to point B so I need to know if it's okay to get up with crutches.  Using urinal but I need to get to the bathroom.  Can I walk to the couch?  Should I be wearing pressure socks/hose?  Return number is [phone]."  This nurse advised he elevate leg whenever possible.  Expect return call about pressure socks.

## 2016-01-11 NOTE — Telephone Encounter (Signed)
Call transferred to desk via triage.  Pt informed that per Dr. Benay Spice, he may use support stockings and heat for discomfort to leg.  Pt instructed that he may use crutches if needed for ambulation d/t pain and to keep leg elevated as often as he is able.  Pt informed per Dr. Benay Spice that pain should decrease in leg in 2-3 days.  Pt agreeable to genetic referral per request of Dr. Benay Spice.  Pt has no further questions at this time and was encouraged to call back with any other concerns.

## 2016-01-12 ENCOUNTER — Encounter: Payer: Self-pay | Admitting: Surgery

## 2016-01-12 ENCOUNTER — Telehealth: Payer: Self-pay | Admitting: *Deleted

## 2016-01-12 NOTE — Telephone Encounter (Signed)
Call placed to check on patient's status.  Patient states that he saw Dr. [name] this afternoon and he prescribed him a muscle relaxer for the strained muscle in his abdomen from using his crutches.  Pt reminded to rest and keep leg elevated as much as possible.  Patient appreciative of call and has no further questions or concerns at this time.

## 2016-01-12 NOTE — Progress Notes (Unsigned)
Pt called with increasing abdominal pain after right colectomy Has recently been diagnosed with a DVT Was using crutches to get around but developed right sided abdominal pain  No fever or chills Given hx of DVT and increasing pain,  I recommended he go to ED for evaluation Pt agreed

## 2016-01-12 NOTE — Telephone Encounter (Signed)
Wife called wanting to inform MD Benay Spice about recent accident. Patient recently had surgery to gallbladder and spleen. Patient currently on crutches and with crutches accidentally pulled right calf/leg muscle. Wife recently notified the surgeon MD Greer Pickerel for some type of muscle relaxer and still awaiting return call. They wanted to notify MD Benay Spice just in case they can not get in touch with surgeon that they can possible be seen or receive medication from him. Message sent to RN Lorriane Shire.

## 2016-01-20 ENCOUNTER — Other Ambulatory Visit: Payer: Self-pay | Admitting: Pharmacist

## 2016-01-20 ENCOUNTER — Other Ambulatory Visit: Payer: Self-pay | Admitting: *Deleted

## 2016-01-20 DIAGNOSIS — Z9081 Acquired absence of spleen: Secondary | ICD-10-CM

## 2016-01-20 NOTE — Progress Notes (Signed)
Dr. [name] requested Dr. Benay Spice arrange for Haemophilis influenza B vaccine to be given in this office. Pt is S/P splenectomy, will have other vaccines done at his pharmacy. Order sent to schedulers to contact pt with injection appt.

## 2016-01-23 ENCOUNTER — Telehealth: Payer: Self-pay | Admitting: Oncology

## 2016-01-23 NOTE — Telephone Encounter (Signed)
cld & spoke to pt and gave pt time & date of inj 7/12@2 

## 2016-01-25 ENCOUNTER — Ambulatory Visit (HOSPITAL_BASED_OUTPATIENT_CLINIC_OR_DEPARTMENT_OTHER): Payer: BC Managed Care – PPO

## 2016-01-25 VITALS — BP 134/76 | HR 83 | Temp 98.5°F | Resp 20

## 2016-01-25 DIAGNOSIS — Z23 Encounter for immunization: Secondary | ICD-10-CM | POA: Diagnosis not present

## 2016-01-25 DIAGNOSIS — Z9081 Acquired absence of spleen: Secondary | ICD-10-CM

## 2016-01-25 MED ORDER — HAEMOPHILUS B POLYSAC CONJ VAC IM SOLR
0.5000 mL | Freq: Once | INTRAMUSCULAR | Status: AC
  Administered 2016-01-25: 0.5 mL via INTRAMUSCULAR
  Filled 2016-01-25: qty 0.5

## 2016-01-25 NOTE — Patient Instructions (Signed)
Diphtheria Toxoid; Tetanus Toxoid Adsorbed, DT, Td What is this medicine? DIPHTHERIA AND TETANUS TOXOIDS ADSORBED (dif THEER ee uh and TET n us TOK soids ad SAWRB) is a vaccine. It is used to prevent infections of diphtheria and tetanus (lockjaw). This medicine may be used for other purposes; ask your health care provider or pharmacist if you have questions. What should I tell my health care provider before I take this medicine? They need to know if you have any of these conditions: -bleeding disorder -immune system problems -infection with fever -low levels of platelets in the blood -an unusual or allergic reaction to diphtheria or tetanus toxoid, latex, thimerosal, other medicines, foods, dyes, or preservatives -pregnant or trying to get pregnant -breast-feeding How should I use this medicine? This vaccine is for injection into a muscle. It is given by a health care professional. A copy of Vaccine Information Statements will be given before each vaccination. Read this sheet carefully each time. The sheet may change frequently. Talk to your pediatrician regarding the use of this medicine in children. While this drug may be prescribed for selected conditions, precautions do apply. Overdosage: If you think you have taken too much of this medicine contact a poison control center or emergency room at once. NOTE: This medicine is only for you. Do not share this medicine with others. What if I miss a dose? Keep appointments for follow-up (booster) doses as directed. It is important not to miss your dose. Call your doctor or health care professional if you are unable to keep an appointment. What may interact with this medicine? -adalimumab -anakinra -infliximab -live vaccines -medicines that suppress your immune system -medicines to treat cancer -medicines that treat or prevent blood clots like daily aspirin, enoxaparin, heparin, ticlopidine, warfarin -radiopharmaceuticals like iodine I-125 or  I-131 This list may not describe all possible interactions. Give your health care provider a list of all the medicines, herbs, non-prescription drugs, or dietary supplements you use. Also tell them if you smoke, drink alcohol, or use illegal drugs. Some items may interact with your medicine. What should I watch for while using this medicine? Contact your doctor or health care professional and seek emergency medical care if any serious side effects occur. This vaccine, like all vaccines, may not fully protect everyone. What side effects may I notice from receiving this medicine? Side effects that you should report to your doctor or health care professional as soon as possible: -allergic reactions like skin rash, itching or hives, swelling of the face, lips, or tongue -arthritis pain -breathing problems -changes in hearing -extreme changes in behavior -fast, irregular heartbeat -fever over 100 degrees F -pain, tingling, numbness in the hands or feet -seizures -unusually weak or tired Side effects that usually do not require medical attention (report to your doctor or health care professional if they continue or are bothersome): -aches or pains -bruising, pain, swelling at site where injected -headache -loss of appetite -low-grade fever of 100 degrees F or less -nausea, vomiting -sleepy -swollen glands This list may not describe all possible side effects. Call your doctor for medical advice about side effects. You may report side effects to FDA at 1-800-FDA-1088. Where should I keep my medicine? This drug is given in a hospital or clinic and will not be stored at home. NOTE: This sheet is a summary. It may not cover all possible information. If you have questions about this medicine, talk to your doctor, pharmacist, or health care provider.    2016, Elsevier/Gold Standard. (  2007-10-30 13:57:36)  

## 2016-02-02 ENCOUNTER — Other Ambulatory Visit (HOSPITAL_BASED_OUTPATIENT_CLINIC_OR_DEPARTMENT_OTHER): Payer: BC Managed Care – PPO

## 2016-02-02 ENCOUNTER — Ambulatory Visit (HOSPITAL_BASED_OUTPATIENT_CLINIC_OR_DEPARTMENT_OTHER): Payer: BC Managed Care – PPO | Admitting: Nurse Practitioner

## 2016-02-02 ENCOUNTER — Telehealth: Payer: Self-pay | Admitting: Oncology

## 2016-02-02 ENCOUNTER — Telehealth: Payer: Self-pay

## 2016-02-02 ENCOUNTER — Encounter: Payer: Self-pay | Admitting: Nurse Practitioner

## 2016-02-02 VITALS — BP 132/85 | HR 83 | Temp 97.7°F | Resp 20 | Ht 75.0 in | Wt 212.4 lb

## 2016-02-02 DIAGNOSIS — Z9081 Acquired absence of spleen: Secondary | ICD-10-CM

## 2016-02-02 DIAGNOSIS — D509 Iron deficiency anemia, unspecified: Secondary | ICD-10-CM

## 2016-02-02 DIAGNOSIS — C182 Malignant neoplasm of ascending colon: Secondary | ICD-10-CM

## 2016-02-02 DIAGNOSIS — I82401 Acute embolism and thrombosis of unspecified deep veins of right lower extremity: Secondary | ICD-10-CM

## 2016-02-02 DIAGNOSIS — Z7901 Long term (current) use of anticoagulants: Secondary | ICD-10-CM

## 2016-02-02 LAB — CBC WITH DIFFERENTIAL/PLATELET
BASO%: 1.3 % (ref 0.0–2.0)
BASOS ABS: 0.1 10*3/uL (ref 0.0–0.1)
EOS ABS: 0.3 10*3/uL (ref 0.0–0.5)
EOS%: 5.6 % (ref 0.0–7.0)
HEMATOCRIT: 36.9 % — AB (ref 38.4–49.9)
HGB: 11.3 g/dL — ABNORMAL LOW (ref 13.0–17.1)
LYMPH#: 1.8 10*3/uL (ref 0.9–3.3)
LYMPH%: 30.6 % (ref 14.0–49.0)
MCH: 24 pg — AB (ref 27.2–33.4)
MCHC: 30.7 g/dL — AB (ref 32.0–36.0)
MCV: 78.2 fL — AB (ref 79.3–98.0)
MONO#: 0.6 10*3/uL (ref 0.1–0.9)
MONO%: 10.6 % (ref 0.0–14.0)
NEUT#: 3.1 10*3/uL (ref 1.5–6.5)
NEUT%: 51.9 % (ref 39.0–75.0)
PLATELETS: 449 10*3/uL — AB (ref 140–400)
RBC: 4.72 10*6/uL (ref 4.20–5.82)
RDW: 25.6 % — ABNORMAL HIGH (ref 11.0–14.6)
WBC: 6 10*3/uL (ref 4.0–10.3)

## 2016-02-02 MED ORDER — RIVAROXABAN 20 MG PO TABS: 20.0000 mg | ORAL_TABLET | Freq: Every day | ORAL | Status: DC

## 2016-02-02 NOTE — Telephone Encounter (Signed)
Called and left message with patient to inform him we would be giving him his follow up immunizations here at [facility] per Dr. Dois Davenport request and scheduling will call him to confirm dates and times.

## 2016-02-02 NOTE — Telephone Encounter (Signed)
Gave pt cal & avs °

## 2016-02-02 NOTE — Progress Notes (Signed)
  [facility] OFFICE PROGRESS NOTE   Diagnosis:  Colon cancer, right lower extremity DVT, iron deficiency anemia  INTERVAL HISTORY:   Mr. Knight returns as scheduled. He continues Xarelto and oral iron. The right calf pain has resolved. No bleeding. He is tolerating the oral iron. Bowels overall moving regularly. No nausea or vomiting.  Objective:  Vital signs in last 24 hours:  Blood pressure 132/85, pulse 83, temperature 97.7 F (36.5 C), temperature source Oral, resp. rate 20, height '6\' 3"'$  (1.905 m), weight 212 lb 6.4 oz (96.344 kg), SpO2 99 %.    HEENT: No thrush or ulcers. Resp: Lungs clear bilaterally. Cardio: Regular rate and rhythm. GI: Abdomen soft. Mild tenderness left upper quadrant. No hepatomegaly. Well-healed surgical incisions. Vascular: No leg edema. Calves soft and nontender.     Lab Results:  Lab Results  Component Value Date   WBC 6.0 02/02/2016   HGB 11.3* 02/02/2016   HCT 36.9* 02/02/2016   MCV 78.2* 02/02/2016   PLT 449* 02/02/2016   NEUTROABS 3.1 02/02/2016    Imaging:  No results found.  Medications: I have reviewed the patient's current medications.  Assessment/Plan: 1. Ascending colon cancer, stage II (T3 N0), G2, status post a right colectomy 12/27/2015  MSI-stable, no loss of mismatch repair protein expression  0/32 lymph nodes involved with metastatic carcinoma  2. 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 Iron deficiency anemia secondary to #1 4. Right lower extremity deep vein thrombosis confirmed on a Doppler ultrasound 01/10/2016-started Xarelto 5. Smokeless tobacco use   Disposition: Mr. Rodenberg appears stable. He will continue Xarelto to complete a six-month course. A new prescription was sent to his pharmacy.  The iron deficiency has partially corrected. He will continue oral iron. He will return for a follow-up CBC in 2-3 months.  We will discuss the vaccine  schedule with Dr. [name] and determine which office will administer.  He will return for a follow-up visit in December of this year with a CEA.  Plan reviewed with Dr. Benay Spice. 25 minutes were spent face-to-face at today's visit with the majority of that time involved in counseling/coordination of care.  Ned Card ANP/GNP-BC   02/02/2016  9:42 AM

## 2016-02-02 NOTE — Progress Notes (Addendum)
Spoke with Dr. [name] about patients immunizations, he has requested he do the follow up Prevnar 23 and meningococcals for pt d/t them not carrying it in the office. POF sent to add injection appts on 9/1 and 9/29.  Patients immunizations:  Hib - Haemophilus influenza b conjugate (received 01/25/16) PCV13 - Pneumococcal [Prevnar]  (received 01/20/16) Men ACWY - Meningococcal conjugate [Menveo]  (received 01/20/16)  2 Months after 1st injection:  PPSV23 - Pneumococcal (to receive on 03/16/16) MenACWY - Meningococcal conjugate [Menveo]  (to receive on 03/16/16) MenB-4C - Meningococcal B [Bexsero]  (to receive on 03/16/16)  1 month after first meningococcal B:  MenB-4C - Meningococcal B [Bexsero]  (to receive on 04/13/16)  5 years after surgery/splenectomy:  PPSV23 - Pneumococcal  MenACWY - Meningococcal conjugate [Menactra or Menveo]

## 2016-02-02 NOTE — Progress Notes (Signed)
Called Dr. Dois Davenport office to inquire if pt is to be getting his follow up vaccines with his office or at [facility]. Left message with triage nurse to have his nurse call back to confirm.

## 2016-02-04 ENCOUNTER — Telehealth: Payer: Self-pay | Admitting: Oncology

## 2016-02-04 NOTE — Telephone Encounter (Signed)
Per 7/20 pof added injection appointments for 9/1 and 9/29. Not able to reach patient re appointments schedule mailed. Other appointments remain the same.

## 2016-02-07 NOTE — Progress Notes (Signed)
Tetanus Vaccine given on 01/25/2016 in Pt's Right Arm. It is noted that the drug was unable to be scanned, over ridden, and unable to list this vaccine under the Immunization Tab

## 2016-02-15 ENCOUNTER — Encounter: Payer: Self-pay | Admitting: Genetic Counselor

## 2016-02-15 ENCOUNTER — Other Ambulatory Visit: Payer: BC Managed Care – PPO

## 2016-02-15 ENCOUNTER — Ambulatory Visit (HOSPITAL_BASED_OUTPATIENT_CLINIC_OR_DEPARTMENT_OTHER): Payer: BC Managed Care – PPO | Admitting: Genetic Counselor

## 2016-02-15 DIAGNOSIS — Z315 Encounter for genetic counseling: Secondary | ICD-10-CM

## 2016-02-15 DIAGNOSIS — Z801 Family history of malignant neoplasm of trachea, bronchus and lung: Secondary | ICD-10-CM | POA: Diagnosis not present

## 2016-02-15 DIAGNOSIS — C182 Malignant neoplasm of ascending colon: Secondary | ICD-10-CM

## 2016-02-15 DIAGNOSIS — Z8051 Family history of malignant neoplasm of kidney: Secondary | ICD-10-CM | POA: Diagnosis not present

## 2016-02-15 DIAGNOSIS — Z808 Family history of malignant neoplasm of other organs or systems: Secondary | ICD-10-CM

## 2016-02-15 NOTE — Progress Notes (Signed)
REFERRING PROVIDER: Everardo Beals, NP [facility] [place], [place] [address]   Donneta Romberg, MD  PRIMARY PROVIDER:  Imelda Pillow, NP  PRIMARY REASON FOR VISIT:  1. Cancer of ascending colon ([place])      HISTORY OF PRESENT ILLNESS:   Troy Lambert, a 45 y.o. male, was seen for a [place] cancer genetics consultation at the request of Dr. Benna Dunks due to a personal history of cancer.  Troy Lambert presents to clinic today to discuss the possibility of a hereditary predisposition to cancer, genetic testing, and to further clarify his future cancer risks, as well as potential cancer risks for family members.   In 2017, at the age of 57, Troy Lambert was diagnosed with cancer of the ascending colon. This was treated with surgery. 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  MSI was performed on the colon tumor and was found to be stable.  IHC was found to be intact.  CANCER HISTORY:   No history exists.      Past Medical History:  Diagnosis Date  . Allergy   . Anemia    iron def anemia  . Cancer ([place])    sqamous cell, basal cell skin cancers. Colon Cancer dx. surgery planned  . Diverticulosis    never a problem  . Right leg DVT ([place]) 01/10/2016    Past Surgical History:  Procedure Laterality Date  . CHOLECYSTECTOMY N/A 12/27/2015   Procedure: LAPAROSCOPIC CHOLECYSTECTOMY WITH INTRAOPERATIVE CHOLANGIOGRAM;  Surgeon: Greer Pickerel, MD;  Location: [facility];  Service: General;  Laterality: N/A;  . LAPAROSCOPIC PARTIAL COLECTOMY N/A 12/27/2015   Procedure: LAPAROSCOPIC PARTIAL RIGHT COLECTOMY;  Surgeon: Greer Pickerel, MD;  Location: [facility];  Service: General;  Laterality: N/A;  . 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   Procedure:  LAPAROSCOPIC SPLENECTOMY;  Surgeon: Greer Pickerel, MD;  Location: [facility];  Service: General;  Laterality: N/A;  . TONSILLECTOMY    . VASECTOMY    . WISDOM TOOTH  EXTRACTION      Social History   Social History  . Marital status: Married    Spouse name: [name]  . Number of children: 2  . Years of education: N/A   Social History Main Topics  . Smoking status: Never Smoker  . Smokeless tobacco: Current User    Types: Snuff     Comment: pt used smokless tobacco  . Alcohol use 0.0 oz/week     Comment: a couple of beers a night; 4-5 on weekend  . Drug use: No  . Sexual activity: Not Asked   Other Topics Concern  . None   Social History Narrative  . None     FAMILY HISTORY:  We obtained a detailed, 4-generation family history.  Significant diagnoses are listed below: Family History  Problem Relation Age of Onset  . Lung cancer Father 35    mets to bone  . Prostate cancer Paternal Uncle   . Lung cancer Maternal Grandmother     smoker and worked in [employer]  . Lung cancer Maternal Grandfather   . Kidney cancer Maternal Grandfather   . Heart attack Paternal Grandfather   . Brain cancer Paternal Uncle     dx in his 39s  . Colon cancer Neg Hx   . Esophageal cancer Neg Hx   . Pancreatic cancer Neg Hx   . Rectal cancer Neg Hx   . Stomach cancer  Neg Hx     The patient has two children who are cancer free.  He has one sister who is cancer free as well.  His mother is alive at 16.  She has never had colon polyps but did have a TAH.  The patinet's mother was one of six children. Her siblings are cancer free.  The patient's maternal grandparents both had lung cancer, his grandmother was a smoker and worked in a Equities trader. His grandfather had lung and kidney cancer and was a smoker.  The patient's father was diagnosed with lung cancer in his late 85s.  He was one of 15 children to his mother, and the patient was not sure how many sibs were half or full.  He had a full brother who has prostate cancer and another brother who had a brain cancer and died in his 37s.  His grandparents both died of non cancer related illnesses.  Patient's maternal  ancestors are of Caucasian descent, and paternal ancestors are of Caucasian descent. There is no reported Ashkenazi Jewish ancestry. There is no known consanguinity.  GENETIC COUNSELING ASSESSMENT: Troy Perin. is a 45 y.o. male with a personal history of colon cancer which is somewhat suggestive of a hereditary cancer syndrome and predisposition to cancer. We, therefore, discussed and recommended the following at today's visit.   DISCUSSION: We discussed that about 5-6% of colon cancer is hereditary with most cases due to Lynch syndrome.  We discussed that his tumor testing suggested that his colon cancer is NOT due to Lynch syndrome as MSI was stable and IHC was present.  We briefly reviewed other causes of inherited colon cancer.  If his testing is negative, we discussed that his children would need to be screened 10 years prior to his diagnosis and that his sister should have a colonoscopy now.  We reviewed the characteristics, features and inheritance patterns of hereditary cancer syndromes. We also discussed genetic testing, including the appropriate family members to test, the process of testing, insurance coverage and turn-around-time for results. We discussed the implications of a negative, positive and/or variant of uncertain significant result. We recommended Troy Lambert pursue genetic testing for the colorectal cancer gene panel. The Colorectal Cancer Panel offered by [facility] includes sequencing and/or duplication/deletion testing of the following 19 genes: APC, ATM, AXIN2, BMPR1A, CDH1, CHEK2, EPCAM, MLH1, MSH2, MSH6, MUTYH, PMS2, POLD1, POLE, PTEN, SCG5/GREM1, SMAD4, STK11, and TP53.   Based on Mr. Bangura family history of cancer, he meets medical criteria for genetic testing. Despite that he meets criteria, he may still have an out of pocket cost. We discussed that if his out of pocket cost for testing is over $100, the laboratory will call and confirm whether he wants to proceed with  testing.  If the out of pocket cost of testing is less than $100 he will be billed by the genetic testing laboratory.   PLAN: After considering the risks, benefits, and limitations, Troy Lambert  provided informed consent to pursue genetic testing and the blood sample was sent to [facility] for analysis of the Colorectal cancer panel. Results should be available within approximately 2-3 weeks' time, at which point they will be disclosed by telephone to Troy Lambert, as will any additional recommendations warranted by these results. Troy Lambert will receive a summary of his genetic counseling visit and a copy of his results once available. This information will also be available in Epic. We encouraged Troy Lambert to remain in contact with cancer  genetics annually so that we can continuously update the family history and inform him of any changes in cancer genetics and testing that may be of benefit for his family. Troy Lambert questions were answered to his satisfaction today. Our contact information was provided should additional questions or concerns arise.  Lastly, we encouraged Troy Lambert to remain in contact with cancer genetics annually so that we can continuously update the family history and inform him of any changes in cancer genetics and testing that may be of benefit for this family.   Mr.  Lambert questions were answered to his satisfaction today. Our contact information was provided should additional questions or concerns arise. Thank you for the referral and allowing [place] to share in the care of your patient.   Karen P. Florene Glen, [place], [facility] Certified Genetic Counselor [email or web address] phone: [phone]  The patient was seen for a total of 70 minutes in face-to-face genetic counseling.  This patient was discussed with Drs. Magrinat, Lindi Adie and/or Burr Medico who agrees with the above.    _______________________________________________________________________ For Office Staff:  Number  of people involved in session: 2 Was an Intern/ student involved with case: no

## 2016-03-02 ENCOUNTER — Ambulatory Visit: Payer: Self-pay | Admitting: Genetic Counselor

## 2016-03-02 ENCOUNTER — Telehealth: Payer: Self-pay | Admitting: Genetic Counselor

## 2016-03-02 ENCOUNTER — Encounter: Payer: Self-pay | Admitting: Genetic Counselor

## 2016-03-02 DIAGNOSIS — C182 Malignant neoplasm of ascending colon: Secondary | ICD-10-CM

## 2016-03-02 DIAGNOSIS — Z1379 Encounter for other screening for genetic and chromosomal anomalies: Secondary | ICD-10-CM | POA: Insufficient documentation

## 2016-03-02 NOTE — Telephone Encounter (Signed)
LM on VM with good news.  Asked that he CB for results.

## 2016-03-02 NOTE — Progress Notes (Signed)
HPI: Troy Lambert was previously seen in the [place] clinic due to a personal history of cancer and concerns regarding a hereditary predisposition to cancer. Please refer to our prior cancer genetics clinic note for more information regarding Troy Lambert medical, social and family histories, and our assessment and recommendations, at the time. Troy Lambert recent genetic test results were disclosed to him, as were recommendations warranted by these results. These results and recommendations are discussed in more detail below.  FAMILY HISTORY:  We obtained a detailed, 4-generation family history.  Significant diagnoses are listed below: Family History  Problem Relation Age of Onset  . Lung cancer Father 27    mets to bone  . Prostate cancer Paternal Uncle   . Lung cancer Maternal Grandmother     smoker and worked in [employer]  . Lung cancer Maternal Grandfather   . Kidney cancer Maternal Grandfather   . Heart attack Paternal Grandfather   . Brain cancer Paternal Uncle     dx in his 13s  . Colon cancer Neg Hx   . Esophageal cancer Neg Hx   . Pancreatic cancer Neg Hx   . Rectal cancer Neg Hx   . Stomach cancer Neg Hx     The patient has two children who are cancer free.  He has one sister who is cancer free as well.  His mother is alive at 74.  She has never had colon polyps but did have a TAH.  The patinet's mother was one of six children. Her siblings are cancer free.  The patient's maternal grandparents both had lung cancer, his grandmother was a smoker and worked in a Equities trader. His grandfather had lung and kidney cancer and was a smoker.  The patient's father was diagnosed with lung cancer in his late 68s.  He was one of 15 children to his mother, and the patient was not sure how many sibs were half or full.  He had a full brother who has prostate cancer and another brother who had a brain cancer and died in his 60s.  His grandparents both died of non cancer related  illnesses.  Patient's maternal ancestors are of Caucasian descent, and paternal ancestors are of Caucasian descent. There is no reported Ashkenazi Jewish ancestry. There is no known consanguinity.  GENETIC TEST RESULTS: At the time of Troy Lambert visit, we recommended he pursue genetic testing of the Colorectal cancer gene panel and MSH2 inversion analysis. The Colorectal Cancer Panel offered by [facility] includes sequencing and/or duplication/deletion testing of the following 19 genes: APC, ATM, AXIN2, BMPR1A, CDH1, CHEK2, EPCAM, MLH1, MSH2, MSH6, MUTYH, PMS2, POLD1, POLE, PTEN, SCG5/GREM1, SMAD4, STK11, and TP53. The report date is March 01, 2016.  Genetic testing was normal, and did not reveal a deleterious mutation in these genes. The test report has been scanned into EPIC and is located under the Molecular Pathology section of the Results Review tab.   We discussed with Troy Lambert that since the current genetic testing is not perfect, it is possible there may be a gene mutation in one of these genes that current testing cannot detect, but that chance is small. We also discussed, that it is possible that another gene that has not yet been discovered, or that we have not yet tested, is responsible for the cancer diagnoses in the family, and it is, therefore, important to remain in touch with cancer genetics in the future so that we can continue to offer Troy Lambert  the most up to date genetic testing.   CANCER SCREENING RECOMMENDATIONS: This result is reassuring and indicates that Troy Lambert likely does not have an increased risk for a future cancer due to a mutation in one of these genes. This normal test also suggests that Troy Lambert cancer was most likely not due to an inherited predisposition associated with one of these genes.  Most cancers happen by chance and this negative test suggests that his cancer falls into this category.  We, therefore, recommended he continue to follow the cancer management  and screening guidelines provided by his oncology and primary healthcare provider.   RECOMMENDATIONS FOR FAMILY MEMBERS: Women in this family might be at some increased risk of developing cancer, over the general population risk, simply due to the family history of cancer. We recommended women in this family have a yearly mammogram beginning at age 29, or 65 years younger than the earliest onset of cancer, an an annual clinical breast exam, and perform monthly breast self-exams. Women in this family should also have a gynecological exam as recommended by their primary provider. All family members should have a colonoscopy by age 12.  FOLLOW-UP: Lastly, we discussed with Troy Lambert that cancer genetics is a rapidly advancing field and it is possible that new genetic tests will be appropriate for him and/or his family members in the future. We encouraged him to remain in contact with cancer genetics on an annual basis so we can update his personal and family histories and let him know of advances in cancer genetics that may benefit this family.   Our contact number was provided. Troy Lambert questions were answered to his satisfaction, and he knows he is welcome to call us at anytime with additional questions or concerns.   Roma Kayser, MS, [employer] Certified Genetic Counselor [email or web address]

## 2016-03-02 NOTE — Telephone Encounter (Signed)
Revealed negative genetic testing on the colorectal cancer panel and MSH2 inversion testing.  Discussed that this does not explain why he had colon cancer.  We recommended that he keep in contact with [place] in case there are updated tests available in the future.  We recommend that family members start colonoscopies 10 years younger than his diagnosis.

## 2016-03-16 ENCOUNTER — Other Ambulatory Visit: Payer: Self-pay | Admitting: Oncology

## 2016-03-16 ENCOUNTER — Ambulatory Visit (HOSPITAL_BASED_OUTPATIENT_CLINIC_OR_DEPARTMENT_OTHER): Payer: BC Managed Care – PPO

## 2016-03-16 VITALS — BP 148/84 | HR 76 | Temp 98.0°F | Resp 20

## 2016-03-16 DIAGNOSIS — Z23 Encounter for immunization: Secondary | ICD-10-CM

## 2016-03-16 DIAGNOSIS — R161 Splenomegaly, not elsewhere classified: Secondary | ICD-10-CM

## 2016-03-16 MED ORDER — MENINGOCOCCAL A C Y&W-135 OLIG IM SOLR
0.5000 mL | Freq: Once | INTRAMUSCULAR | Status: AC
  Administered 2016-03-16: 0.5 mL via INTRAMUSCULAR
  Filled 2016-03-16: qty 0.5

## 2016-03-16 MED ORDER — PNEUMOCOCCAL VAC POLYVALENT 25 MCG/0.5ML IJ INJ
0.5000 mL | INJECTION | Freq: Once | INTRAMUSCULAR | Status: AC
  Administered 2016-03-16: 0.5 mL via INTRAMUSCULAR
  Filled 2016-03-16: qty 0.5

## 2016-03-16 MED ORDER — MENINGOCOCCAL VAC B (OMV) IM SUSY
0.5000 mL | PREFILLED_SYRINGE | Freq: Once | INTRAMUSCULAR | Status: AC
  Administered 2016-03-16: 0.5 mL via INTRAMUSCULAR
  Filled 2016-03-16: qty 0.5

## 2016-03-16 NOTE — Patient Instructions (Addendum)
Meningococcal Diphtheria Toxoid Conjugate Vaccine What is this medicine? MENINGOCOCCAL DIPHTHERIA TOXOID CONJUGATE VACCINE (muh ning goh KOK kal dif THEER ee uh TOK soid KON juh geyt vak SEEN) is a vaccine to protect from bacterial meningitis. This vaccine does not contain live bacteria. It will not cause a meningitis. This medicine may be used for other purposes; ask your health care provider or pharmacist if you have questions. What should I tell my health care provider before I take this medicine? They need to know if you have any of these conditions: -bleeding disorder -fever or infection -history of Guillain-Barre syndrome -immune system problems -an unusual or allergic reaction to diphtheria toxoid, meningococcal vaccine, latex, other medicines, foods, dyes, or preservatives -pregnant or trying to get pregnant -breast-feeding How should I use this medicine? This medicine is for injection into a muscle. It is given by a health care professional in a hospital or clinic setting. A copy of Vaccine Information Statements will be given before each vaccination. Read this sheet carefully each time. The sheet may change frequently. Talk to your pediatrician regarding the use of this medicine in children. While some brands of this drug may be prescribed for children as young as 73 months of age for selected conditions, precautions do apply. Overdosage: If you think you have taken too much of this medicine contact a poison control center or emergency room at once. NOTE: This medicine is only for you. Do not share this medicine with others. What if I miss a dose? This does not apply. What may interact with this medicine? -adalimumab -anakinra -infliximab -medicines for organ transplant -medicines to treat cancer -medicines used during some procedures to diagnose a medical condition -other vaccines -some medicines for arthritis -steroid medicines like prednisone or cortisone This list may not  describe all possible interactions. Give your health care provider a list of all the medicines, herbs, non-prescription drugs, or dietary supplements you use. Also tell them if you smoke, drink alcohol, or use illegal drugs. Some items may interact with your medicine. What should I watch for while using this medicine? Report any side effects that are worrisome to your doctor right away. Call your doctor if you have any unusual symptoms within 6 weeks of getting this vaccine. This vaccine may not protect from all meningitis infections. Women should inform their doctor if they wish to become pregnant or think they might be pregnant. Talk to your health care professional or pharmacist for more information. What side effects may I notice from receiving this medicine? Side effects that you should report to your doctor or health care professional as soon as possible: -allergic reactions like skin rash, itching or hives, swelling of the face, lips, or tongue -breathing problems -feeling faint or lightheaded, falls -fever over 102 degrees F -muscle weakness -unusual drooping or paralysis of face Side effects that usually do not require medical attention (report to your doctor or health care professional if they continue or are bothersome): -chills -diarrhea -headache -loss of appetite -muscle aches and pains -pain at site where injected -tired This list may not describe all possible side effects. Call your doctor for medical advice about side effects. You may report side effects to FDA at 1-800-FDA-1088. Where should I keep my medicine? This drug is given in a hospital or clinic and will not be stored at home. NOTE: This sheet is a summary. It may not cover all possible information. If you have questions about this medicine, talk to your doctor, pharmacist, or health  care provider.    2016, Elsevier/Gold Standard. (2009-11-22 21:41:10) Pneumococcal Vaccine, Polyvalent solution for injection What  is this medicine? PNEUMOCOCCAL VACCINE, POLYVALENT (NEU mo KOK al vak SEEN, pol ee VEY luhnt) is a vaccine to prevent pneumococcus bacteria infection. These bacteria are a major cause of ear infections, Strep throat infections, and serious pneumonia, meningitis, or blood infections worldwide. These vaccines help the body to produce antibodies (protective substances) that help your body defend against these bacteria. This vaccine is recommended for people 32 years of age and older with health problems. It is also recommended for all adults over 17 years old. This vaccine will not treat an infection. This medicine may be used for other purposes; ask your health care provider or pharmacist if you have questions. What should I tell my health care provider before I take this medicine? They need to know if you have any of these conditions: -bleeding problems -bone marrow or organ transplant -cancer, Hodgkin's disease -fever -infection -immune system problems -low platelet count in the blood -seizures -an unusual or allergic reaction to pneumococcal vaccine, diphtheria toxoid, other vaccines, latex, other medicines, foods, dyes, or preservatives -pregnant or trying to get pregnant -breast-feeding How should I use this medicine? This vaccine is for injection into a muscle or under the skin. It is given by a health care professional. A copy of Vaccine Information Statements will be given before each vaccination. Read this sheet carefully each time. The sheet may change frequently. Talk to your pediatrician regarding the use of this medicine in children. While this drug may be prescribed for children as young as 82 years of age for selected conditions, precautions do apply. Overdosage: If you think you have taken too much of this medicine contact a poison control center or emergency room at once. NOTE: This medicine is only for you. Do not share this medicine with others. What if I miss a dose? It is  important not to miss your dose. Call your doctor or health care professional if you are unable to keep an appointment. What may interact with this medicine? -medicines for cancer chemotherapy -medicines that suppress your immune function -medicines that treat or prevent blood clots like warfarin, enoxaparin, and dalteparin -steroid medicines like prednisone or cortisone This list may not describe all possible interactions. Give your health care provider a list of all the medicines, herbs, non-prescription drugs, or dietary supplements you use. Also tell them if you smoke, drink alcohol, or use illegal drugs. Some items may interact with your medicine. What should I watch for while using this medicine? Mild fever and pain should go away in 3 days or less. Report any unusual symptoms to your doctor or health care professional. What side effects may I notice from receiving this medicine? Side effects that you should report to your doctor or health care professional as soon as possible: -allergic reactions like skin rash, itching or hives, swelling of the face, lips, or tongue -breathing problems -confused -fever over 102 degrees F -pain, tingling, numbness in the hands or feet -seizures -unusual bleeding or bruising -unusual muscle weakness Side effects that usually do not require medical attention (report to your doctor or health care professional if they continue or are bothersome): -aches and pains -diarrhea -fever of 102 degrees F or less -headache -irritable -loss of appetite -pain, tender at site where injected -trouble sleeping This list may not describe all possible side effects. Call your doctor for medical advice about side effects. You may report side effects  to FDA at 1-800-FDA-1088. Where should I keep my medicine? This does not apply. This vaccine is given in a clinic, pharmacy, doctor's office, or other health care setting and will not be stored at home. NOTE: This sheet  is a summary. It may not cover all possible information. If you have questions about this medicine, talk to your doctor, pharmacist, or health care provider.    2016, Elsevier/Gold Standard. (2008-02-06 14:32:37)

## 2016-04-13 ENCOUNTER — Ambulatory Visit (HOSPITAL_BASED_OUTPATIENT_CLINIC_OR_DEPARTMENT_OTHER): Payer: BC Managed Care – PPO

## 2016-04-13 DIAGNOSIS — Z23 Encounter for immunization: Secondary | ICD-10-CM

## 2016-04-13 DIAGNOSIS — Z9081 Acquired absence of spleen: Secondary | ICD-10-CM

## 2016-04-13 NOTE — Progress Notes (Signed)
Patient received Meningococcal Group B Vaccine (BEXSERO) today, via left deltoid.  Patient tolerated injection well.  Package insert given to patient for review.  Patient informed to check temperature for the next few days and to report to ED if any other adverse side effects.  Patient verbalized understanding.  Discharged home with no complaints.

## 2016-04-25 ENCOUNTER — Ambulatory Visit (INDEPENDENT_AMBULATORY_CARE_PROVIDER_SITE_OTHER): Payer: BC Managed Care – PPO | Admitting: Podiatry

## 2016-04-25 DIAGNOSIS — M722 Plantar fascial fibromatosis: Secondary | ICD-10-CM | POA: Diagnosis not present

## 2016-04-25 NOTE — Progress Notes (Signed)
Subjective:     Patient ID: Troy Lambert., male   DOB: 01/04/1971, 45 y.o.   MRN: [id number]  HPI patient states I'm improved but still having discomfort and I know my flatfeet have to be addressed and I did have colon cancer that I'm recovering from   Review of Systems     Objective:   Physical Exam Neurovascular status intact with discomfort still plantar fascial that's improved but is present with moderate depression of the arch noted upon weightbearing    Assessment:     Continued fasciitis-like symptomatology right that is gradually improving    Plan:     Discussed condition and at this point I've recommended long-term orthotics and scanned for a Berkley orthotic with deep heel seat in order to take pressure off the arch. Patient will be seen back and also will require second pair of orthotics for running once I know we have them in the proper position

## 2016-04-30 ENCOUNTER — Encounter: Payer: Self-pay | Admitting: Oncology

## 2016-05-03 ENCOUNTER — Other Ambulatory Visit: Payer: Self-pay | Admitting: *Deleted

## 2016-05-04 ENCOUNTER — Other Ambulatory Visit: Payer: Self-pay | Admitting: *Deleted

## 2016-05-14 ENCOUNTER — Encounter: Payer: Self-pay | Admitting: *Deleted

## 2016-05-19 ENCOUNTER — Telehealth: Payer: Self-pay | Admitting: General Surgery

## 2016-05-19 NOTE — Telephone Encounter (Signed)
Pt's wife called stating the patient was having nausea, 1 episode of vomiting, back and neck pain with no fever.  Have been seen in urgent care and per her report, was felt to be a virus.  Recommended that they notify his PCP and to come to ED if symptoms did not improve.  Unsure if this is related to surgery or not.  Pt is almost 5 months out from his operation.

## 2016-05-21 ENCOUNTER — Ambulatory Visit (INDEPENDENT_AMBULATORY_CARE_PROVIDER_SITE_OTHER): Payer: BC Managed Care – PPO

## 2016-05-21 DIAGNOSIS — M722 Plantar fascial fibromatosis: Secondary | ICD-10-CM

## 2016-05-21 NOTE — Patient Instructions (Signed)

## 2016-05-21 NOTE — Progress Notes (Signed)
Patient presents for orthotic pick up.  Verbal and written break in and wear instructions given.  Patient will follow up in 4 weeks if symptoms worsen or fail to improve. 

## 2016-06-17 ENCOUNTER — Telehealth: Payer: Self-pay | Admitting: Oncology

## 2016-06-17 NOTE — Telephone Encounter (Signed)
Lvm advising appt moved from 12/19 to 07/24/16 @ 3pm due to md pal.

## 2016-06-20 ENCOUNTER — Encounter: Payer: Self-pay | Admitting: Oncology

## 2016-07-03 ENCOUNTER — Ambulatory Visit: Payer: BC Managed Care – PPO | Admitting: Oncology

## 2016-07-03 ENCOUNTER — Other Ambulatory Visit: Payer: BC Managed Care – PPO

## 2016-07-04 ENCOUNTER — Encounter: Payer: Self-pay | Admitting: Neurology

## 2016-07-04 ENCOUNTER — Ambulatory Visit (INDEPENDENT_AMBULATORY_CARE_PROVIDER_SITE_OTHER): Payer: Self-pay | Admitting: Neurology

## 2016-07-04 VITALS — BP 139/65 | HR 86 | Ht 75.0 in | Wt 238.0 lb

## 2016-07-04 DIAGNOSIS — M5417 Radiculopathy, lumbosacral region: Secondary | ICD-10-CM

## 2016-07-04 NOTE — Progress Notes (Signed)
Patient left.

## 2016-07-04 NOTE — Progress Notes (Signed)
Sent referral to [facility] per AA,MD request for left S1 nerve root impingement. Sent to our office in error. Pt aware we are sending referral. Fax: [phone]. Received confirmation.

## 2016-07-12 ENCOUNTER — Other Ambulatory Visit (HOSPITAL_BASED_OUTPATIENT_CLINIC_OR_DEPARTMENT_OTHER): Payer: BC Managed Care – PPO | Admitting: *Deleted

## 2016-07-12 ENCOUNTER — Encounter: Payer: Self-pay | Admitting: Oncology

## 2016-07-12 ENCOUNTER — Ambulatory Visit (HOSPITAL_BASED_OUTPATIENT_CLINIC_OR_DEPARTMENT_OTHER): Payer: BC Managed Care – PPO | Admitting: Oncology

## 2016-07-12 ENCOUNTER — Other Ambulatory Visit (HOSPITAL_BASED_OUTPATIENT_CLINIC_OR_DEPARTMENT_OTHER): Payer: BC Managed Care – PPO

## 2016-07-12 VITALS — BP 137/102 | HR 99 | Temp 97.9°F | Resp 18 | Ht 75.0 in | Wt 236.9 lb

## 2016-07-12 DIAGNOSIS — D638 Anemia in other chronic diseases classified elsewhere: Secondary | ICD-10-CM

## 2016-07-12 DIAGNOSIS — C182 Malignant neoplasm of ascending colon: Secondary | ICD-10-CM | POA: Diagnosis not present

## 2016-07-12 DIAGNOSIS — I82401 Acute embolism and thrombosis of unspecified deep veins of right lower extremity: Secondary | ICD-10-CM | POA: Diagnosis not present

## 2016-07-12 DIAGNOSIS — D509 Iron deficiency anemia, unspecified: Secondary | ICD-10-CM

## 2016-07-12 LAB — CBC WITH DIFFERENTIAL/PLATELET
BASO%: 0.9 % (ref 0.0–2.0)
BASOS ABS: 0.1 10*3/uL (ref 0.0–0.1)
EOS%: 3.4 % (ref 0.0–7.0)
Eosinophils Absolute: 0.3 10*3/uL (ref 0.0–0.5)
HCT: 45.9 % (ref 38.4–49.9)
HGB: 15.5 g/dL (ref 13.0–17.1)
LYMPH%: 38.7 % (ref 14.0–49.0)
MCH: 31.2 pg (ref 27.2–33.4)
MCHC: 33.8 g/dL (ref 32.0–36.0)
MCV: 92.4 fL (ref 79.3–98.0)
MONO#: 0.9 10*3/uL (ref 0.1–0.9)
MONO%: 12 % (ref 0.0–14.0)
NEUT#: 3.5 10*3/uL (ref 1.5–6.5)
NEUT%: 45 % (ref 39.0–75.0)
Platelets: 398 10*3/uL (ref 140–400)
RBC: 4.97 10*6/uL (ref 4.20–5.82)
RDW: 15.2 % — ABNORMAL HIGH (ref 11.0–14.6)
WBC: 7.8 10*3/uL (ref 4.0–10.3)
lymph#: 3 10*3/uL (ref 0.9–3.3)

## 2016-07-12 LAB — CEA (IN HOUSE-CHCC): CEA (CHCC-In House): 3.08 ng/mL (ref 0.00–5.00)

## 2016-07-12 NOTE — Progress Notes (Signed)
  [facility] OFFICE PROGRESS NOTE   Diagnosis:  Colon cancer, right lower extremity DVT, iron deficiency anemia  INTERVAL HISTORY:   [name] returns as scheduled. Having difficulty with a  Bulging disc and due to see Neurosurgery soon. The right calf pain has resolved. No bleeding. He is on Xarelto and oral iron. Bowels overall moving regularly. No nausea or vomiting.  Objective:  Vital signs in last 24 hours:  Blood pressure (!) 137/102, pulse 99, temperature 97.9 F (36.6 C), temperature source Oral, resp. rate 18, height 6' 3" (1.905 m), weight 236 lb 14.4 oz (107.5 kg), SpO2 99 %.    HEENT: No thrush or ulcers. Resp: Lungs clear bilaterally. Cardio: Regular rate and rhythm. GI: Abdomen soft. No tenderness. No hepatomegaly. Well-healed surgical incisions. Vascular: No leg edema. Calves soft and nontender.     Lab Results:  Lab Results  Component Value Date   WBC 7.8 07/12/2016   HGB 15.5 07/12/2016   HCT 45.9 07/12/2016   MCV 92.4 07/12/2016   PLT 398 07/12/2016   NEUTROABS 3.5 07/12/2016    Imaging:  No results found.  Medications: I have reviewed the patient's current medications.  Assessment/Plan: 1. Ascending colon cancer, stage II (T3 N0), G2, status post a right colectomy 12/27/2015  MSI-stable, no loss of mismatch repair protein expression  0/32 lymph nodes involved with metastatic carcinoma  2. 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 Iron deficiency anemia secondary to #1 4. Right lower extremity deep vein thrombosis confirmed on a Doppler ultrasound 01/10/2016-Completed Xarelto 5. Smokeless tobacco use   Disposition: [name] appears stable. CBC from today was stable and CEA is pending. We will contact him with results.   He is also being followed by Dr. Redmond Lambert every 6 months. Colonoscopy due in May 2018.  He will follow-up with [place] in 6 months.  20 minutes were spent face-to-face  at today's visit with the majority of that time involved in counseling/coordination of care.  Troy Bussing, DNP, AGPCNP-BC, AOCNP   07/12/2016  12:54 PM

## 2016-07-13 ENCOUNTER — Telehealth: Payer: Self-pay | Admitting: *Deleted

## 2016-07-13 NOTE — Telephone Encounter (Signed)
Message left on patient's private cell phone to inform him per K. Curcio NP that his HGB and CEA remain normal. OK to leave a message per patient per K. Curcio NP. Patient instructed to call [facility] back with any questions or concerns.

## 2016-07-17 ENCOUNTER — Telehealth: Payer: Self-pay | Admitting: Oncology

## 2016-07-17 NOTE — Telephone Encounter (Signed)
spoke with patient to confirm next appointment of 01/08/16 at 2:30 PM

## 2016-07-24 ENCOUNTER — Ambulatory Visit: Payer: BC Managed Care – PPO | Admitting: Oncology

## 2016-07-24 ENCOUNTER — Other Ambulatory Visit: Payer: BC Managed Care – PPO

## 2016-09-26 ENCOUNTER — Encounter: Payer: Self-pay | Admitting: Gastroenterology

## 2016-10-11 ENCOUNTER — Encounter: Payer: Self-pay | Admitting: Gastroenterology

## 2016-11-16 ENCOUNTER — Ambulatory Visit (AMBULATORY_SURGERY_CENTER): Payer: Self-pay

## 2016-11-16 VITALS — Ht 74.0 in | Wt 227.0 lb

## 2016-11-16 DIAGNOSIS — Z85038 Personal history of other malignant neoplasm of large intestine: Secondary | ICD-10-CM

## 2016-11-16 MED ORDER — NA SULFATE-K SULFATE-MG SULF 17.5-3.13-1.6 GM/177ML PO SOLN: 1.0000 | Freq: Once | ORAL | 0 refills | Status: AC

## 2016-11-16 NOTE — Progress Notes (Signed)
Denies allergies to eggs or soy products. Denies complication of anesthesia or sedation. Denies use of weight loss medication. Denies use of O2.   Emmi instructions declined, just saw video.

## 2016-11-22 ENCOUNTER — Encounter: Payer: Self-pay | Admitting: Gastroenterology

## 2016-11-28 ENCOUNTER — Inpatient Hospital Stay (HOSPITAL_COMMUNITY)
Admission: EM | Admit: 2016-11-28 | Discharge: 2016-12-02 | DRG: 872 | Disposition: A | Discharge status: Home / Self Care | Payer: BC Managed Care – PPO | Attending: Internal Medicine | Admitting: Internal Medicine

## 2016-11-28 ENCOUNTER — Emergency Department (HOSPITAL_COMMUNITY): Payer: BC Managed Care – PPO

## 2016-11-28 ENCOUNTER — Encounter (HOSPITAL_COMMUNITY): Payer: Self-pay | Admitting: Emergency Medicine

## 2016-11-28 DIAGNOSIS — K5720 Diverticulitis of large intestine with perforation and abscess without bleeding: Secondary | ICD-10-CM | POA: Diagnosis present

## 2016-11-28 DIAGNOSIS — Z801 Family history of malignant neoplasm of trachea, bronchus and lung: Secondary | ICD-10-CM

## 2016-11-28 DIAGNOSIS — K769 Liver disease, unspecified: Secondary | ICD-10-CM | POA: Diagnosis present

## 2016-11-28 DIAGNOSIS — Z9081 Acquired absence of spleen: Secondary | ICD-10-CM

## 2016-11-28 DIAGNOSIS — Z9049 Acquired absence of other specified parts of digestive tract: Secondary | ICD-10-CM

## 2016-11-28 DIAGNOSIS — Z885 Allergy status to narcotic agent status: Secondary | ICD-10-CM

## 2016-11-28 DIAGNOSIS — A419 Sepsis, unspecified organism: Secondary | ICD-10-CM | POA: Diagnosis not present

## 2016-11-28 DIAGNOSIS — Z808 Family history of malignant neoplasm of other organs or systems: Secondary | ICD-10-CM

## 2016-11-28 DIAGNOSIS — Z85038 Personal history of other malignant neoplasm of large intestine: Secondary | ICD-10-CM

## 2016-11-28 DIAGNOSIS — R1030 Lower abdominal pain, unspecified: Secondary | ICD-10-CM | POA: Diagnosis not present

## 2016-11-28 DIAGNOSIS — C182 Malignant neoplasm of ascending colon: Secondary | ICD-10-CM | POA: Diagnosis present

## 2016-11-28 DIAGNOSIS — Z7951 Long term (current) use of inhaled steroids: Secondary | ICD-10-CM

## 2016-11-28 DIAGNOSIS — Z8042 Family history of malignant neoplasm of prostate: Secondary | ICD-10-CM

## 2016-11-28 DIAGNOSIS — Z8051 Family history of malignant neoplasm of kidney: Secondary | ICD-10-CM

## 2016-11-28 DIAGNOSIS — Z8249 Family history of ischemic heart disease and other diseases of the circulatory system: Secondary | ICD-10-CM

## 2016-11-28 LAB — CBC WITH DIFFERENTIAL/PLATELET
BASOS ABS: 0 10*3/uL (ref 0.0–0.1)
Basophils Relative: 0 %
Eosinophils Absolute: 0.1 10*3/uL (ref 0.0–0.7)
Eosinophils Relative: 0 %
HCT: 47.9 % (ref 39.0–52.0)
Hemoglobin: 16.2 g/dL (ref 13.0–17.0)
LYMPHS PCT: 7 %
Lymphs Abs: 1.3 10*3/uL (ref 0.7–4.0)
MCH: 30.5 pg (ref 26.0–34.0)
MCHC: 33.8 g/dL (ref 30.0–36.0)
MCV: 90 fL (ref 78.0–100.0)
Monocytes Absolute: 1.2 10*3/uL — ABNORMAL HIGH (ref 0.1–1.0)
Monocytes Relative: 7 %
NEUTROS ABS: 15.5 10*3/uL — AB (ref 1.7–7.7)
Neutrophils Relative %: 86 %
PLATELETS: 321 10*3/uL (ref 150–400)
RBC: 5.32 MIL/uL (ref 4.22–5.81)
RDW: 16.7 % — ABNORMAL HIGH (ref 11.5–15.5)
WBC: 18 10*3/uL — AB (ref 4.0–10.5)

## 2016-11-28 LAB — URINALYSIS, DIPSTICK ONLY
Bilirubin Urine: NEGATIVE
GLUCOSE, UA: NEGATIVE mg/dL
HGB URINE DIPSTICK: NEGATIVE
Ketones, ur: NEGATIVE mg/dL
Leukocytes, UA: NEGATIVE
Nitrite: NEGATIVE
PH: 5 (ref 5.0–8.0)
Protein, ur: NEGATIVE mg/dL
SPECIFIC GRAVITY, URINE: 1.006 (ref 1.005–1.030)

## 2016-11-28 LAB — I-STAT CG4 LACTIC ACID, ED
LACTIC ACID, VENOUS: 1.57 mmol/L (ref 0.5–1.9)
Lactic Acid, Venous: 1.08 mmol/L (ref 0.5–1.9)

## 2016-11-28 LAB — COMPREHENSIVE METABOLIC PANEL
ALT: 40 U/L (ref 17–63)
AST: 47 U/L — AB (ref 15–41)
Albumin: 4.5 g/dL (ref 3.5–5.0)
Alkaline Phosphatase: 59 U/L (ref 38–126)
Anion gap: 11 (ref 5–15)
BUN: 11 mg/dL (ref 6–20)
CHLORIDE: 102 mmol/L (ref 101–111)
CO2: 23 mmol/L (ref 22–32)
CREATININE: 1.21 mg/dL (ref 0.61–1.24)
Calcium: 9.7 mg/dL (ref 8.9–10.3)
GFR calc Af Amer: >60 mL/min (ref >60)
GLUCOSE: 96 mg/dL (ref 65–99)
Potassium: 4.1 mmol/L (ref 3.5–5.1)
Sodium: 136 mmol/L (ref 135–145)
Total Bilirubin: 0.9 mg/dL (ref 0.3–1.2)
Total Protein: 7.5 g/dL (ref 6.5–8.1)

## 2016-11-28 MED ORDER — IOPAMIDOL (ISOVUE-300) INJECTION 61%
100.0000 mL | Freq: Once | INTRAVENOUS | Status: AC | PRN
  Administered 2016-11-28: 100 mL via INTRAVENOUS

## 2016-11-28 MED ORDER — VANCOMYCIN HCL 10 G IV SOLR
2000.0000 mg | Freq: Once | INTRAVENOUS | Status: AC
  Administered 2016-11-28: 2000 mg via INTRAVENOUS
  Filled 2016-11-28: qty 2000

## 2016-11-28 MED ORDER — ONDANSETRON 4 MG PO TBDP
ORAL_TABLET | ORAL | Status: AC
  Administered 2016-11-28: 4 mg
  Filled 2016-11-28: qty 1

## 2016-11-28 MED ORDER — PIPERACILLIN-TAZOBACTAM 3.375 G IVPB 30 MIN
3.3750 g | Freq: Once | INTRAVENOUS | Status: AC
Start: 2016-11-28 — End: 2016-11-28
  Administered 2016-11-28: 3.375 g via INTRAVENOUS
  Filled 2016-11-28: qty 50

## 2016-11-28 MED ORDER — VANCOMYCIN HCL IN DEXTROSE 1-5 GM/200ML-% IV SOLN: 1000.0000 mg | Freq: Once | INTRAVENOUS | Status: DC

## 2016-11-28 MED ORDER — FENTANYL CITRATE (PF) 100 MCG/2ML IJ SOLN
50.0000 ug | Freq: Once | INTRAMUSCULAR | Status: AC
  Administered 2016-11-28: 50 ug via INTRAVENOUS
  Filled 2016-11-28: qty 2

## 2016-11-28 MED ORDER — FENTANYL CITRATE (PF) 100 MCG/2ML IJ SOLN
INTRAMUSCULAR | Status: AC
  Administered 2016-11-28: 50 ug
  Filled 2016-11-28: qty 2

## 2016-11-28 MED ORDER — SODIUM CHLORIDE 0.9 % IV BOLUS (SEPSIS)
1000.0000 mL | Freq: Once | INTRAVENOUS | Status: AC
  Administered 2016-11-28: 1000 mL via INTRAVENOUS

## 2016-11-28 MED ORDER — ACETAMINOPHEN 325 MG PO TABS
650.0000 mg | ORAL_TABLET | Freq: Once | ORAL | Status: AC
  Administered 2016-11-28: 650 mg via ORAL

## 2016-11-28 MED ORDER — ACETAMINOPHEN 325 MG PO TABS
ORAL_TABLET | ORAL | Status: AC
  Filled 2016-11-28: qty 2

## 2016-11-28 NOTE — ED Notes (Signed)
Patient transported to CT 

## 2016-11-28 NOTE — ED Provider Notes (Signed)
[facility] DEPT Provider Note   CSN: [id number] Arrival date & time: 11/28/16  1900     History   Chief Complaint Chief Complaint  Patient presents with  . Fever  . Abdominal Pain  . Dysuria    HPI Troy Lambert. is a 46 y.o. male.  Patient with PMH of colon cancer and prior partial colectomy presents to the ED with a chief complaint of abdominal pain.  He states that the pain started yesterday and has been gradually worsening.  He states that the pain is located in his lower abdomen and radiates to his perineum.  He reports associated fever to 102.7.  He denies any nausea, vomiting, or diarrhea.  He has tried taking Tylenol with no relief.      The history is provided by the patient. No language interpreter was used.    Past Medical History:  Diagnosis Date  . Allergy   . Anemia    iron def anemia  . Cancer ([place])    sqamous cell, basal cell skin cancers. Colon Cancer dx. surgery planned  . Diverticulosis    never a problem  . Right leg DVT ([place]) 01/10/2016    Patient Active Problem List   Diagnosis Date Noted  . Genetic testing 03/02/2016  . Cancer of ascending colon ([place]) 01/10/2016  . Right leg DVT ([place]) 01/10/2016  . Anemia of chronic disease 12/31/2015  . Symptomatic cholelithiasis 12/31/2015  . Splenic mass 12/31/2015    Past Surgical History:  Procedure Laterality Date  . CHOLECYSTECTOMY N/A 12/27/2015   Procedure: LAPAROSCOPIC CHOLECYSTECTOMY WITH INTRAOPERATIVE CHOLANGIOGRAM;  Surgeon: Greer Pickerel, MD;  Location: [facility] ORS;  Service: General;  Laterality: N/A;  . LAPAROSCOPIC PARTIAL COLECTOMY N/A 12/27/2015   Procedure: LAPAROSCOPIC PARTIAL RIGHT COLECTOMY;  Surgeon: Greer Pickerel, MD;  Location: [facility] ORS;  Service: General;  Laterality: N/A;  . 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   Procedure:  LAPAROSCOPIC SPLENECTOMY;  Surgeon: Greer Pickerel, MD;  Location: [facility] ORS;  Service: General;  Laterality: N/A;  . TONSILLECTOMY    . VASECTOMY    . WISDOM  TOOTH EXTRACTION         Home Medications    Prior to Admission medications   Medication Sig Start Date End Date Taking? Authorizing Provider  fluticasone (FLONASE) 50 MCG/ACT nasal spray Place into both nostrils daily.    [provider]  loratadine (CLARITIN) 10 MG tablet Take 10 mg by mouth daily as needed for allergies.     [provider]  Multiple Vitamin (MULTIVITAMIN) tablet Take 1 tablet by mouth daily.    [provider]  ondansetron (ZOFRAN) 4 MG tablet Take 1 tablet (4 mg total) by mouth every 8 (eight) hours as needed for nausea or vomiting. 12/31/15   Greer Pickerel, MD  OVER THE COUNTER MEDICATION Take 1 scoop by mouth. C4 Pre-work out vitamins. 1 scoop mixed in water before working out.    [provider]  testosterone cypionate (DEPOTESTOSTERONE CYPIONATE) 200 MG/ML injection  06/19/16   [provider]  zolpidem (AMBIEN) 10 MG tablet Take 5 mg by mouth at bedtime as needed for sleep.     [provider]    Family History Family History  Problem Relation Age of Onset  . Lung cancer Father 77       mets to bone  . Prostate cancer Paternal Uncle   . Lung cancer Maternal Grandmother        smoker and worked in [employer]  .  Lung cancer Maternal Grandfather   . Kidney cancer Maternal Grandfather   . Heart attack Paternal Grandfather   . Brain cancer Paternal Uncle        dx in his 53s  . Colon cancer Neg Hx   . Esophageal cancer Neg Hx   . Pancreatic cancer Neg Hx   . Rectal cancer Neg Hx   . Stomach cancer Neg Hx     Social History Social History  Substance Use Topics  . Smoking status: Never Smoker  . Smokeless tobacco: Current User    Types: Chew     Comment: pt used smokless tobacco  . Alcohol use 0.0 oz/week     Comment: [place]     Allergies   Dilaudid [hydromorphone hcl]   Review of Systems Review of Systems  All other systems reviewed and are negative.    Physical Exam Updated Vital  Signs BP (!) 141/70   Pulse 99   Temp (!) 102.5 F (39.2 C) (Oral)   Resp 20   SpO2 94%   Physical Exam  Constitutional: He is oriented to person, place, and time. He appears well-developed and well-nourished.  HENT:  Head: Normocephalic and atraumatic.  Eyes: Conjunctivae and EOM are normal. Pupils are equal, round, and reactive to light. Right eye exhibits no discharge. Left eye exhibits no discharge. No scleral icterus.  Neck: Normal range of motion. Neck supple. No JVD present.  Cardiovascular: Normal rate, regular rhythm and normal heart sounds.  Exam reveals no gallop and no friction rub.   No murmur heard. Normal rate on my exam  Pulmonary/Chest: Effort normal and breath sounds normal. No respiratory distress. He has no wheezes. He has no rales. He exhibits no tenderness.  Abdominal: Soft. Bowel sounds are normal. He exhibits no distension and no mass. There is tenderness. There is no rebound and no guarding.  Periumbilical and RLQ tender to palpation No upper abdominal tenderness  Musculoskeletal: Normal range of motion. He exhibits no edema or tenderness.  Neurological: He is alert and oriented to person, place, and time.  Skin: Skin is warm and dry.  Psychiatric: He has a normal mood and affect. His behavior is normal. Judgment and thought content normal.  Nursing note and vitals reviewed.    ED Treatments / Results  Labs (all labs ordered are listed, but only abnormal results are displayed) Labs Reviewed  CBC WITH DIFFERENTIAL/PLATELET - Abnormal; Notable for the following:       Result Value   WBC 18.0 (*)    RDW 16.7 (*)    Neutro Abs 15.5 (*)    Monocytes Absolute 1.2 (*)    All other components within normal limits  COMPREHENSIVE METABOLIC PANEL - Abnormal; Notable for the following:    AST 47 (*)    All other components within normal limits  URINALYSIS, DIPSTICK ONLY - Abnormal; Notable for the following:    Color, Urine STRAW (*)    All other components  within normal limits  CULTURE, BLOOD (ROUTINE X 2)  CULTURE, BLOOD (ROUTINE X 2)  I-STAT CG4 LACTIC ACID, ED  I-STAT CG4 LACTIC ACID, ED    EKG  EKG Interpretation None       Radiology Ct Abdomen Pelvis W Contrast  Result Date: 11/29/2016 CLINICAL DATA:  Initial evaluation for acute periumbilical pain. EXAM: CT ABDOMEN AND PELVIS WITH CONTRAST TECHNIQUE: Multidetector CT imaging of the abdomen and pelvis was performed using the standard protocol following bolus administration of intravenous contrast. CONTRAST:  139mL  ISOVUE-300 IOPAMIDOL (ISOVUE-300) INJECTION 61% COMPARISON:  None available. FINDINGS: Lower chest: Minimal atelectatic changes present within the visualized lung bases. Visualized lungs are otherwise clear. Hepatobiliary: Focal 1.8 cm hypodensity within the liver adjacent to the falciform ligament suspected to reflect focal fat deposition, not well seen on delayed sequence. Adjacent parenchymal calcification is similar to previous. Liver otherwise unremarkable. Gallbladder surgically absent. No biliary dilatation. Pancreas: Pancreas within normal limits. 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 Adrenals/Urinary Tract: Adrenal glands are normal. Kidneys equal in size with symmetric enhancement. No nephrolithiasis, hydronephrosis or focal enhancing renal mass. No hydroureter. Partially distended bladder within normal limits. Stomach/Bowel: Stomach within normal limits. No evidence for bowel obstruction. Patient is status post partial colectomy. Appendix not visualized. Prominent inflammatory stranding about multiple sigmoid diverticula, consistent with acute diverticulitis. Small focus of extraluminal gas within this region consistent with associated micro perforation (series 3, image 70). No discrete abscess or other complication. Vascular/Lymphatic: Mild atheromatous plaque within the intra-abdominal aorta. Normal intravascular enhancement seen throughout the intra-abdominal aorta and its branch  vessels. No pathologically enlarged intra-abdominal or pelvic lymph nodes. Reproductive: Prostate normal. Other: Small volume free fluid noted within the pelvis, likely reactive. Musculoskeletal: No acute osseus abnormality. No worrisome lytic or blastic osseous lesions. IMPRESSION: 1. Findings consistent with acute sigmoid diverticulitis. Few small foci of extraluminal gas within this region consistent with associated perforation. No abscess. 2. No other acute intra-abdominopelvic process. 3. Vague 1.8 cm hypodensity within the liver adjacent to the falciform ligament, most likely focal fat deposition. Attention at follow-up recommended. 4. Status post partial colectomy, splenectomy, and cholecystectomy. Critical Value/emergent results were called by telephone at the time of interpretation on 11/29/2016 at 12:10 am to Dr. Montine Circle , who verbally acknowledged these results. Electronically Signed   By: Jeannine Boga M.D.   On: 11/28/2016 23:59    Procedures Procedures (including critical care time)  Medications Ordered in ED Medications  sodium chloride 0.9 % bolus 1,000 mL (not administered)  piperacillin-tazobactam (ZOSYN) IVPB 3.375 g (not administered)  vancomycin (VANCOCIN) 2,000 mg in sodium chloride 0.9 % 500 mL IVPB (not administered)  fentaNYL (SUBLIMAZE) 100 MCG/2ML injection (50 mcg  Given 11/28/16 2126)  acetaminophen (TYLENOL) tablet 650 mg (650 mg Oral Given 11/28/16 2126)  ondansetron (ZOFRAN-ODT) 4 MG disintegrating tablet (4 mg  Given 11/28/16 2127)  fentaNYL (SUBLIMAZE) injection 50 mcg (50 mcg Intravenous Given 11/28/16 2304)     Initial Impression / Assessment and Plan / ED Course  I have reviewed the triage vital signs and the nursing notes.  Pertinent labs & imaging results that were available during my care of the patient were reviewed by me and considered in my medical decision making (see chart for details).     Patient with worsening abdominal pain since  yesterday.  Pain is periumbilical and RLQ.  Associated fever, but no n/v/d.  Hx of partial colectomy, cholecystectomy, and splenectomy.  No known active cancer now.    WBC is 18, fever to 102.7, tachy to 105.  Meets SIRs.  Will give a liter bolus and start empiric antibiotics.  Weight based fluids not indicated given that BP is stable and lactate is <4.  Will check CT and reassess.  Consider appy, diverticulitis,  prostatitis, also high risk for SBO.   CT shows findings consistent with sigmoid diverticulitis with micro perforations and a small amount of extraluminal free air.  No abscess.  Discussed with Dr. Johnney Killian, who agrees with plan for admission to medicine.  Appreciate Dr. Alcario Drought  for bringing patient into the hospital.  Final Clinical Impressions(s) / ED Diagnoses   Final diagnoses:  Diverticulitis of large intestine with perforation without bleeding    New Prescriptions New Prescriptions   No medications on file     Montine Circle, Hershal Coria 11/29/16 [id number]    Charlesetta Shanks, MD 12/01/16 [phone]

## 2016-11-28 NOTE — ED Triage Notes (Signed)
Patient reports fever today with low abdominal pain and dysuria , no emesis or diarrhea . Pt. took 1 gram of Tylenol prior to arrival.

## 2016-11-29 ENCOUNTER — Telehealth: Payer: Self-pay

## 2016-11-29 ENCOUNTER — Encounter (HOSPITAL_COMMUNITY): Payer: Self-pay | Admitting: *Deleted

## 2016-11-29 DIAGNOSIS — Z7951 Long term (current) use of inhaled steroids: Secondary | ICD-10-CM | POA: Diagnosis not present

## 2016-11-29 DIAGNOSIS — Z9081 Acquired absence of spleen: Secondary | ICD-10-CM | POA: Diagnosis not present

## 2016-11-29 DIAGNOSIS — Z8042 Family history of malignant neoplasm of prostate: Secondary | ICD-10-CM | POA: Diagnosis not present

## 2016-11-29 DIAGNOSIS — Z801 Family history of malignant neoplasm of trachea, bronchus and lung: Secondary | ICD-10-CM | POA: Diagnosis not present

## 2016-11-29 DIAGNOSIS — Z8249 Family history of ischemic heart disease and other diseases of the circulatory system: Secondary | ICD-10-CM | POA: Diagnosis not present

## 2016-11-29 DIAGNOSIS — Z8051 Family history of malignant neoplasm of kidney: Secondary | ICD-10-CM | POA: Diagnosis not present

## 2016-11-29 DIAGNOSIS — K769 Liver disease, unspecified: Secondary | ICD-10-CM | POA: Diagnosis not present

## 2016-11-29 DIAGNOSIS — C182 Malignant neoplasm of ascending colon: Secondary | ICD-10-CM | POA: Diagnosis not present

## 2016-11-29 DIAGNOSIS — Z808 Family history of malignant neoplasm of other organs or systems: Secondary | ICD-10-CM | POA: Diagnosis not present

## 2016-11-29 DIAGNOSIS — Z885 Allergy status to narcotic agent status: Secondary | ICD-10-CM | POA: Diagnosis not present

## 2016-11-29 DIAGNOSIS — R1030 Lower abdominal pain, unspecified: Secondary | ICD-10-CM | POA: Diagnosis present

## 2016-11-29 DIAGNOSIS — Z85038 Personal history of other malignant neoplasm of large intestine: Secondary | ICD-10-CM | POA: Diagnosis not present

## 2016-11-29 DIAGNOSIS — Z9049 Acquired absence of other specified parts of digestive tract: Secondary | ICD-10-CM | POA: Diagnosis not present

## 2016-11-29 DIAGNOSIS — A419 Sepsis, unspecified organism: Secondary | ICD-10-CM | POA: Diagnosis present

## 2016-11-29 DIAGNOSIS — K5720 Diverticulitis of large intestine with perforation and abscess without bleeding: Secondary | ICD-10-CM | POA: Diagnosis present

## 2016-11-29 LAB — CBC
HEMATOCRIT: 45.3 % (ref 39.0–52.0)
HEMOGLOBIN: 15.1 g/dL (ref 13.0–17.0)
MCH: 30.1 pg (ref 26.0–34.0)
MCHC: 33.3 g/dL (ref 30.0–36.0)
MCV: 90.2 fL (ref 78.0–100.0)
Platelets: 317 10*3/uL (ref 150–400)
RBC: 5.02 MIL/uL (ref 4.22–5.81)
RDW: 16.6 % — ABNORMAL HIGH (ref 11.5–15.5)
WBC: 19.6 10*3/uL — ABNORMAL HIGH (ref 4.0–10.5)

## 2016-11-29 LAB — HIV ANTIBODY (ROUTINE TESTING W REFLEX): HIV SCREEN 4TH GENERATION: NONREACTIVE

## 2016-11-29 LAB — BASIC METABOLIC PANEL
Anion gap: 8 (ref 5–15)
BUN: 9 mg/dL (ref 6–20)
CHLORIDE: 105 mmol/L (ref 101–111)
CO2: 23 mmol/L (ref 22–32)
Calcium: 8.6 mg/dL — ABNORMAL LOW (ref 8.9–10.3)
Creatinine, Ser: 1.18 mg/dL (ref 0.61–1.24)
GFR calc Af Amer: >60 mL/min (ref >60)
GFR calc non Af Amer: >60 mL/min (ref >60)
GLUCOSE: 114 mg/dL — AB (ref 65–99)
POTASSIUM: 4.5 mmol/L (ref 3.5–5.1)
Sodium: 136 mmol/L (ref 135–145)

## 2016-11-29 MED ORDER — SODIUM CHLORIDE 0.9 % IV SOLN
INTRAVENOUS | Status: DC
  Administered 2016-11-29: 08:00:00 via INTRAVENOUS

## 2016-11-29 MED ORDER — ONDANSETRON HCL 4 MG PO TABS: 4.0000 mg | ORAL_TABLET | Freq: Four times a day (QID) | ORAL | Status: DC | PRN

## 2016-11-29 MED ORDER — ZOLPIDEM TARTRATE 5 MG PO TABS
5.0000 mg | ORAL_TABLET | Freq: Every evening | ORAL | Status: DC | PRN
  Administered 2016-11-30 – 2016-12-01 (×2): 5 mg via ORAL
  Filled 2016-11-29 (×2): qty 1

## 2016-11-29 MED ORDER — ADULT MULTIVITAMIN W/MINERALS CH
1.0000 | ORAL_TABLET | Freq: Every day | ORAL | Status: DC
  Administered 2016-11-29 – 2016-12-01 (×3): 1 via ORAL
  Filled 2016-11-29 (×3): qty 1

## 2016-11-29 MED ORDER — ACETAMINOPHEN 325 MG PO TABS
650.0000 mg | ORAL_TABLET | Freq: Four times a day (QID) | ORAL | Status: DC | PRN
  Administered 2016-11-29: 650 mg via ORAL
  Filled 2016-11-29: qty 2

## 2016-11-29 MED ORDER — LORATADINE 10 MG PO TABS: 10.0000 mg | ORAL_TABLET | Freq: Every day | ORAL | Status: DC | PRN

## 2016-11-29 MED ORDER — SODIUM CHLORIDE 0.9 % IV SOLN
INTRAVENOUS | Status: DC
  Administered 2016-11-29: 02:00:00 via INTRAVENOUS

## 2016-11-29 MED ORDER — ACETAMINOPHEN 650 MG RE SUPP: 650.0000 mg | Freq: Four times a day (QID) | RECTAL | Status: DC | PRN

## 2016-11-29 MED ORDER — ONDANSETRON HCL 4 MG/2ML IJ SOLN
4.0000 mg | Freq: Four times a day (QID) | INTRAMUSCULAR | Status: DC | PRN
  Administered 2016-11-30: 4 mg via INTRAVENOUS
  Filled 2016-11-29: qty 2

## 2016-11-29 MED ORDER — FLUTICASONE PROPIONATE 50 MCG/ACT NA SUSP: 1.0000 | Freq: Every day | NASAL | Status: DC | PRN

## 2016-11-29 MED ORDER — ENOXAPARIN SODIUM 40 MG/0.4ML ~~LOC~~ SOLN
40.0000 mg | Freq: Every day | SUBCUTANEOUS | Status: DC
Start: 2016-11-29 — End: 2016-12-02
  Administered 2016-11-29 – 2016-12-01 (×3): 40 mg via SUBCUTANEOUS
  Filled 2016-11-29 (×3): qty 0.4

## 2016-11-29 MED ORDER — PIPERACILLIN-TAZOBACTAM 3.375 G IVPB
3.3750 g | Freq: Three times a day (TID) | INTRAVENOUS | Status: DC
  Administered 2016-11-29 – 2016-12-02 (×10): 3.375 g via INTRAVENOUS
  Filled 2016-11-29 (×11): qty 50

## 2016-11-29 MED ORDER — DIPHENHYDRAMINE HCL 25 MG PO CAPS: 25.0000 mg | ORAL_CAPSULE | Freq: Four times a day (QID) | ORAL | Status: DC | PRN

## 2016-11-29 MED ORDER — SODIUM CHLORIDE 0.9 % IV BOLUS (SEPSIS)
500.0000 mL | Freq: Once | INTRAVENOUS | Status: AC
  Administered 2016-11-29: 500 mL via INTRAVENOUS

## 2016-11-29 MED ORDER — SODIUM CHLORIDE 0.9 % IV SOLN
INTRAVENOUS | Status: DC
  Administered 2016-11-29 (×2): via INTRAVENOUS

## 2016-11-29 MED ORDER — HYDROCODONE-ACETAMINOPHEN 5-325 MG PO TABS
1.0000 | ORAL_TABLET | ORAL | Status: DC | PRN
  Administered 2016-11-29: 2 via ORAL
  Administered 2016-11-29: 1 via ORAL
  Administered 2016-11-29 – 2016-11-30 (×2): 2 via ORAL
  Filled 2016-11-29: qty 2
  Filled 2016-11-29: qty 1
  Filled 2016-11-29 (×2): qty 2

## 2016-11-29 NOTE — ED Notes (Signed)
Pt made aware of bed assignment 

## 2016-11-29 NOTE — Consult Note (Signed)
[facility] Surgery Consult Note  Troy Lambert. [birth year]  [id number].    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 Greer Pickerel. Spleen pathology was benign and patient has not required chemotherapy (followed by Dr. Benay Spice). He presented to [facility] emergency room on 5/17 with 24 hours of lower abdominal pain and fever. Associated symptoms include nausea and loose stools, denies melena or hematochezia. Tried to take Tylenol without relief of symptoms. He reports that he has a history of diverticulitis that was noted on colonoscopy and treated with PO abx. Patient is a nonsmoker, drinks 3-4 beers on weekends, and denies illicit drug use. He works in [employer]. He has a history of DVT RLE last year but is no longer on anticoagulation.   ED workup: fever 102.5, WBC 18, lactate WNL, UA WNL CT abdomen pelvis significant for inflammatory stranding of the sigmoid colon and small amount of extraluminal gas consistent with microperforation. No abscess noted. A 1.8 cm hypodensity of the liver was also noted.  The patient was admitted to the hospital and the medical service and started on IV Zosyn. He is currently NPO. CEA pending.  ROS: Review of Systems  Constitutional: Positive for diaphoresis and fever.  Gastrointestinal: Positive for abdominal pain, diarrhea and nausea. Negative for blood in stool and melena.  All other systems reviewed and are negative.   Family History  Problem Relation Age of Onset  . Lung cancer Father 30       mets to bone  . Prostate cancer Paternal Uncle   . Lung cancer Maternal Grandmother        smoker and worked in [employer]  . Lung cancer Maternal Grandfather   .  Kidney cancer Maternal Grandfather   . Heart attack Paternal Grandfather   . Brain cancer Paternal Uncle        dx in his 1s  . Colon cancer Neg Hx   . Esophageal cancer Neg Hx   . Pancreatic cancer Neg Hx   . Rectal cancer Neg Hx   . Stomach cancer Neg Hx     Past Medical History:  Diagnosis Date  . Allergy   . Anemia    iron def anemia  . Cancer ([place])    sqamous cell, basal cell skin cancers. Colon Cancer dx. surgery planned  . Diverticulosis    never a problem  . Right leg DVT ([place]) 01/10/2016    Past Surgical History:  Procedure Laterality Date  . CHOLECYSTECTOMY N/A 12/27/2015   Procedure: LAPAROSCOPIC CHOLECYSTECTOMY WITH INTRAOPERATIVE CHOLANGIOGRAM;  Surgeon: Greer Pickerel, MD;  Location: [facility];  Service: General;  Laterality: N/A;  . LAPAROSCOPIC PARTIAL COLECTOMY N/A 12/27/2015   Procedure: LAPAROSCOPIC PARTIAL RIGHT COLECTOMY;  Surgeon: Greer Pickerel, MD;  Location: [facility];  Service: General;  Laterality: N/A;  . 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   Procedure:  LAPAROSCOPIC SPLENECTOMY;  Surgeon: Greer Pickerel, MD;  Location: [facility];  Service: General;  Laterality: N/A;  . TONSILLECTOMY    . VASECTOMY    . WISDOM TOOTH EXTRACTION      Social History:  reports that he has never smoked. His smokeless tobacco use includes Chew. He reports that he drinks alcohol. He reports that he does not use drugs.  Allergies:  Allergies  Allergen Reactions  . Dilaudid [Hydromorphone Hcl] Itching    Medications Prior to Admission  Medication Sig Dispense Refill  . acetaminophen (TYLENOL) 325 MG tablet Take 650 mg by mouth every 6 (six) hours as needed for mild pain or fever.    . fluticasone (FLONASE) 50 MCG/ACT nasal spray Place 1 spray into both nostrils daily as needed for allergies.     [name] Kitchen loratadine (CLARITIN) 10 MG tablet Take 10 mg by mouth daily as needed for allergies.     . Multiple Vitamin (MULTIVITAMIN) tablet Take 1 tablet by mouth daily.    [name] Kitchen testosterone  cypionate (DEPOTESTOSTERONE CYPIONATE) 200 MG/ML injection Inject 100 mg into the muscle every 14 (fourteen) days.     [name] Kitchen zolpidem (AMBIEN) 10 MG tablet Take 5 mg by mouth at bedtime as needed for sleep.     [name] Kitchen ondansetron (ZOFRAN) 4 MG tablet Take 1 tablet (4 mg total) by mouth every 8 (eight) hours as needed for nausea or vomiting. (Patient not taking: Reported on 11/29/2016) 20 tablet 0    Blood pressure (!) 125/59, pulse 93, temperature 98.3 F (36.8 C), temperature source Oral, resp. rate 20, weight 100.6 kg (221 lb 12.8 oz), SpO2 91 %. Physical Exam: Physical Exam  Constitutional: He is oriented to person, place, and time. He appears well-developed and well-nourished. No distress.  HENT:  Head: Normocephalic and atraumatic.  Right Ear: External ear normal.  Left Ear: External ear normal.  Eyes: EOM are normal. Pupils are equal, round, and reactive to light. No scleral icterus.  Neck: Normal range of motion. Neck supple. No tracheal deviation present.  Cardiovascular: Normal rate, regular rhythm and normal heart sounds.  Exam reveals no gallop and no friction rub.   No murmur heard. Pulmonary/Chest: Effort normal and breath sounds normal. No stridor. No respiratory distress. He has no wheezes. He has no rales. He exhibits no tenderness.  Abdominal: Soft. He exhibits distension. He exhibits no mass. There is tenderness. There is guarding. No hernia.  Hypoactive bowel sounds, lower abdominal and suprapubic tenderness with involuntary guarding.   Musculoskeletal: Normal range of motion. He exhibits no edema or deformity.  Neurological: He is alert and oriented to person, place, and time. No sensory deficit.  Skin: Skin is warm and dry. Capillary refill takes less than 2 seconds. He is not diaphoretic. No erythema.  Psychiatric: He has a normal mood and affect. His behavior is normal.    Results for orders placed or performed during the hospital encounter of 11/28/16 (from the past 48  hour(s))  CBC with Differential     Status: Abnormal   Collection Time: 11/28/16  7:53 PM  Result Value Ref Range   WBC 18.0 (H) 4.0 - 10.5 K/uL   RBC 5.32 4.22 - 5.81 MIL/uL   Hemoglobin 16.2 13.0 - 17.0 g/dL   HCT 47.9 39.0 - 52.0 %   MCV 90.0 78.0 - 100.0 fL   MCH 30.5 26.0 - 34.0 pg   MCHC 33.8 30.0 - 36.0 g/dL   RDW 16.7 (H) 11.5 - 15.5 %   Platelets 321 150 - 400 K/uL   Neutrophils Relative % 86 %   Neutro Abs 15.5 (H) 1.7 - 7.7 K/uL   Lymphocytes Relative 7 %   Lymphs Abs 1.3 0.7 - 4.0 K/uL   Monocytes Relative 7 %   Monocytes Absolute 1.2 (H) 0.1 - 1.0 K/uL   Eosinophils Relative 0 %   Eosinophils Absolute 0.1 0.0 - 0.7 K/uL   Basophils  Relative 0 %   Basophils Absolute 0.0 0.0 - 0.1 K/uL  Comprehensive metabolic panel     Status: Abnormal   Collection Time: 11/28/16  7:53 PM  Result Value Ref Range   Sodium 136 135 - 145 mmol/L   Potassium 4.1 3.5 - 5.1 mmol/L   Chloride 102 101 - 111 mmol/L   CO2 23 22 - 32 mmol/L   Glucose, Bld 96 65 - 99 mg/dL   BUN 11 6 - 20 mg/dL   Creatinine, Ser 1.21 0.61 - 1.24 mg/dL   Calcium 9.7 8.9 - 10.3 mg/dL   Total Protein 7.5 6.5 - 8.1 g/dL   Albumin 4.5 3.5 - 5.0 g/dL   AST 47 (H) 15 - 41 U/L   ALT 40 17 - 63 U/L   Alkaline Phosphatase 59 38 - 126 U/L   Total Bilirubin 0.9 0.3 - 1.2 mg/dL   GFR calc non Af Amer >60 >60 mL/min   GFR calc Af Amer >60 >60 mL/min    Comment: (NOTE) The eGFR has been calculated using the CKD EPI equation. This calculation has not been validated in all clinical situations. eGFR's persistently <60 mL/min signify possible Chronic Kidney Disease.    Anion gap 11 5 - 15  Urinalysis, dipstick only     Status: Abnormal   Collection Time: 11/28/16  7:56 PM  Result Value Ref Range   Color, Urine STRAW (A) YELLOW   APPearance CLEAR CLEAR   Specific Gravity, Urine 1.006 1.005 - 1.030   pH 5.0 5.0 - 8.0   Glucose, UA NEGATIVE NEGATIVE mg/dL   Hgb urine dipstick NEGATIVE NEGATIVE   Bilirubin Urine  NEGATIVE NEGATIVE   Ketones, ur NEGATIVE NEGATIVE mg/dL   Protein, ur NEGATIVE NEGATIVE mg/dL   Nitrite NEGATIVE NEGATIVE   Leukocytes, UA NEGATIVE NEGATIVE  I-Stat CG4 Lactic Acid, ED     Status: None   Collection Time: 11/28/16  8:14 PM  Result Value Ref Range   Lactic Acid, Venous 1.57 0.5 - 1.9 mmol/L  I-Stat CG4 Lactic Acid, ED     Status: None   Collection Time: 11/28/16 11:09 PM  Result Value Ref Range   Lactic Acid, Venous 1.08 0.5 - 1.9 mmol/L  CBC     Status: Abnormal   Collection Time: 11/29/16  1:15 AM  Result Value Ref Range   WBC 19.6 (H) 4.0 - 10.5 K/uL   RBC 5.02 4.22 - 5.81 MIL/uL   Hemoglobin 15.1 13.0 - 17.0 g/dL   HCT 45.3 39.0 - 52.0 %   MCV 90.2 78.0 - 100.0 fL   MCH 30.1 26.0 - 34.0 pg   MCHC 33.3 30.0 - 36.0 g/dL   RDW 16.6 (H) 11.5 - 15.5 %   Platelets 317 150 - 400 K/uL  Basic metabolic panel     Status: Abnormal   Collection Time: 11/29/16  1:15 AM  Result Value Ref Range   Sodium 136 135 - 145 mmol/L   Potassium 4.5 3.5 - 5.1 mmol/L   Chloride 105 101 - 111 mmol/L   CO2 23 22 - 32 mmol/L   Glucose, Bld 114 (H) 65 - 99 mg/dL   BUN 9 6 - 20 mg/dL   Creatinine, Ser 1.18 0.61 - 1.24 mg/dL   Calcium 8.6 (L) 8.9 - 10.3 mg/dL   GFR calc non Af Amer >60 >60 mL/min   GFR calc Af Amer >60 >60 mL/min    Comment: (NOTE) The eGFR has been calculated using the CKD EPI  equation. This calculation has not been validated in all clinical situations. eGFR's persistently <60 mL/min signify possible Chronic Kidney Disease.    Anion gap 8 5 - 15   Ct Abdomen Pelvis W Contrast  Result Date: 11/29/2016 CLINICAL DATA:  Initial evaluation for acute periumbilical pain. EXAM: CT ABDOMEN AND PELVIS WITH CONTRAST TECHNIQUE: Multidetector CT imaging of the abdomen and pelvis was performed using the standard protocol following bolus administration of intravenous contrast. CONTRAST:  148m ISOVUE-300 IOPAMIDOL (ISOVUE-300) INJECTION 61% COMPARISON:  None available. FINDINGS:  Lower chest: Minimal atelectatic changes present within the visualized lung bases. Visualized lungs are otherwise clear. Hepatobiliary: Focal 1.8 cm hypodensity within the liver adjacent to the falciform ligament suspected to reflect focal fat deposition, not well seen on delayed sequence. Adjacent parenchymal calcification is similar to previous. Liver otherwise unremarkable. Gallbladder surgically absent. No biliary dilatation. Pancreas: Pancreas within normal limits. 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 Adrenals/Urinary Tract: Adrenal glands are normal. Kidneys equal in size with symmetric enhancement. No nephrolithiasis, hydronephrosis or focal enhancing renal mass. No hydroureter. Partially distended bladder within normal limits. Stomach/Bowel: Stomach within normal limits. No evidence for bowel obstruction. Patient is status post partial colectomy. Appendix not visualized. Prominent inflammatory stranding about multiple sigmoid diverticula, consistent with acute diverticulitis. Small focus of extraluminal gas within this region consistent with associated micro perforation (series 3, image 70). No discrete abscess or other complication. Vascular/Lymphatic: Mild atheromatous plaque within the intra-abdominal aorta. Normal intravascular enhancement seen throughout the intra-abdominal aorta and its branch vessels. No pathologically enlarged intra-abdominal or pelvic lymph nodes. Reproductive: Prostate normal. Other: Small volume free fluid noted within the pelvis, likely reactive. Musculoskeletal: No acute osseus abnormality. No worrisome lytic or blastic osseous lesions. IMPRESSION: 1. Findings consistent with acute sigmoid diverticulitis. Few small foci of extraluminal gas within this region consistent with associated perforation. No abscess. 2. No other acute intra-abdominopelvic process. 3. Vague 1.8 cm hypodensity within the liver adjacent to the falciform ligament, most likely focal fat deposition. Attention at  follow-up recommended. 4. Status post partial colectomy, splenectomy, and cholecystectomy. Critical Value/emergent results were called by telephone at the time of interpretation on 11/29/2016 at 12:10 am to Dr. RMontine Circle, who verbally acknowledged these results. Electronically Signed   By: BJeannine BogaM.D.   On: 11/28/2016 23:59      Assessment/Plan Acute sigmoid diverticulitis with microperforation  - second known episode of diverticulitis, first episode treated outpatient with PO abx - WBC 19 - continue non-operative management with IV antibiotics and bowel rest - if patient does not improve with conservative management he would likely require sigmoid colectomy and colostomy this admission. If he improves with non-op managment, he would like to follow up outpatient with Dr. EGreer Pickerel  PMH Colon CA - s/p Right colectomy 12/2015. no known recurrence, CEA pending  Liver lesion   EJill Alexanders PPris[facility] HospitalSurgery 11/29/2016, 7:51 AM Pager: [phone]: [phone]-Fri 7:00 am-4:30 pm Sat-Sun 7:00 am-11:30 am

## 2016-11-29 NOTE — H&P (Signed)
History and Physical    Troy Lambert. ELF:[id number] DOB: 1970-08-12 DOA: 11/28/2016  PCP: Everardo Beals, NP  Patient coming from: Home  I have personally briefly reviewed patient's old medical records in [place]  Chief Complaint: Abd pain  HPI: Troy Lambert. is a 46 y.o. 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  Patient presents to the ED with c/o abd pain.  Symptoms onset yesterday, lower abdomen.  Fever 102.7.  Tylenol provides no relief.   ED Course: Diverticulitis of sigmoid colon with microperf.   Review of Systems: As per HPI otherwise 10 point review of systems negative.   Past Medical History:  Diagnosis Date  . Allergy   . Anemia    iron def anemia  . Cancer ([place])    sqamous cell, basal cell skin cancers. Colon Cancer dx. surgery planned  . Diverticulosis    never a problem  . Right leg DVT ([place]) 01/10/2016    Past Surgical History:  Procedure Laterality Date  . CHOLECYSTECTOMY N/A 12/27/2015   Procedure: LAPAROSCOPIC CHOLECYSTECTOMY WITH INTRAOPERATIVE CHOLANGIOGRAM;  Surgeon: Greer Pickerel, MD;  [facility];  Service: General;  Laterality: N/A;  . LAPAROSCOPIC PARTIAL COLECTOMY N/A 12/27/2015   Procedure: LAPAROSCOPIC PARTIAL RIGHT COLECTOMY;  Surgeon: Greer Pickerel, MD;  Location: [facility];  Service: General;  Laterality: N/A;  . 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   Procedure:  LAPAROSCOPIC SPLENECTOMY;  Surgeon: Greer Pickerel, MD;  [facility];  Service: General;  Laterality: N/A;  . TONSILLECTOMY    . VASECTOMY    . WISDOM TOOTH EXTRACTION       reports that he has never smoked. His smokeless tobacco use includes Chew. He reports that he drinks alcohol. He reports that he does not use drugs.  Allergies  Allergen Reactions  . Dilaudid [Hydromorphone Hcl] Itching    Family History  Problem Relation Age of Onset  . Lung cancer Father 65   mets to bone  . Prostate cancer Paternal Uncle   . Lung cancer Maternal Grandmother        smoker and worked in [employer]  . Lung cancer Maternal Grandfather   . Kidney cancer Maternal Grandfather   . Heart attack Paternal Grandfather   . Brain cancer Paternal Uncle        dx in his 92s  . Colon cancer Neg Hx   . Esophageal cancer Neg Hx   . Pancreatic cancer Neg Hx   . Rectal cancer Neg Hx   . Stomach cancer Neg Hx      Prior to Admission medications   Medication Sig Start Date End Date Taking? Authorizing Provider  acetaminophen (TYLENOL) 325 MG tablet Take 650 mg by mouth every 6 (six) hours as needed for mild pain or fever.   Yes [provider]  fluticasone (FLONASE) 50 MCG/ACT nasal spray Place 1 spray into both nostrils daily as needed for allergies.    Yes [provider]  loratadine (CLARITIN) 10 MG tablet Take 10 mg by mouth daily as needed for allergies.    Yes [provider]  Multiple Vitamin (MULTIVITAMIN) tablet Take 1 tablet by mouth daily.   Yes [provider]  testosterone cypionate (DEPOTESTOSTERONE CYPIONATE) 200 MG/ML injection Inject 100 mg into the muscle every 14 (fourteen) days.  06/19/16  Yes [provider]  zolpidem (AMBIEN) 10 MG tablet Take 5 mg by mouth at bedtime as  needed for sleep.    Yes [provider]  ondansetron (ZOFRAN) 4 MG tablet Take 1 tablet (4 mg total) by mouth every 8 (eight) hours as needed for nausea or vomiting. Patient not taking: Reported on 11/29/2016 12/31/15   Greer Pickerel, MD    Physical Exam: Vitals:   11/28/16 2120 11/28/16 2215 11/28/16 2300 11/28/16 2320  BP: 134/65 (!) 141/70 127/79   Pulse: (!) 105 99 95   Resp: 20     Temp: (!) 102.5 F (39.2 C)   99.2 F (37.3 C)  TempSrc: Oral   Oral  SpO2: 98% 94% 95%     Constitutional: NAD, calm, comfortable Eyes: PERRL, lids and conjunctivae normal ENMT: Mucous membranes are moist. Posterior pharynx clear of any  exudate or lesions.Normal dentition.  Neck: normal, supple, no masses, no thyromegaly Respiratory: clear to auscultation bilaterally, no wheezing, no crackles. Normal respiratory effort. No accessory muscle use.  Cardiovascular: Regular rate and rhythm, no murmurs / rubs / gallops. No extremity edema. 2+ pedal pulses. No carotid bruits.  Abdomen: no tenderness, no masses palpated. No hepatosplenomegaly. Bowel sounds positive.  Musculoskeletal: no clubbing / cyanosis. No joint deformity upper and lower extremities. Good ROM, no contractures. Normal muscle tone.  Skin: no rashes, lesions, ulcers. No induration Neurologic: CN 2-12 grossly intact. Sensation intact, DTR normal. Strength 5/5 in all 4.  Psychiatric: Normal judgment and insight. Alert and oriented x 3. Normal mood.    Labs on Admission: I have personally reviewed following labs and imaging studies  CBC:  Recent Labs Lab 11/28/16 1953  WBC 18.0*  NEUTROABS 15.5*  HGB 16.2  HCT 47.9  MCV 90.0  PLT 409   Basic Metabolic Panel:  Recent Labs Lab 11/28/16 1953  NA 136  K 4.1  CL 102  CO2 23  GLUCOSE 96  BUN 11  CREATININE 1.21  CALCIUM 9.7   GFR: Estimated Creatinine Clearance: 96.6 mL/min (by C-G formula based on SCr of 1.21 mg/dL). Liver Function Tests:  Recent Labs Lab 11/28/16 1953  AST 47*  ALT 40  ALKPHOS 59  BILITOT 0.9  PROT 7.5  ALBUMIN 4.5   No results for input(s): LIPASE, AMYLASE in the last 168 hours. No results for input(s): AMMONIA in the last 168 hours. Coagulation Profile: No results for input(s): INR, PROTIME in the last 168 hours. Cardiac Enzymes: No results for input(s): CKTOTAL, CKMB, CKMBINDEX, TROPONINI in the last 168 hours. BNP (last 3 results) No results for input(s): PROBNP in the last 8760 hours. HbA1C: No results for input(s): HGBA1C in the last 72 hours. CBG: No results for input(s): GLUCAP in the last 168 hours. Lipid Profile: No results for input(s): CHOL, HDL,  LDLCALC, TRIG, CHOLHDL, LDLDIRECT in the last 72 hours. Thyroid Function Tests: No results for input(s): TSH, T4TOTAL, FREET4, T3FREE, THYROIDAB in the last 72 hours. Anemia Panel: No results for input(s): VITAMINB12, FOLATE, FERRITIN, TIBC, IRON, RETICCTPCT in the last 72 hours. Urine analysis:    Component Value Date/Time   COLORURINE STRAW (A) 11/28/2016 1956   APPEARANCEUR CLEAR 11/28/2016 1956   LABSPEC 1.006 11/28/2016 1956   PHURINE 5.0 11/28/2016 1956   GLUCOSEU NEGATIVE 11/28/2016 1956   HGBUR NEGATIVE 11/28/2016 1956   BILIRUBINUR NEGATIVE 11/28/2016 1956   KETONESUR NEGATIVE 11/28/2016 1956   PROTEINUR NEGATIVE 11/28/2016 1956   NITRITE NEGATIVE 11/28/2016 1956   LEUKOCYTESUR NEGATIVE 11/28/2016 1956    Radiological Exams on Admission: Ct Abdomen Pelvis W Contrast  Result Date: 11/29/2016 CLINICAL DATA:  Initial evaluation for acute periumbilical pain. EXAM: CT ABDOMEN AND PELVIS WITH CONTRAST TECHNIQUE: Multidetector CT imaging of the abdomen and pelvis was performed using the standard protocol following bolus administration of intravenous contrast. CONTRAST:  140mL ISOVUE-300 IOPAMIDOL (ISOVUE-300) INJECTION 61% COMPARISON:  None available. FINDINGS: Lower chest: Minimal atelectatic changes present within the visualized lung bases. Visualized lungs are otherwise clear. Hepatobiliary: Focal 1.8 cm hypodensity within the liver adjacent to the falciform ligament suspected to reflect focal fat deposition, not well seen on delayed sequence. Adjacent parenchymal calcification is similar to previous. Liver otherwise unremarkable. Gallbladder surgically absent. No biliary dilatation. Pancreas: Pancreas within normal limits. 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 Adrenals/Urinary Tract: Adrenal glands are normal. Kidneys equal in size with symmetric enhancement. No nephrolithiasis, hydronephrosis or focal enhancing renal mass. No hydroureter. Partially distended bladder within normal limits.  Stomach/Bowel: Stomach within normal limits. No evidence for bowel obstruction. Patient is status post partial colectomy. Appendix not visualized. Prominent inflammatory stranding about multiple sigmoid diverticula, consistent with acute diverticulitis. Small focus of extraluminal gas within this region consistent with associated micro perforation (series 3, image 70). No discrete abscess or other complication. Vascular/Lymphatic: Mild atheromatous plaque within the intra-abdominal aorta. Normal intravascular enhancement seen throughout the intra-abdominal aorta and its branch vessels. No pathologically enlarged intra-abdominal or pelvic lymph nodes. Reproductive: Prostate normal. Other: Small volume free fluid noted within the pelvis, likely reactive. Musculoskeletal: No acute osseus abnormality. No worrisome lytic or blastic osseous lesions. IMPRESSION: 1. Findings consistent with acute sigmoid diverticulitis. Few small foci of extraluminal gas within this region consistent with associated perforation. No abscess. 2. No other acute intra-abdominopelvic process. 3. Vague 1.8 cm hypodensity within the liver adjacent to the falciform ligament, most likely focal fat deposition. Attention at follow-up recommended. 4. Status post partial colectomy, splenectomy, and cholecystectomy. Critical Value/emergent results were called by telephone at the time of interpretation on 11/29/2016 at 12:10 am to Dr. Montine Circle , who verbally acknowledged these results. Electronically Signed   By: Jeannine Boga M.D.   On: 11/28/2016 23:59    EKG: Independently reviewed.  Assessment/Plan Principal Problem:   Diverticulitis of colon with perforation Active Problems:   Cancer of ascending colon ([place])   Lesion of liver    1. Diverticulitis with microperf - 1. Zosyn 2. NPO for now 3. IVF 2. H/o Colon cancer - was stage 2A believed to be in remission at this point 1. Will check CEA 3. Lesion of liver - probably  focal fatty infiltration (maybe scar from where they took out his gallbladder?).  Keep eye on this and mention during follow up appointment with Dr. Benay Spice. 1. Will write a note in Epic to Dr. Benay Spice (will also Copy Dr. Ardis Hughs to let him know patient being admitted with diverticulitis with microperf which I suspect may delay colonoscopy that was scheduled next week). 2. PET scan?  DVT prophylaxis: Lovenox Code Status: Full Family Communication: No family in room Disposition Plan: Home after admit Consults called: None Admission status: Admit to inpatient   Etta Quill DO [facility] Pager [phone]  If 7AM-7PM, please contact day team taking care of patient [email or web address] Password [email or web address]  11/29/2016, 1:18 AM

## 2016-11-29 NOTE — Telephone Encounter (Signed)
12/27/16 130 pm appt with Amy colon cancelled pt will be given appt at discharge

## 2016-11-29 NOTE — Telephone Encounter (Signed)
-----   Message from Milus Banister, MD sent at 11/29/2016  9:26 AM EDT ----- Regarding: RE: Incedental 1.8cm liver lesion on CT on patient with h/o colon cancer Ok, thanks for the info. Will cancel his next week colonoscopy.  Will get him in my office in 5-6 weeks to discuss rescheduling it.   Troy Lambert, He's in hospital now with diverticulitis.  Need to cancel his upcoming colonocopy and instead put him in for ROV in 4-5 weeks (me or extender).  Thanks  dj  ----- Message ----- From: Etta Quill, DO Sent: 11/29/2016   1:23 AM To: Milus Banister, MD, Ladell Pier, MD Subject: Troy Lambert 1.8cm liver lesion on CT on patie#  FYI:  See CT results.  Probably just fatty infiltration per the official read, maybe scaring from where they took out gallbladder?  But did want to mention / flag it in this young patient with h/o Colon cancer stage 2A last year s/p resection.  Believe he has next follow up appointment with you in June.  Will check a CEA.  ? Any need / roll for PET scan to evaluate that spot?  Was supposed to have surveillance colonoscopy next week with GI, but I suspect this will probably be delayed given that he is being admitted for diverticulitis with microperforation.

## 2016-11-29 NOTE — Progress Notes (Signed)
Pharmacy Antibiotic Note  Troy Lambert. is a 46 y.o. male admitted on 11/28/2016 with fever and abd pain.  Pharmacy has been consulted for Zosyn dosing.  Plan: Zosyn 3.375gm IV q8h - doses over 4 hours Will f/u micro data, renal function, and pt's clinical condition     Temp (24hrs), Avg:101.2 F (38.4 C), Min:99.2 F (37.3 C), Max:102.5 F (39.2 C)   Recent Labs Lab 11/28/16 1953 11/28/16 2014 11/28/16 2309  WBC 18.0*  --   --   CREATININE 1.21  --   --   LATICACIDVEN  --  1.57 1.08    Estimated Creatinine Clearance: 96.6 mL/min (by C-G formula based on SCr of 1.21 mg/dL).    Allergies  Allergen Reactions  . Dilaudid [Hydromorphone Hcl] Itching    Antimicrobials this admission: 5/16 Vanc x 1  5/16 Zosyn >>   Microbiology results: 5/16 BCx x2:   Thank you for allowing pharmacy to be a part of this patient's care.  Sherlon Handing, PharmD, BCPS Clinical pharmacist, pager [phone] 11/29/2016 12:46 AM

## 2016-11-29 NOTE — Progress Notes (Signed)
Patient ID: Troy Lambert., male   DOB: 09/27/1970, 46 y.o.   MRN: [id number]   Given his exam this afternoon, i'm going to let him start liquids

## 2016-11-29 NOTE — Progress Notes (Signed)
PROGRESS NOTE                                                                                                                                                                                                             Patient Demographics:    Troy Lambert, is a 46 y.o. male, DOB - 10-04-1970, TJQ:[id number]  Admit date - 11/28/2016   Admitting Physician Etta Quill, DO  Outpatient Primary MD for the patient is Everardo Beals, NP  LOS - 0  Chief Complaint  Patient presents with  . Fever  . Abdominal Pain  . Dysuria       Brief Narrative   46 y.o. 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  Patient presents to the ED with c/o abd pain.  Symptoms onset yesterday, lower abdomen.  Fever 102.7   Subjective:    Levander Campion today has, No headache, No chest pain, Mild lower quadrant abdominal pain - No Nausea, No new weakness tingling or numbness, No Cough - SOB.     Assessment  & Plan :     1.Sepsis due to acute diverticulitis with microperforation. Sepsis physiology seems to have resolved, being treated conservatively with bowel rest, IV fluids and IV antibiotics, follow cultures, Gen. surgery on board. Clinically much better.  2. History of colon cancer. Status post hemicolectomy, patient follows with Dr. Ardis Hughs GI, Dr. Greer Pickerel surgery and Dr. Blair Promise oncology, per patient cancer was localized and all of it was removed during surgery. He had no chemotherapy or radiation treatments. CEA levels are pending.  3. Nonspecific liver hypodensity. Likely fat deposition, outpatient follow-up with PCP and oncologist post discharge.    Diet : Diet NPO time specified Except for: Sips with Meds    Family Communication  :  daughter  Code Status :  Full  Disposition Plan  :  Home 2-3 days  Consults  :  CCS  Procedures  :    CT abdomen  pelvis-acute diverticulitis with microperforation, vague liver hypodensity likely fat deposition.  DVT Prophylaxis  :  Lovenox    Lab Results  Component Value Date   PLT 317 11/29/2016    Inpatient Medications  Scheduled Meds: . enoxaparin (LOVENOX) injection  40 mg Subcutaneous Daily  . multivitamin with minerals  1 tablet Oral Daily   Continuous Infusions: . sodium chloride    . piperacillin-tazobactam (ZOSYN)  IV 3.375 g (11/29/16 0606)   PRN Meds:.acetaminophen **OR** [DISCONTINUED] acetaminophen, diphenhydrAMINE, fluticasone, HYDROcodone-acetaminophen, loratadine, [DISCONTINUED] ondansetron **OR** ondansetron (ZOFRAN) IV, zolpidem  Antibiotics  :    Anti-infectives    Start     Dose/Rate Route Frequency Ordered Stop   11/29/16 0600  piperacillin-tazobactam (ZOSYN) IVPB 3.375 g     3.375 g 12.5 mL/hr over 240 Minutes Intravenous Every 8 hours 11/29/16 0049     11/28/16 2245  piperacillin-tazobactam (ZOSYN) IVPB 3.375 g     3.375 g 100 mL/hr over 30 Minutes Intravenous  Once 11/28/16 2242 11/28/16 2342   11/28/16 2245  vancomycin (VANCOCIN) IVPB 1000 mg/200 mL premix  Status:  Discontinued     1,000 mg 200 mL/hr over 60 Minutes Intravenous  Once 11/28/16 2242 11/28/16 2243   11/28/16 2245  vancomycin (VANCOCIN) 2,000 mg in sodium chloride 0.9 % 500 mL IVPB     2,000 mg 250 mL/hr over 120 Minutes Intravenous  Once 11/28/16 2243 11/29/16 0126         Objective:   Vitals:   11/29/16 0443 11/29/16 0600 11/29/16 0800 11/29/16 0956  BP: (!) 125/59   115/67  Pulse: 93   71  Resp: 20   18  Temp: 100.2 F (37.9 C) 98.3 F (36.8 C)  98.4 F (36.9 C)  TempSrc: Oral Oral  Oral  SpO2: 91%   95%  Weight:      Height:   6\' 2"  (1.88 m)     Wt Readings from Last 3 Encounters:  11/29/16 100.6 kg (221 lb 12.8 oz)  11/16/16 103 kg (227 lb)  07/12/16 107.5 kg (236 lb 14.4 oz)     Intake/Output Summary (Last 24 hours) at 11/29/16 1051 Last data filed at 11/29/16 0855   Gross per 24 hour  Intake          2102.08 ml  Output                0 ml  Net          2102.08 ml     Physical Exam  Awake Alert, Oriented X 3, No new F.N deficits, Normal affect [place].AT,PERRAL Supple Neck,No JVD, No cervical lymphadenopathy appriciated.  Symmetrical Chest wall movement, Good air movement bilaterally, CTAB RRR,No Gallops,Rubs or new Murmurs, No Parasternal Heave +ve B.Sounds, Abd Soft, Mild lower quadrant tenderness, No organomegaly appriciated, No rebound - guarding or rigidity. No Cyanosis, Clubbing or edema, No new Rash or bruise       Data Review:    CBC  Recent Labs Lab 11/28/16 1953 11/29/16 0115  WBC 18.0* 19.6*  HGB 16.2 15.1  HCT 47.9 45.3  PLT 321 317  MCV 90.0 90.2  MCH 30.5 30.1  MCHC 33.8 33.3  RDW 16.7* 16.6*  LYMPHSABS 1.3  --   MONOABS 1.2*  --   EOSABS 0.1  --   BASOSABS 0.0  --     Chemistries   Recent Labs Lab 11/28/16 1953 11/29/16 0115  NA 136 136  K 4.1 4.5  CL 102 105  CO2 23 23  GLUCOSE 96 114*  BUN 11 9  CREATININE 1.21 1.18  CALCIUM 9.7 8.6*  AST 47*  --   ALT 40  --   ALKPHOS 59  --   BILITOT 0.9  --    ------------------------------------------------------------------------------------------------------------------ No results for input(s): CHOL, HDL, LDLCALC, TRIG, CHOLHDL, LDLDIRECT  in the last 72 hours.  No results found for: HGBA1C ------------------------------------------------------------------------------------------------------------------ No results for input(s): TSH, T4TOTAL, T3FREE, THYROIDAB in the last 72 hours.  Invalid input(s): FREET3 ------------------------------------------------------------------------------------------------------------------ No results for input(s): VITAMINB12, FOLATE, FERRITIN, TIBC, IRON, RETICCTPCT in the last 72 hours.  Coagulation profile No results for input(s): INR, PROTIME in the last 168 hours.  No results for input(s): DDIMER in the last 72  hours.  Cardiac Enzymes No results for input(s): CKMB, TROPONINI, MYOGLOBIN in the last 168 hours.  Invalid input(s): CK ------------------------------------------------------------------------------------------------------------------ No results found for: BNP  Micro Results No results found for this or any previous visit (from the past 240 hour(s)).  Radiology Reports Ct Abdomen Pelvis W Contrast  Result Date: 11/29/2016 CLINICAL DATA:  Initial evaluation for acute periumbilical pain. EXAM: CT ABDOMEN AND PELVIS WITH CONTRAST TECHNIQUE: Multidetector CT imaging of the abdomen and pelvis was performed using the standard protocol following bolus administration of intravenous contrast. CONTRAST:  112mL ISOVUE-300 IOPAMIDOL (ISOVUE-300) INJECTION 61% COMPARISON:  None available. FINDINGS: Lower chest: Minimal atelectatic changes present within the visualized lung bases. Visualized lungs are otherwise clear. Hepatobiliary: Focal 1.8 cm hypodensity within the liver adjacent to the falciform ligament suspected to reflect focal fat deposition, not well seen on delayed sequence. Adjacent parenchymal calcification is similar to previous. Liver otherwise unremarkable. Gallbladder surgically absent. No biliary dilatation. Pancreas: Pancreas within normal limits. 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 Adrenals/Urinary Tract: Adrenal glands are normal. Kidneys equal in size with symmetric enhancement. No nephrolithiasis, hydronephrosis or focal enhancing renal mass. No hydroureter. Partially distended bladder within normal limits. Stomach/Bowel: Stomach within normal limits. No evidence for bowel obstruction. Patient is status post partial colectomy. Appendix not visualized. Prominent inflammatory stranding about multiple sigmoid diverticula, consistent with acute diverticulitis. Small focus of extraluminal gas within this region consistent with associated micro perforation (series 3, image 70). No discrete abscess or other  complication. Vascular/Lymphatic: Mild atheromatous plaque within the intra-abdominal aorta. Normal intravascular enhancement seen throughout the intra-abdominal aorta and its branch vessels. No pathologically enlarged intra-abdominal or pelvic lymph nodes. Reproductive: Prostate normal. Other: Small volume free fluid noted within the pelvis, likely reactive. Musculoskeletal: No acute osseus abnormality. No worrisome lytic or blastic osseous lesions. IMPRESSION: 1. Findings consistent with acute sigmoid diverticulitis. Few small foci of extraluminal gas within this region consistent with associated perforation. No abscess. 2. No other acute intra-abdominopelvic process. 3. Vague 1.8 cm hypodensity within the liver adjacent to the falciform ligament, most likely focal fat deposition. Attention at follow-up recommended. 4. Status post partial colectomy, splenectomy, and cholecystectomy. Critical Value/emergent results were called by telephone at the time of interpretation on 11/29/2016 at 12:10 am to Dr. Montine Circle , who verbally acknowledged these results. Electronically Signed   By: Jeannine Boga M.D.   On: 11/28/2016 23:59    Time Spent in minutes  30   Lala Lund M.D on 11/29/2016 at 10:51 AM  Between 7am to 7pm - Pager - [phone] ( page via [email or web address], text pages only, please mention full 10 digit call back number). After 7pm go to [email or web address] - password [email or web address]

## 2016-11-30 DIAGNOSIS — C182 Malignant neoplasm of ascending colon: Secondary | ICD-10-CM

## 2016-11-30 DIAGNOSIS — K769 Liver disease, unspecified: Secondary | ICD-10-CM

## 2016-11-30 LAB — BASIC METABOLIC PANEL
ANION GAP: 8 (ref 5–15)
BUN: <5 mg/dL — ABNORMAL LOW (ref 6–20)
CHLORIDE: 106 mmol/L (ref 101–111)
CO2: 23 mmol/L (ref 22–32)
Calcium: 8.2 mg/dL — ABNORMAL LOW (ref 8.9–10.3)
Creatinine, Ser: 1.02 mg/dL (ref 0.61–1.24)
GFR calc Af Amer: >60 mL/min (ref >60)
GLUCOSE: 96 mg/dL (ref 65–99)
POTASSIUM: 3.5 mmol/L (ref 3.5–5.1)
Sodium: 137 mmol/L (ref 135–145)

## 2016-11-30 LAB — CBC
HEMATOCRIT: 40.4 % (ref 39.0–52.0)
HEMOGLOBIN: 13.6 g/dL (ref 13.0–17.0)
MCH: 30.4 pg (ref 26.0–34.0)
MCHC: 33.7 g/dL (ref 30.0–36.0)
MCV: 90.2 fL (ref 78.0–100.0)
PLATELETS: 293 10*3/uL (ref 150–400)
RBC: 4.48 MIL/uL (ref 4.22–5.81)
RDW: 17 % — ABNORMAL HIGH (ref 11.5–15.5)
WBC: 15.5 10*3/uL — AB (ref 4.0–10.5)

## 2016-11-30 MED ORDER — SODIUM CHLORIDE 0.9 % IV SOLN
INTRAVENOUS | Status: DC
  Administered 2016-11-30 – 2016-12-01 (×2): via INTRAVENOUS

## 2016-11-30 NOTE — Progress Notes (Signed)
[facility] Progress Note     Subjective: CC: diverticulitis Pain improving. Denies pain at rest but endorses discomfort and bloating. Tolerating clear liquids without nausea or vomiting. Had 3, non-bloody, loose BMs overnight. Denies fever/chills. Eager to go home.  Afebrile, VSS Objective: Vital signs in last 24 hours: Temp:  [98.4 F (36.9 C)-98.9 F (37.2 C)] 98.7 F (37.1 C) (05/18 0436) Pulse Rate:  [71-81] 79 (05/18 0436) Resp:  [17-18] 17 (05/18 0436) BP: (115-137)/(67-83) 131/77 (05/18 0436) SpO2:  [93 %-96 %] 93 % (05/18 0436) Weight:  [102.2 kg (225 lb 3.2 oz)] 102.2 kg (225 lb 3.2 oz) (05/17 2121)    Intake/Output from previous day: 05/17 0701 - 05/18 0700 In: 2068.8 [P.O.:600; I.V.:1368.8; IV Piggyback:100] Out: 200 [Urine:200] Intake/Output this shift: No intake/output data recorded.  PE: Gen:  Alert, NAD, pleasant HEENT: EOM's in tact, no scleral icterus  Card:  Regular rate and rhythm, pedal pulses 2+ BL Pulm:  Normal effort, clear to auscultation bilaterally Abd: Soft, mild tenderness to deep palpation LLQ without guarding, bowel sounds present in all 4 quadrants Skin: warm and dry, no rashes  Psych: A&Ox3   Lab Results:   Recent Labs  11/29/16 0115 11/30/16 0622  WBC 19.6* 15.5*  HGB 15.1 13.6  HCT 45.3 40.4  PLT 317 293   BMET  Recent Labs  11/29/16 0115 11/30/16 0622  NA 136 137  K 4.5 3.5  CL 105 106  CO2 23 23  GLUCOSE 114* 96  BUN 9 <5*  CREATININE 1.18 1.02  CALCIUM 8.6* 8.2*   PT/INR No results for input(s): LABPROT, INR in the last 72 hours. CMP     Component Value Date/Time   NA 137 11/30/2016 0622   K 3.5 11/30/2016 0622   CL 106 11/30/2016 0622   CO2 23 11/30/2016 0622   GLUCOSE 96 11/30/2016 0622   BUN <5 (L) 11/30/2016 0622   CREATININE 1.02 11/30/2016 0622   CALCIUM 8.2 (L) 11/30/2016 0622   PROT 7.5 11/28/2016 1953   ALBUMIN 4.5 11/28/2016 1953   AST 47 (H) 11/28/2016 1953   ALT 40 11/28/2016  1953   ALKPHOS 59 11/28/2016 1953   BILITOT 0.9 11/28/2016 1953   GFRNONAA >60 11/30/2016 0622   GFRAA >60 11/30/2016 0622   Lipase  No results found for: LIPASE     Studies/Results: Ct Abdomen Pelvis W Contrast  Result Date: 11/29/2016 CLINICAL DATA:  Initial evaluation for acute periumbilical pain. EXAM: CT ABDOMEN AND PELVIS WITH CONTRAST TECHNIQUE: Multidetector CT imaging of the abdomen and pelvis was performed using the standard protocol following bolus administration of intravenous contrast. CONTRAST:  142mL ISOVUE-300 IOPAMIDOL (ISOVUE-300) INJECTION 61% COMPARISON:  None available. FINDINGS: Lower chest: Minimal atelectatic changes present within the visualized lung bases. Visualized lungs are otherwise clear. Hepatobiliary: Focal 1.8 cm hypodensity within the liver adjacent to the falciform ligament suspected to reflect focal fat deposition, not well seen on delayed sequence. Adjacent parenchymal calcification is similar to previous. Liver otherwise unremarkable. Gallbladder surgically absent. No biliary dilatation. Pancreas: Pancreas within normal limits. 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 Adrenals/Urinary Tract: Adrenal glands are normal. Kidneys equal in size with symmetric enhancement. No nephrolithiasis, hydronephrosis or focal enhancing renal mass. No hydroureter. Partially distended bladder within normal limits. Stomach/Bowel: Stomach within normal limits. No evidence for bowel obstruction. Patient is status post partial colectomy. Appendix not visualized. Prominent inflammatory stranding about multiple sigmoid diverticula, consistent with acute diverticulitis. Small focus of extraluminal gas within this region consistent with associated  micro perforation (series 3, image 70). No discrete abscess or other complication. Vascular/Lymphatic: Mild atheromatous plaque within the intra-abdominal aorta. Normal intravascular enhancement seen throughout the intra-abdominal aorta and its branch vessels.  No pathologically enlarged intra-abdominal or pelvic lymph nodes. Reproductive: Prostate normal. Other: Small volume free fluid noted within the pelvis, likely reactive. Musculoskeletal: No acute osseus abnormality. No worrisome lytic or blastic osseous lesions. IMPRESSION: 1. Findings consistent with acute sigmoid diverticulitis. Few small foci of extraluminal gas within this region consistent with associated perforation. No abscess. 2. No other acute intra-abdominopelvic process. 3. Vague 1.8 cm hypodensity within the liver adjacent to the falciform ligament, most likely focal fat deposition. Attention at follow-up recommended. 4. Status post partial colectomy, splenectomy, and cholecystectomy. Critical Value/emergent results were called by telephone at the time of interpretation on 11/29/2016 at 12:10 am to Dr. Montine Circle , who verbally acknowledged these results. Electronically Signed   By: Jeannine Boga M.D.   On: 11/28/2016 23:59    Anti-infectives: Anti-infectives    Start     Dose/Rate Route Frequency Ordered Stop   11/29/16 0600  piperacillin-tazobactam (ZOSYN) IVPB 3.375 g     3.375 g 12.5 mL/hr over 240 Minutes Intravenous Every 8 hours 11/29/16 0049     11/28/16 2245  piperacillin-tazobactam (ZOSYN) IVPB 3.375 g     3.375 g 100 mL/hr over 30 Minutes Intravenous  Once 11/28/16 2242 11/28/16 2342   11/28/16 2245  vancomycin (VANCOCIN) IVPB 1000 mg/200 mL premix  Status:  Discontinued     1,000 mg 200 mL/hr over 60 Minutes Intravenous  Once 11/28/16 2242 11/28/16 2243   11/28/16 2245  vancomycin (VANCOCIN) 2,000 mg in sodium chloride 0.9 % 500 mL IVPB     2,000 mg 250 mL/hr over 120 Minutes Intravenous  Once 11/28/16 2243 11/29/16 0126     Assessment/Plan Acute sigmoid diverticulitis with microperforation  - second known episode of diverticulitis, first episode treated outpatient with PO abx - clinically improving, minimal tenderness on exam. Non-toxic appearing. - WBC  15.5, improving; continue IV abx today  - advance diet - Patient significantly improved compared to yesterday. I suspect he will stable for discharge tomorrow morning on 10 days of Augmentin. He would like to follow up outpatient with Dr. Greer Pickerel.  PMH Colon CA - s/p Right colectomy 12/2015. no known recurrence, CEA pending  Liver lesion   FEN- advance to full liquid diet, SOFT in AM.  ID- Zosyn 5/17 >>  VTE - lovenox, SCD's    LOS: 1 day    Jill Alexanders , [facility] 11/30/2016, 8:59 AM Pager: [phone] Consults: [phone] Mon-Fri 7:00 am-4:30 pm Sat-Sun 7:00 am-11:30 am

## 2016-11-30 NOTE — Progress Notes (Signed)
Patient is requesting to take a shower. MD notified. Verbal order to D/C cardiac monitoring and place order that patient may shower. Orders placed. Will continue to monitor.

## 2016-11-30 NOTE — Progress Notes (Signed)
PROGRESS NOTE                                                                                                                                                                                                             Patient Demographics:    Troy Lambert, is a 46 y.o. male, DOB - [birth year], [id number]  Admit date - 11/28/2016   Admitting Physician Etta Quill, DO  Outpatient Primary MD for the patient is Everardo Beals, NP  LOS - 1  Chief Complaint  Patient presents with  . Fever  . Abdominal Pain  . Dysuria       Brief Narrative   46 y.o. 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  Patient presents to the ED with c/o abd pain.  Symptoms onset yesterday, lower abdomen.  Fever 102.7   Subjective:   Patient in bed, appears comfortable, denies any headache, no fever, no chest pain or pressure, no shortness of breath , minimal to no abdominal pain. No focal weakness.    Assessment  & Plan :     1.Sepsis due to acute diverticulitis with microperforation. Sepsis physiology has completely resolved, he appears nontoxic, Gen. surgery on board, diet being gradually increased, continue IV fluids and IV antibiotics. Monitor cultures. Exam benign this morning.  2. History of colon cancer. Status post hemicolectomy, patient follows with Dr. Ardis Hughs GI, Dr. Greer Pickerel surgery and Dr. Blair Promise oncology, per patient cancer was localized and all of it was removed during surgery. He had no chemotherapy or radiation treatments. CEA levels are pending.  3. Nonspecific liver hypodensity. Likely fat deposition, outpatient follow-up with PCP and oncologist post discharge.    Diet : Diet full liquid Room service appropriate? Yes; Fluid consistency: Thin DIET SOFT Room service appropriate? Yes; Fluid consistency: Thin    Family Communication  :   daughter  Code Status :  Full  Disposition Plan  :  Home 2-3 days  Consults  :  CCS  Procedures  :    CT abdomen pelvis-acute diverticulitis with microperforation, vague liver hypodensity likely fat deposition.  DVT Prophylaxis  :  Lovenox    Lab Results  Component Value Date   PLT 293 11/30/2016    Inpatient Medications  Scheduled Meds: . enoxaparin (LOVENOX) injection  40 mg Subcutaneous Daily  . multivitamin with minerals  1 tablet Oral Daily   Continuous Infusions: . sodium chloride    . piperacillin-tazobactam (ZOSYN)  IV Stopped (11/30/16 1002)   PRN Meds:.acetaminophen **OR** [DISCONTINUED] acetaminophen, diphenhydrAMINE, fluticasone, HYDROcodone-acetaminophen, loratadine, [DISCONTINUED] ondansetron **OR** ondansetron (ZOFRAN) IV, zolpidem  Antibiotics  :    Anti-infectives    Start     Dose/Rate Route Frequency Ordered Stop   11/29/16 0600  piperacillin-tazobactam (ZOSYN) IVPB 3.375 g     3.375 g 12.5 mL/hr over 240 Minutes Intravenous Every 8 hours 11/29/16 0049     11/28/16 2245  piperacillin-tazobactam (ZOSYN) IVPB 3.375 g     3.375 g 100 mL/hr over 30 Minutes Intravenous  Once 11/28/16 2242 11/28/16 2342   11/28/16 2245  vancomycin (VANCOCIN) IVPB 1000 mg/200 mL premix  Status:  Discontinued     1,000 mg 200 mL/hr over 60 Minutes Intravenous  Once 11/28/16 2242 11/28/16 2243   11/28/16 2245  vancomycin (VANCOCIN) 2,000 mg in sodium chloride 0.9 % 500 mL IVPB     2,000 mg 250 mL/hr over 120 Minutes Intravenous  Once 11/28/16 2243 11/29/16 0126         Objective:   Vitals:   11/29/16 0956 11/29/16 1711 11/29/16 2121 11/30/16 0436  BP: 115/67 137/83 133/74 131/77  Pulse: 71 77 81 79  Resp: 18 18 18 17   Temp: 98.4 F (36.9 C) 98.9 F (37.2 C) 98.8 F (37.1 C) 98.7 F (37.1 C)  TempSrc: Oral Oral Oral Oral  SpO2: 95% 96% 95% 93%  Weight:   102.2 kg (225 lb 3.2 oz)   Height:   6\' 3"  (1.905 m)     Wt Readings from Last 3 Encounters:  11/29/16  102.2 kg (225 lb 3.2 oz)  11/16/16 103 kg (227 lb)  07/12/16 107.5 kg (236 lb 14.4 oz)     Intake/Output Summary (Last 24 hours) at 11/30/16 1126 Last data filed at 11/30/16 0500  Gross per 24 hour  Intake          2068.75 ml  Output              200 ml  Net          1868.75 ml     Physical Exam  Awake Alert, Oriented X 3, No new F.N deficits, Normal affect [place].AT,PERRAL Supple Neck,No JVD, No cervical lymphadenopathy appriciated.  Symmetrical Chest wall movement, Good air movement bilaterally, CTAB RRR,No Gallops,Rubs or new Murmurs, No Parasternal Heave +ve B.Sounds, Abd Soft, No tenderness, No organomegaly appriciated, No rebound - guarding or rigidity. No Cyanosis, Clubbing or edema, No new Rash or bruise       Data Review:    CBC  Recent Labs Lab 11/28/16 1953 11/29/16 0115 11/30/16 0622  WBC 18.0* 19.6* 15.5*  HGB 16.2 15.1 13.6  HCT 47.9 45.3 40.4  PLT 321 317 293  MCV 90.0 90.2 90.2  MCH 30.5 30.1 30.4  MCHC 33.8 33.3 33.7  RDW 16.7* 16.6* 17.0*  LYMPHSABS 1.3  --   --   MONOABS 1.2*  --   --   EOSABS 0.1  --   --   BASOSABS 0.0  --   --     Chemistries   Recent Labs Lab 11/28/16 1953 11/29/16 0115 11/30/16 0622  NA 136 136 137  K 4.1 4.5 3.5  CL 102 105 106  CO2 23 23 23   GLUCOSE 96 114* 96  BUN 11 9 <5*  CREATININE 1.21  1.18 1.02  CALCIUM 9.7 8.6* 8.2*  AST 47*  --   --   ALT 40  --   --   ALKPHOS 59  --   --   BILITOT 0.9  --   --    ------------------------------------------------------------------------------------------------------------------ No results for input(s): CHOL, HDL, LDLCALC, TRIG, CHOLHDL, LDLDIRECT in the last 72 hours.  No results found for: HGBA1C ------------------------------------------------------------------------------------------------------------------ No results for input(s): TSH, T4TOTAL, T3FREE, THYROIDAB in the last 72 hours.  Invalid input(s):  FREET3 ------------------------------------------------------------------------------------------------------------------ No results for input(s): VITAMINB12, FOLATE, FERRITIN, TIBC, IRON, RETICCTPCT in the last 72 hours.  Coagulation profile No results for input(s): INR, PROTIME in the last 168 hours.  No results for input(s): DDIMER in the last 72 hours.  Cardiac Enzymes No results for input(s): CKMB, TROPONINI, MYOGLOBIN in the last 168 hours.  Invalid input(s): CK ------------------------------------------------------------------------------------------------------------------ No results found for: BNP  Micro Results No results found for this or any previous visit (from the past 240 hour(s)).  Radiology Reports Ct Abdomen Pelvis W Contrast  Result Date: 11/29/2016 CLINICAL DATA:  Initial evaluation for acute periumbilical pain. EXAM: CT ABDOMEN AND PELVIS WITH CONTRAST TECHNIQUE: Multidetector CT imaging of the abdomen and pelvis was performed using the standard protocol following bolus administration of intravenous contrast. CONTRAST:  162mL ISOVUE-300 IOPAMIDOL (ISOVUE-300) INJECTION 61% COMPARISON:  None available. FINDINGS: Lower chest: Minimal atelectatic changes present within the visualized lung bases. Visualized lungs are otherwise clear. Hepatobiliary: Focal 1.8 cm hypodensity within the liver adjacent to the falciform ligament suspected to reflect focal fat deposition, not well seen on delayed sequence. Adjacent parenchymal calcification is similar to previous. Liver otherwise unremarkable. Gallbladder surgically absent. No biliary dilatation. Pancreas: Pancreas within normal limits. 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 Adrenals/Urinary Tract: Adrenal glands are normal. Kidneys equal in size with symmetric enhancement. No nephrolithiasis, hydronephrosis or focal enhancing renal mass. No hydroureter. Partially distended bladder within normal limits. Stomach/Bowel: Stomach within normal  limits. No evidence for bowel obstruction. Patient is status post partial colectomy. Appendix not visualized. Prominent inflammatory stranding about multiple sigmoid diverticula, consistent with acute diverticulitis. Small focus of extraluminal gas within this region consistent with associated micro perforation (series 3, image 70). No discrete abscess or other complication. Vascular/Lymphatic: Mild atheromatous plaque within the intra-abdominal aorta. Normal intravascular enhancement seen throughout the intra-abdominal aorta and its branch vessels. No pathologically enlarged intra-abdominal or pelvic lymph nodes. Reproductive: Prostate normal. Other: Small volume free fluid noted within the pelvis, likely reactive. Musculoskeletal: No acute osseus abnormality. No worrisome lytic or blastic osseous lesions. IMPRESSION: 1. Findings consistent with acute sigmoid diverticulitis. Few small foci of extraluminal gas within this region consistent with associated perforation. No abscess. 2. No other acute intra-abdominopelvic process. 3. Vague 1.8 cm hypodensity within the liver adjacent to the falciform ligament, most likely focal fat deposition. Attention at follow-up recommended. 4. Status post partial colectomy, splenectomy, and cholecystectomy. Critical Value/emergent results were called by telephone at the time of interpretation on 11/29/2016 at 12:10 am to Dr. Montine Circle , who verbally acknowledged these results. Electronically Signed   By: Jeannine Boga M.D.   On: 11/28/2016 23:59    Time Spent in minutes  30   Lala Lund M.D on 11/30/2016 at 11:26 AM  Between 7am to 7pm - Pager - [phone] ( page via [email or web address], text pages only, please mention full 10 digit call back number). After 7pm go to [email or web address] - password [email or web address]

## 2016-12-01 LAB — BASIC METABOLIC PANEL
ANION GAP: 8 (ref 5–15)
BUN: <5 mg/dL — ABNORMAL LOW (ref 6–20)
CO2: 24 mmol/L (ref 22–32)
CREATININE: 1.11 mg/dL (ref 0.61–1.24)
Calcium: 8.4 mg/dL — ABNORMAL LOW (ref 8.9–10.3)
Chloride: 104 mmol/L (ref 101–111)
Glucose, Bld: 93 mg/dL (ref 65–99)
Potassium: 3.8 mmol/L (ref 3.5–5.1)
SODIUM: 136 mmol/L (ref 135–145)

## 2016-12-01 LAB — CBC
HCT: 40.1 % (ref 39.0–52.0)
Hemoglobin: 13.3 g/dL (ref 13.0–17.0)
MCH: 29.8 pg (ref 26.0–34.0)
MCHC: 33.2 g/dL (ref 30.0–36.0)
MCV: 89.7 fL (ref 78.0–100.0)
PLATELETS: 321 10*3/uL (ref 150–400)
RBC: 4.47 MIL/uL (ref 4.22–5.81)
RDW: 16.8 % — AB (ref 11.5–15.5)
WBC: 13.5 10*3/uL — AB (ref 4.0–10.5)

## 2016-12-01 LAB — CEA: CEA: 3 ng/mL (ref 0.0–4.7)

## 2016-12-01 NOTE — Progress Notes (Signed)
[facility]/Trauma Progress Note      Subjective:  CC: Abdominal pain  Pain is improved. Mild pain with soft food but no pain with breakfast. Had loose BM today. No acute events overnight.   Objective: Vital signs in last 24 hours: Temp:  [98.4 F (36.9 C)-99.1 F (37.3 C)] 98.8 F (37.1 C) (05/19 0429) Pulse Rate:  [74-77] 77 (05/19 0429) Resp:  [16] 16 (05/19 0429) BP: (127-145)/(73-81) 127/73 (05/19 0429) SpO2:  [93 %-97 %] 93 % (05/19 0429) Weight:  [223 lb 1.6 oz (101.2 kg)] 223 lb 1.6 oz (101.2 kg) (05/18 2054) Last BM Date: 11/30/16  Intake/Output from previous day: 05/18 0701 - 05/19 0700 In: 2930.8 [P.O.:1920; I.V.:910.8; IV Piggyback:100] Out: 500 [Urine:500] Intake/Output this shift: No intake/output data recorded.  PE: Gen:  Alert, NAD, pleasant, cooperative, well appearing Pulm: rate and effort normal Abd: Soft, not distended Skin: no rashes noted, warm and dry  Lab Results:   Recent Labs  11/30/16 0622 12/01/16 0452  WBC 15.5* 13.5*  HGB 13.6 13.3  HCT 40.4 40.1  PLT 293 321   BMET  Recent Labs  11/30/16 0622 12/01/16 0452  NA 137 136  K 3.5 3.8  CL 106 104  CO2 23 24  GLUCOSE 96 93  BUN <5* <5*  CREATININE 1.02 1.11  CALCIUM 8.2* 8.4*   PT/INR No results for input(s): LABPROT, INR in the last 72 hours. CMP     Component Value Date/Time   NA 136 12/01/2016 0452   K 3.8 12/01/2016 0452   CL 104 12/01/2016 0452   CO2 24 12/01/2016 0452   GLUCOSE 93 12/01/2016 0452   BUN <5 (L) 12/01/2016 0452   CREATININE 1.11 12/01/2016 0452   CALCIUM 8.4 (L) 12/01/2016 0452   PROT 7.5 11/28/2016 1953   ALBUMIN 4.5 11/28/2016 1953   AST 47 (H) 11/28/2016 1953   ALT 40 11/28/2016 1953   ALKPHOS 59 11/28/2016 1953   BILITOT 0.9 11/28/2016 1953   GFRNONAA >60 12/01/2016 0452   GFRAA >60 12/01/2016 0452   Lipase  No results found for: LIPASE  Studies/Results: No results found.  Anti-infectives: Anti-infectives    Start      Dose/Rate Route Frequency Ordered Stop   11/29/16 0600  piperacillin-tazobactam (ZOSYN) IVPB 3.375 g     3.375 g 12.5 mL/hr over 240 Minutes Intravenous Every 8 hours 11/29/16 0049     11/28/16 2245  piperacillin-tazobactam (ZOSYN) IVPB 3.375 g     3.375 g 100 mL/hr over 30 Minutes Intravenous  Once 11/28/16 2242 11/28/16 2342   11/28/16 2245  vancomycin (VANCOCIN) IVPB 1000 mg/200 mL premix  Status:  Discontinued     1,000 mg 200 mL/hr over 60 Minutes Intravenous  Once 11/28/16 2242 11/28/16 2243   11/28/16 2245  vancomycin (VANCOCIN) 2,000 mg in sodium chloride 0.9 % 500 mL IVPB     2,000 mg 250 mL/hr over 120 Minutes Intravenous  Once 11/28/16 2243 11/29/16 0126       Assessment/Plan Acute sigmoid diverticulitis with microperforation  - second known episode of diverticulitis, first episode treated outpatient with PO abx - clinically improving. Non-toxic appearing. - WBC 13.5 today, trending down; continue IV abx   - Pt will need 10 days of Augmentin at discharge. He would like to follow up outpatient with Dr. Greer Pickerel. - Would repeat CBC in AM to check WBC and if it continues to trend down then possible discharge tomorrow  PMH Colon CA - s/p Right colectomy  12/2015. no known recurrence, CEA pending  Liver lesion   FEN- tolerating soft  ID- Zosyn 5/17 >>  VTE - lovenox, SCD's   Plan: CBC in AM, IV abx, likely will be stable for discharge tomorrow.    LOS: 2 days    Kalman Drape , [facility] 12/01/2016, 10:55 AM Pager: [phone] Consults: [phone] Mon-Fri 7:00 am-4:30 pm Sat-Sun 7:00 am-11:30 am

## 2016-12-01 NOTE — Progress Notes (Signed)
PROGRESS NOTE                                                                                                                                                                                                             Patient Demographics:    Troy Lambert, is a 46 y.o. male, DOB - Aug 28, 1970, SJG:[id number]  Admit date - 11/28/2016   Admitting Physician Etta Quill, DO  Outpatient Primary MD for the patient is Everardo Beals, NP  LOS - 2  Chief Complaint  Patient presents with  . Fever  . Abdominal Pain  . Dysuria       Brief Narrative   46 y.o. 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  Patient presents to the ED with c/o abd pain.  Symptoms onset yesterday, lower abdomen.  Fever 102.7.    Subjective:   Patient in bed, appears comfortable, denies any headache, no fever, no chest pain or pressure, no shortness of breath , no abdominal pain. No focal weakness.   Assessment  & Plan :     1.Sepsis due to acute diverticulitis with microperforation. Sepsis physiology has completely resolved, he appears nontoxic, Gen. surgery on board, diet being gradually increased, continue IV antibiotics. Monitor cultures. Exam benign this morning. Discussed with general surgery likely discharged tomorrow on 10 days of oral Augmentin.  2. History of colon cancer. Status post hemicolectomy, patient follows with Dr. Ardis Hughs GI, Dr. Greer Pickerel surgery and Dr. Blair Promise oncology, per patient cancer was localized and all of it was removed during surgery. He had no chemotherapy or radiation treatments. CEA levels stable at 3 .  3. Nonspecific liver hypodensity. Likely fat deposition, outpatient follow-up with PCP and oncologist post discharge.    Diet : DIET SOFT Room service appropriate? Yes; Fluid consistency: Thin    Family Communication  :   daughter  Code Status :  Full  Disposition Plan  :  Home in am  Consults  :  CCS  Procedures  :    CT abdomen pelvis-acute diverticulitis with microperforation, vague liver hypodensity likely fat deposition.  DVT Prophylaxis  :  Lovenox    Lab Results  Component Value Date   PLT 321 12/01/2016    Inpatient Medications  Scheduled Meds: . enoxaparin (LOVENOX) injection  40 mg Subcutaneous Daily  . multivitamin with minerals  1 tablet Oral Daily   Continuous Infusions: . piperacillin-tazobactam (ZOSYN)  IV Stopped (12/01/16 1026)   PRN Meds:.acetaminophen **OR** [DISCONTINUED] acetaminophen, diphenhydrAMINE, fluticasone, HYDROcodone-acetaminophen, loratadine, [DISCONTINUED] ondansetron **OR** ondansetron (ZOFRAN) IV, zolpidem  Antibiotics  :    Anti-infectives    Start     Dose/Rate Route Frequency Ordered Stop   11/29/16 0600  piperacillin-tazobactam (ZOSYN) IVPB 3.375 g     3.375 g 12.5 mL/hr over 240 Minutes Intravenous Every 8 hours 11/29/16 0049     11/28/16 2245  piperacillin-tazobactam (ZOSYN) IVPB 3.375 g     3.375 g 100 mL/hr over 30 Minutes Intravenous  Once 11/28/16 2242 11/28/16 2342   11/28/16 2245  vancomycin (VANCOCIN) IVPB 1000 mg/200 mL premix  Status:  Discontinued     1,000 mg 200 mL/hr over 60 Minutes Intravenous  Once 11/28/16 2242 11/28/16 2243   11/28/16 2245  vancomycin (VANCOCIN) 2,000 mg in sodium chloride 0.9 % 500 mL IVPB     2,000 mg 250 mL/hr over 120 Minutes Intravenous  Once 11/28/16 2243 11/29/16 0126         Objective:   Vitals:   11/30/16 1702 11/30/16 2054 12/01/16 0429 12/01/16 1000  BP: 134/78 (!) 145/81 127/73 130/76  Pulse: 77 74 77 78  Resp: 16 16 16 17   Temp: 98.4 F (36.9 C) 99.1 F (37.3 C) 98.8 F (37.1 C) 98.7 F (37.1 C)  TempSrc: Oral Oral Oral Oral  SpO2: 97% 95% 93% 97%  Weight:  101.2 kg (223 lb 1.6 oz)    Height:  6\' 3"  (1.905 m)      Wt Readings from Last 3 Encounters:  11/30/16 101.2 kg (223 lb 1.6  oz)  11/16/16 103 kg (227 lb)  07/12/16 107.5 kg (236 lb 14.4 oz)     Intake/Output Summary (Last 24 hours) at 12/01/16 1130 Last data filed at 12/01/16 1000  Gross per 24 hour  Intake          2810.83 ml  Output              500 ml  Net          2310.83 ml     Physical Exam  Awake Alert, Oriented X 3, No new F.N deficits, Normal affect [place].AT,PERRAL Supple Neck,No JVD, No cervical lymphadenopathy appriciated.  Symmetrical Chest wall movement, Good air movement bilaterally, CTAB RRR,No Gallops,Rubs or new Murmurs, No Parasternal Heave +ve B.Sounds, Abd Soft, No tenderness, No organomegaly appriciated, No rebound - guarding or rigidity. No Cyanosis, Clubbing or edema, No new Rash or bruise    Data Review:    CBC  Recent Labs Lab 11/28/16 1953 11/29/16 0115 11/30/16 0622 12/01/16 0452  WBC 18.0* 19.6* 15.5* 13.5*  HGB 16.2 15.1 13.6 13.3  HCT 47.9 45.3 40.4 40.1  PLT 321 317 293 321  MCV 90.0 90.2 90.2 89.7  MCH 30.5 30.1 30.4 29.8  MCHC 33.8 33.3 33.7 33.2  RDW 16.7* 16.6* 17.0* 16.8*  LYMPHSABS 1.3  --   --   --   MONOABS 1.2*  --   --   --   EOSABS 0.1  --   --   --   BASOSABS 0.0  --   --   --     Chemistries   Recent Labs Lab 11/28/16 1953 11/29/16 0115 11/30/16 0622 12/01/16 0452  NA 136 136 137 136  K 4.1 4.5 3.5 3.8  CL 102 105 106  104  CO2 23 23 23 24   GLUCOSE 96 114* 96 93  BUN 11 9 <5* <5*  CREATININE 1.21 1.18 1.02 1.11  CALCIUM 9.7 8.6* 8.2* 8.4*  AST 47*  --   --   --   ALT 40  --   --   --   ALKPHOS 59  --   --   --   BILITOT 0.9  --   --   --    ------------------------------------------------------------------------------------------------------------------ No results for input(s): CHOL, HDL, LDLCALC, TRIG, CHOLHDL, LDLDIRECT in the last 72 hours.  No results found for: HGBA1C ------------------------------------------------------------------------------------------------------------------ No results for input(s): TSH,  T4TOTAL, T3FREE, THYROIDAB in the last 72 hours.  Invalid input(s): FREET3 ------------------------------------------------------------------------------------------------------------------ No results for input(s): VITAMINB12, FOLATE, FERRITIN, TIBC, IRON, RETICCTPCT in the last 72 hours.  Coagulation profile No results for input(s): INR, PROTIME in the last 168 hours.  No results for input(s): DDIMER in the last 72 hours.  Cardiac Enzymes No results for input(s): CKMB, TROPONINI, MYOGLOBIN in the last 168 hours.  Invalid input(s): CK ------------------------------------------------------------------------------------------------------------------ No results found for: BNP  Micro Results Recent Results (from the past 240 hour(s))  Blood Culture (routine x 2)     Status: None (Preliminary result)   Collection Time: 11/28/16  7:45 PM  Result Value Ref Range Status   Specimen Description BLOOD RIGHT ARM  Final   Special Requests   Final    BOTTLES DRAWN AEROBIC AND ANAEROBIC Blood Culture adequate volume   Culture NO GROWTH 1 DAY  Final   Report Status PENDING  Incomplete  Blood Culture (routine x 2)     Status: None (Preliminary result)   Collection Time: 11/29/16 12:00 AM  Result Value Ref Range Status   Specimen Description BLOOD RIGHT HAND  Final   Special Requests IN PEDIATRIC BOTTLE Blood Culture adequate volume  Final   Culture NO GROWTH 1 DAY  Final   Report Status PENDING  Incomplete    Radiology Reports Ct Abdomen Pelvis W Contrast  Result Date: 11/29/2016 CLINICAL DATA:  Initial evaluation for acute periumbilical pain. EXAM: CT ABDOMEN AND PELVIS WITH CONTRAST TECHNIQUE: Multidetector CT imaging of the abdomen and pelvis was performed using the standard protocol following bolus administration of intravenous contrast. CONTRAST:  163mL ISOVUE-300 IOPAMIDOL (ISOVUE-300) INJECTION 61% COMPARISON:  None available. FINDINGS: Lower chest: Minimal atelectatic changes present  within the visualized lung bases. Visualized lungs are otherwise clear. Hepatobiliary: Focal 1.8 cm hypodensity within the liver adjacent to the falciform ligament suspected to reflect focal fat deposition, not well seen on delayed sequence. Adjacent parenchymal calcification is similar to previous. Liver otherwise unremarkable. Gallbladder surgically absent. No biliary dilatation. Pancreas: Pancreas within normal limits. 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 Adrenals/Urinary Tract: Adrenal glands are normal. Kidneys equal in size with symmetric enhancement. No nephrolithiasis, hydronephrosis or focal enhancing renal mass. No hydroureter. Partially distended bladder within normal limits. Stomach/Bowel: Stomach within normal limits. No evidence for bowel obstruction. Patient is status post partial colectomy. Appendix not visualized. Prominent inflammatory stranding about multiple sigmoid diverticula, consistent with acute diverticulitis. Small focus of extraluminal gas within this region consistent with associated micro perforation (series 3, image 70). No discrete abscess or other complication. Vascular/Lymphatic: Mild atheromatous plaque within the intra-abdominal aorta. Normal intravascular enhancement seen throughout the intra-abdominal aorta and its branch vessels. No pathologically enlarged intra-abdominal or pelvic lymph nodes. Reproductive: Prostate normal. Other: Small volume free fluid noted within the pelvis, likely reactive. Musculoskeletal: No acute osseus abnormality. No worrisome lytic or blastic  osseous lesions. IMPRESSION: 1. Findings consistent with acute sigmoid diverticulitis. Few small foci of extraluminal gas within this region consistent with associated perforation. No abscess. 2. No other acute intra-abdominopelvic process. 3. Vague 1.8 cm hypodensity within the liver adjacent to the falciform ligament, most likely focal fat deposition. Attention at follow-up recommended. 4. Status post partial colectomy,  splenectomy, and cholecystectomy. Critical Value/emergent results were called by telephone at the time of interpretation on 11/29/2016 at 12:10 am to Dr. Montine Circle , who verbally acknowledged these results. Electronically Signed   By: Jeannine Boga M.D.   On: 11/28/2016 23:59    Time Spent in minutes  30   Lala Lund M.D on 12/01/2016 at 11:30 AM  Between 7am to 7pm - Pager - [phone] ( page via [email or web address], text pages only, please mention full 10 digit call back number). After 7pm go to [email or web address] - password [email or web address]

## 2016-12-02 LAB — BASIC METABOLIC PANEL
ANION GAP: 8 (ref 5–15)
BUN: 8 mg/dL (ref 6–20)
CO2: 24 mmol/L (ref 22–32)
Calcium: 8.7 mg/dL — ABNORMAL LOW (ref 8.9–10.3)
Chloride: 107 mmol/L (ref 101–111)
Creatinine, Ser: 1.06 mg/dL (ref 0.61–1.24)
GLUCOSE: 93 mg/dL (ref 65–99)
POTASSIUM: 3.9 mmol/L (ref 3.5–5.1)
Sodium: 139 mmol/L (ref 135–145)

## 2016-12-02 LAB — CBC
HEMATOCRIT: 41.1 % (ref 39.0–52.0)
Hemoglobin: 13.8 g/dL (ref 13.0–17.0)
MCH: 30 pg (ref 26.0–34.0)
MCHC: 33.6 g/dL (ref 30.0–36.0)
MCV: 89.3 fL (ref 78.0–100.0)
Platelets: 366 10*3/uL (ref 150–400)
RBC: 4.6 MIL/uL (ref 4.22–5.81)
RDW: 16.7 % — ABNORMAL HIGH (ref 11.5–15.5)
WBC: 9.1 10*3/uL (ref 4.0–10.5)

## 2016-12-02 MED ORDER — HYDROCODONE-ACETAMINOPHEN 5-325 MG PO TABS: 1.0000 | ORAL_TABLET | Freq: Four times a day (QID) | ORAL | 0 refills | Status: DC | PRN

## 2016-12-02 MED ORDER — AMOXICILLIN-POT CLAVULANATE 875-125 MG PO TABS: 1.0000 | ORAL_TABLET | Freq: Two times a day (BID) | ORAL | 0 refills | Status: DC

## 2016-12-02 MED ORDER — AMOXICILLIN-POT CLAVULANATE 875-125 MG PO TABS: 1.0000 | ORAL_TABLET | Freq: Two times a day (BID) | ORAL | Status: DC

## 2016-12-02 MED ORDER — DOCUSATE SODIUM 100 MG PO CAPS: 200.0000 mg | ORAL_CAPSULE | Freq: Two times a day (BID) | ORAL | 0 refills | Status: DC

## 2016-12-02 MED ORDER — PROBIOTIC 250 MG PO CAPS: 1.0000 | ORAL_CAPSULE | Freq: Two times a day (BID) | ORAL | 0 refills | Status: DC

## 2016-12-02 NOTE — Final Consult Note (Signed)
Consultant Final Sign-Off Note    Assessment/Final recommendations  Troy Lambert. is a 46 y.o. male followed by me for diverticulitis with microperforation;  2nd episode of diverticulitis H/o colon cancer 1 year ago No fever, tachycardia, wbc normalized today. [place] for [employer].   Wound care (if applicable):    Diet at discharge: low fiber; probiotic supplement   Activity at discharge: no restrictions  Follow-up appointment:  Dr Greer Pickerel - 2 -3 weeks   Pending results:  Unresulted Labs    None       Medication recommendations: augmentin 10 days + OTC probiotic   Other recommendations: needs to f/u with GI for colonoscopy    Thank you for allowing [place] to participate in the care of your patient!  Please consult [place] again if you have further needs for your patient.  Greer Pickerel M 12/02/2016 7:52 AM    Subjective   No pain. No n/v. Tolerated diet. I also saw pt yesterday around 11:30 for social visit.   Objective  Vital signs in last 24 hours: Temp:  [98.4 F (36.9 C)-98.7 F (37.1 C)] 98.4 F (36.9 C) (05/20 0542) Pulse Rate:  [71-78] 72 (05/20 0542) Resp:  [17-18] 18 (05/20 0542) BP: (127-135)/(73-81) 127/73 (05/20 0542) SpO2:  [97 %-98 %] 97 % (05/20 0542) Weight:  [102.1 kg (225 lb 1.6 oz)] 102.1 kg (225 lb 1.6 oz) (05/19 2127)  General: Alert, nontoxic, not ill appearing Soft, nt, nd   Pertinent labs and Studies:  Recent Labs  11/30/16 0622 12/01/16 0452 12/02/16 0427  WBC 15.5* 13.5* 9.1  HGB 13.6 13.3 13.8  HCT 40.4 40.1 41.1   BMET  Recent Labs  12/01/16 0452 12/02/16 0427  NA 136 139  K 3.8 3.9  CL 104 107  CO2 24 24  GLUCOSE 93 93  BUN <5* 8  CREATININE 1.11 1.06  CALCIUM 8.4* 8.7*   No results for input(s): LABURIN in the last 72 hours. Results for orders placed or performed during the hospital encounter of 11/28/16  Blood Culture (routine x 2)     Status: None (Preliminary result)   Collection Time: 11/28/16  7:45 PM  Result  Value Ref Range Status   Specimen Description BLOOD RIGHT ARM  Final   Special Requests   Final    BOTTLES DRAWN AEROBIC AND ANAEROBIC Blood Culture adequate volume   Culture NO GROWTH 2 DAYS  Final   Report Status PENDING  Incomplete  Blood Culture (routine x 2)     Status: None (Preliminary result)   Collection Time: 11/29/16 12:00 AM  Result Value Ref Range Status   Specimen Description BLOOD RIGHT HAND  Final   Special Requests IN PEDIATRIC BOTTLE Blood Culture adequate volume  Final   Culture NO GROWTH 2 DAYS  Final   Report Status PENDING  Incomplete    Imaging: No results found.

## 2016-12-02 NOTE — Discharge Summary (Signed)
Troy Lambert. MHD:[id number] DOB: Nov 29, 1970 DOA: 11/28/2016  PCP: Everardo Beals, NP  Admit date: 11/28/2016  Discharge date: 12/02/2016  Admitted From: Home  Disposition:  Home   Recommendations for Outpatient Follow-up:   Follow up with PCP in 1-2 weeks  PCP Please obtain BMP/CBC, 2 view CXR in 1week,  (see Discharge instructions)   PCP Please follow up on the following pending results: None   Home Health: None   Equipment/Devices: None  Consultations: CCS Discharge Condition: Stable   CODE STATUS: Full   Diet Recommendation: DIET SOFT     Chief Complaint  Patient presents with  . Fever  . Abdominal Pain  . 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 Patient presents to the ED with c/o abd pain. Symptoms onset yesterday, lower abdomen. Fever 102.7.                                                                  Hospital Course   1. Sepsis due to acute diverticulitis with microperforation. Sepsis physiology has completely resolved, he appears nontoxic, Gen. surgery was on board, now on soft diet and symptom free, WBC back to NML. Monitor cultures. Exam remains benign . Discussed with general surgery he is OK for DC on soft diet with on 10 days of oral Augmentin and CCS followup.   2. History of colon cancer. Status post hemicolectomy, patient follows with Dr. Ardis Hughs GI, Dr. Greer Pickerel surgery and Dr. Blair Promise oncology, per patient cancer was localized and all of it was removed during surgery. He had no chemotherapy or radiation treatments. CEA levels stable at 3 .   3. Nonspecific liver hypodensity.  Likely fat deposition, outpatient follow-up with PCP and oncologist post discharge. Case DW DR Benay Spice.     Discharge diagnosis     Principal Problem:   Diverticulitis of colon with perforation Active Problems:   Cancer of ascending colon ([place])   Lesion of liver    Discharge instructions    Discharge Instructions    Discharge instructions    Complete by:  As directed    Follow with Primary MD Everardo Beals, NP in 7 days   Get CBC, CMP, 2 view Chest X ray checked  by Primary MD or SNF MD in 5-7 days ( we routinely change or add medications that can affect your baseline labs and fluid status, therefore we recommend that you get the mentioned basic workup next visit with your PCP, your PCP may decide not to get them or add new tests based on their clinical decision)  Activity: As tolerated with Full fall precautions  use walker/cane & assistance as needed  Disposition Home    Diet:   DIET SOFT gradually advance to regular consistency over the next 1 week.  For Heart failure patients - Check your Weight same time everyday, if you gain over 2 pounds, or you develop in leg swelling, experience more shortness of breath or chest pain, call your Primary MD immediately. Follow Cardiac Low Salt Diet and 1.5 lit/day fluid restriction.  On your next visit with your primary care physician please Get Medicines reviewed and adjusted.  Please request your Prim.MD to go over all Hospital Tests and Procedure/Radiological results at the follow up, please get all Hospital records sent to your Prim MD by signing hospital release before you go home.  If you experience worsening of your admission symptoms, develop shortness of breath, life threatening emergency, suicidal or homicidal thoughts you must seek medical attention immediately by calling 911 or calling your MD immediately  if symptoms less severe.  You Must read complete instructions/literature along with all the possible adverse  reactions/side effects for all the Medicines you take and that have been prescribed to you. Take any new Medicines after you have completely understood and accpet all the possible adverse reactions/side effects.   Do not drive, operate heavy machinery, perform activities at heights, swimming or participation in water activities or provide baby sitting services if your were admitted for syncope or siezures until you have seen by Primary MD or a Neurologist and advised to do so again.  Do not drive when taking Pain medications.    Do not take more than prescribed Pain, Sleep and Anxiety Medications  Special Instructions: If you have smoked or chewed Tobacco  in the last 2 yrs please stop smoking, stop any regular Alcohol  and or any Recreational drug use.  Wear Seat belts while driving.   Please note  You were cared for by a hospitalist during your hospital stay. If you have any questions about your discharge medications or the care you received while you were in the hospital after you are discharged, you can call the unit and asked to speak with the hospitalist on call if the hospitalist that took care of you is not available. Once you are discharged, your primary care physician will handle any further medical issues. Please note that NO REFILLS for any discharge medications will be authorized once you are discharged, as it is imperative that you return to your primary care physician (or establish a relationship with a primary care physician if you do not have one) for your aftercare needs so that they can reassess your need for medications and monitor your lab values.   Increase activity slowly    Complete by:  As directed       Discharge Medications   Allergies as of 12/02/2016      Reactions   Dilaudid [hydromorphone Hcl] Itching      Medication List    TAKE these medications   acetaminophen 325 MG tablet Commonly known as:  TYLENOL Take 650 mg by mouth every 6 (six) hours as  needed for mild pain or fever.   amoxicillin-clavulanate 875-125 MG tablet Commonly known as:  AUGMENTIN Take 1 tablet by mouth every 12 (twelve) hours.   docusate sodium 100 MG capsule Commonly known as:  COLACE Take 2 capsules (200 mg total) by mouth 2 (two) times daily.   fluticasone 50 MCG/ACT nasal spray Commonly known as:  FLONASE Place 1 spray into both nostrils daily as needed  for allergies.   HYDROcodone-acetaminophen 5-325 MG tablet Commonly known as:  NORCO/VICODIN Take 1 tablet by mouth every 6 (six) hours as needed for severe pain.   loratadine 10 MG tablet Commonly known as:  CLARITIN Take 10 mg by mouth daily as needed for allergies.   multivitamin tablet Take 1 tablet by mouth daily.   ondansetron 4 MG tablet Commonly known as:  ZOFRAN Take 1 tablet (4 mg total) by mouth every 8 (eight) hours as needed for nausea or vomiting.   Probiotic 250 MG Caps Take 1 capsule by mouth 2 (two) times daily. Can substitute to any BID probiotic   testosterone cypionate 200 MG/ML injection Commonly known as:  DEPOTESTOSTERONE CYPIONATE Inject 100 mg into the muscle every 14 (fourteen) days.   zolpidem 10 MG tablet Commonly known as:  AMBIEN Take 5 mg by mouth at bedtime as needed for sleep.       Follow-up Information    Greer Pickerel, MD. Schedule an appointment as soon as possible for a visit in 3 week(s).   Specialty:  General Surgery Why:  diverticulitis Contact information: [place] [address] [phone]        Everardo Beals, NP. Schedule an appointment as soon as possible for a visit in 1 week(s).   Contact information: [facility] [place] [address] [phone]        Ladell Pier, MD. Schedule an appointment as soon as possible for a visit in 1 week(s).   Specialty:  Oncology Contact information: [place] [address] [phone]         Milus Banister, MD. Schedule an appointment as soon as possible for a visit in 1 week(s).   Specialty:  Gastroenterology Contact information: [address] [place] [address] [phone]           Major procedures and Radiology Reports - PLEASE review detailed and final reports thoroughly  -         Ct Abdomen Pelvis W Contrast  Result Date: 11/29/2016 CLINICAL DATA:  Initial evaluation for acute periumbilical pain. EXAM: CT ABDOMEN AND PELVIS WITH CONTRAST TECHNIQUE: Multidetector CT imaging of the abdomen and pelvis was performed using the standard protocol following bolus administration of intravenous contrast. CONTRAST:  142mL ISOVUE-300 IOPAMIDOL (ISOVUE-300) INJECTION 61% COMPARISON:  None available. FINDINGS: Lower chest: Minimal atelectatic changes present within the visualized lung bases. Visualized lungs are otherwise clear. Hepatobiliary: Focal 1.8 cm hypodensity within the liver adjacent to the falciform ligament suspected to reflect focal fat deposition, not well seen on delayed sequence. Adjacent parenchymal calcification is similar to previous. Liver otherwise unremarkable. Gallbladder surgically absent. No biliary dilatation. Pancreas: Pancreas within normal limits. 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 Adrenals/Urinary Tract: Adrenal glands are normal. Kidneys equal in size with symmetric enhancement. No nephrolithiasis, hydronephrosis or focal enhancing renal mass. No hydroureter. Partially distended bladder within normal limits. Stomach/Bowel: Stomach within normal limits. No evidence for bowel obstruction. Patient is status post partial colectomy. Appendix not visualized. Prominent inflammatory stranding about multiple sigmoid diverticula, consistent with acute diverticulitis. Small focus of extraluminal gas within this region consistent with associated micro perforation (series 3, image 70). No discrete abscess or other complication. Vascular/Lymphatic: Mild atheromatous plaque  within the intra-abdominal aorta. Normal intravascular enhancement seen throughout the intra-abdominal aorta and its branch vessels. No pathologically enlarged intra-abdominal or pelvic lymph nodes. Reproductive: Prostate normal. Other: Small volume free fluid noted within the pelvis,  likely reactive. Musculoskeletal: No acute osseus abnormality. No worrisome lytic or blastic osseous lesions. IMPRESSION: 1. Findings consistent with acute sigmoid diverticulitis. Few small foci of extraluminal gas within this region consistent with associated perforation. No abscess. 2. No other acute intra-abdominopelvic process. 3. Vague 1.8 cm hypodensity within the liver adjacent to the falciform ligament, most likely focal fat deposition. Attention at follow-up recommended. 4. Status post partial colectomy, splenectomy, and cholecystectomy. Critical Value/emergent results were called by telephone at the time of interpretation on 11/29/2016 at 12:10 am to Dr. Montine Circle , who verbally acknowledged these results. Electronically Signed   By: Jeannine Boga M.D.   On: 11/28/2016 23:59    Micro Results    Recent Results (from the past 240 hour(s))  Blood Culture (routine x 2)     Status: None (Preliminary result)   Collection Time: 11/28/16  7:45 PM  Result Value Ref Range Status   Specimen Description BLOOD RIGHT ARM  Final   Special Requests   Final    BOTTLES DRAWN AEROBIC AND ANAEROBIC Blood Culture adequate volume   Culture NO GROWTH 2 DAYS  Final   Report Status PENDING  Incomplete  Blood Culture (routine x 2)     Status: None (Preliminary result)   Collection Time: 11/29/16 12:00 AM  Result Value Ref Range Status   Specimen Description BLOOD RIGHT HAND  Final   Special Requests IN PEDIATRIC BOTTLE Blood Culture adequate volume  Final   Culture NO GROWTH 2 DAYS  Final   Report Status PENDING  Incomplete    Today   Subjective   Eliya Geiman today has no headache,no chest abdominal pain,no new  weakness tingling or numbness, feels much better wants to go home today.     Objective   Blood pressure 127/73, pulse 72, temperature 98.4 F (36.9 C), temperature source Oral, resp. rate 18, height 6\' 3"  (1.905 m), weight 102.1 kg (225 lb 1.6 oz), SpO2 97 %.   Intake/Output Summary (Last 24 hours) at 12/02/16 0816 Last data filed at 12/02/16 0634  Gross per 24 hour  Intake             1540 ml  Output             1250 ml  Net              290 ml    Exam Awake Alert, Oriented x 3, No new F.N deficits, Normal affect [place].AT,PERRAL Supple Neck,No JVD, No cervical lymphadenopathy appriciated.  Symmetrical Chest wall movement, Good air movement bilaterally, CTAB RRR,No Gallops,Rubs or new Murmurs, No Parasternal Heave +ve B.Sounds, Abd Soft, Non tender, No organomegaly appriciated, No rebound -guarding or rigidity. No Cyanosis, Clubbing or edema, No new Rash or bruise   Data Review   CBC w Diff:  Lab Results  Component Value Date   WBC 9.1 12/02/2016   HGB 13.8 12/02/2016   HGB 15.5 07/12/2016   HCT 41.1 12/02/2016   HCT 45.9 07/12/2016   PLT 366 12/02/2016   PLT 398 07/12/2016   LYMPHOPCT 7 11/28/2016   LYMPHOPCT 38.7 07/12/2016   MONOPCT 7 11/28/2016   MONOPCT 12.0 07/12/2016   EOSPCT 0 11/28/2016   EOSPCT 3.4 07/12/2016   BASOPCT 0 11/28/2016   BASOPCT 0.9 07/12/2016    CMP:  Lab Results  Component Value Date   NA 139 12/02/2016   K 3.9 12/02/2016   CL 107 12/02/2016   CO2 24 12/02/2016   BUN 8 12/02/2016  CREATININE 1.06 12/02/2016   PROT 7.5 11/28/2016   ALBUMIN 4.5 11/28/2016   BILITOT 0.9 11/28/2016   ALKPHOS 59 11/28/2016   AST 47 (H) 11/28/2016   ALT 40 11/28/2016  .   Total Time in preparing paper work, data evaluation and todays exam - 81 minutes  Lala Lund M.D on 12/02/2016 at [place]  [phone]

## 2016-12-02 NOTE — Progress Notes (Signed)
Pharmacy Antibiotic Note  Troy Lambert. is a 46 y.o. male admitted on 11/28/2016 with acute diverticulitis with microperforation.  Pharmacy has been consulted to change from Zosyn to Augmentin.  Plan: Augmentin 875mg  PO BID.  Height: 6\' 3"  (190.5 cm) Weight: 225 lb 1.6 oz (102.1 kg) IBW/kg (Calculated) : 84.5  Temp (24hrs), Avg:98.5 F (36.9 C), Min:98.4 F (36.9 C), Max:98.7 F (37.1 C)   Recent Labs Lab 11/28/16 1953 11/28/16 2014 11/28/16 2309 11/29/16 0115 11/30/16 0622 12/01/16 0452 12/02/16 0427  WBC 18.0*  --   --  19.6* 15.5* 13.5* 9.1  CREATININE 1.21  --   --  1.18 1.02 1.11 1.06  LATICACIDVEN  --  1.57 1.08  --   --   --   --     Estimated Creatinine Clearance: 111.5 mL/min (by C-G formula based on SCr of 1.06 mg/dL).    Allergies  Allergen Reactions  . Dilaudid [Hydromorphone Hcl] Itching    Antimicrobials this admission: Vanc 5/16 Vanc x 1  Zosyn 5/16 >> 5/20 Augmentin 5/20 >>   Microbiology results: 5/17 BCx x2: NGTD  Thank you for allowing pharmacy to be a part of this patient's care.  Wynona Neat, PharmD, BCPS  12/02/2016 7:38 AM

## 2016-12-02 NOTE — Progress Notes (Signed)
Troy Lambert. to be D/C'd Home per MD order.  Discussed prescriptions and follow up appointments with the patient. Prescriptions given to patient, medication list explained in detail. Pt verbalized understanding.  Allergies as of 12/02/2016      Reactions   Dilaudid [hydromorphone Hcl] Itching      Medication List    TAKE these medications   acetaminophen 325 MG tablet Commonly known as:  TYLENOL Take 650 mg by mouth every 6 (six) hours as needed for mild pain or fever.   amoxicillin-clavulanate 875-125 MG tablet Commonly known as:  AUGMENTIN Take 1 tablet by mouth every 12 (twelve) hours.   docusate sodium 100 MG capsule Commonly known as:  COLACE Take 2 capsules (200 mg total) by mouth 2 (two) times daily.   fluticasone 50 MCG/ACT nasal spray Commonly known as:  FLONASE Place 1 spray into both nostrils daily as needed for allergies.   HYDROcodone-acetaminophen 5-325 MG tablet Commonly known as:  NORCO/VICODIN Take 1 tablet by mouth every 6 (six) hours as needed for severe pain.   loratadine 10 MG tablet Commonly known as:  CLARITIN Take 10 mg by mouth daily as needed for allergies.   multivitamin tablet Take 1 tablet by mouth daily.   ondansetron 4 MG tablet Commonly known as:  ZOFRAN Take 1 tablet (4 mg total) by mouth every 8 (eight) hours as needed for nausea or vomiting.   Probiotic 250 MG Caps Take 1 capsule by mouth 2 (two) times daily. Can substitute to any BID probiotic   testosterone cypionate 200 MG/ML injection Commonly known as:  DEPOTESTOSTERONE CYPIONATE Inject 100 mg into the muscle every 14 (fourteen) days.   zolpidem 10 MG tablet Commonly known as:  AMBIEN Take 5 mg by mouth at bedtime as needed for sleep.       Vitals:   12/01/16 2127 12/02/16 0542  BP: 135/81 127/73  Pulse: 71 72  Resp: 18 18  Temp: 98.5 F (36.9 C) 98.4 F (36.9 C)    Skin clean, dry and intact without evidence of skin break down, no evidence of skin tears  noted. IV catheter discontinued intact. Site without signs and symptoms of complications. Dressing and pressure applied. Pt denies pain at this time. No complaints noted.  An After Visit Summary was printed and given to the patient. Patient escorted via [place], and D/C home via private auto.  Troy Lambert BSN, RN [facility] Phone [phone]

## 2016-12-02 NOTE — Discharge Instructions (Signed)
Follow with Primary MD Troy Beals, NP in 7 days   Get CBC, CMP, 2 view Chest X ray checked  by Primary MD or SNF MD in 5-7 days ( we routinely change or add medications that can affect your baseline labs and fluid status, therefore we recommend that you get the mentioned basic workup next visit with your PCP, your PCP may decide not to get them or add new tests based on their clinical decision)  Activity: As tolerated with Full fall precautions use walker/cane & assistance as needed  Disposition Home    Diet:   DIET SOFT gradually advance to regular consistency over the next 1 week.  For Heart failure patients - Check your Weight same time everyday, if you gain over 2 pounds, or you develop in leg swelling, experience more shortness of breath or chest pain, call your Primary MD immediately. Follow Cardiac Low Salt Diet and 1.5 lit/day fluid restriction.  On your next visit with your primary care physician please Get Medicines reviewed and adjusted.  Please request your Prim.MD to go over all Hospital Tests and Procedure/Radiological results at the follow up, please get all Hospital records sent to your Prim MD by signing hospital release before you go home.  If you experience worsening of your admission symptoms, develop shortness of breath, life threatening emergency, suicidal or homicidal thoughts you must seek medical attention immediately by calling 911 or calling your MD immediately  if symptoms less severe.  You Must read complete instructions/literature along with all the possible adverse reactions/side effects for all the Medicines you take and that have been prescribed to you. Take any new Medicines after you have completely understood and accpet all the possible adverse reactions/side effects.   Do not drive, operate heavy machinery, perform activities at heights, swimming or participation in water activities or provide baby sitting services if your were admitted for syncope or  siezures until you have seen by Primary MD or a Neurologist and advised to do so again.  Do not drive when taking Pain medications.    Do not take more than prescribed Pain, Sleep and Anxiety Medications  Special Instructions: If you have smoked or chewed Tobacco  in the last 2 yrs please stop smoking, stop any regular Alcohol  and or any Recreational drug use.  Wear Seat belts while driving.   Please note  You were cared for by a hospitalist during your hospital stay. If you have any questions about your discharge medications or the care you received while you were in the hospital after you are discharged, you can call the unit and asked to speak with the hospitalist on call if the hospitalist that took care of you is not available. Once you are discharged, your primary care physician will handle any further medical issues. Please note that NO REFILLS for any discharge medications will be authorized once you are discharged, as it is imperative that you return to your primary care physician (or establish a relationship with a primary care physician if you do not have one) for your aftercare needs so that they can reassess your need for medications and monitor your lab values.

## 2016-12-04 LAB — CULTURE, BLOOD (ROUTINE X 2)
Culture: NO GROWTH
Culture: NO GROWTH
Special Requests: ADEQUATE
Special Requests: ADEQUATE

## 2016-12-05 ENCOUNTER — Encounter: Payer: BC Managed Care – PPO | Admitting: Gastroenterology

## 2016-12-13 ENCOUNTER — Encounter: Payer: Self-pay | Admitting: Oncology

## 2016-12-13 NOTE — Progress Notes (Unsigned)
His case was presented at the GI tumor conference on 12/05/2016. The CT images from 11/28/2016 were reviewed. There is no evidence of a liver lesion. X-ray follow-up is not recommended.

## 2016-12-27 ENCOUNTER — Ambulatory Visit (INDEPENDENT_AMBULATORY_CARE_PROVIDER_SITE_OTHER): Payer: BC Managed Care – PPO | Admitting: Physician Assistant

## 2016-12-27 ENCOUNTER — Encounter: Payer: Self-pay | Admitting: Physician Assistant

## 2016-12-27 VITALS — BP 130/80 | HR 76 | Ht 74.0 in | Wt 214.0 lb

## 2016-12-27 DIAGNOSIS — R1032 Left lower quadrant pain: Secondary | ICD-10-CM | POA: Diagnosis not present

## 2016-12-27 DIAGNOSIS — K5792 Diverticulitis of intestine, part unspecified, without perforation or abscess without bleeding: Secondary | ICD-10-CM | POA: Diagnosis not present

## 2016-12-27 NOTE — Patient Instructions (Signed)
Continue the Augmentin 875 mg, Take 1 tab by mouth twice daily.    You have been scheduled for a CT scan of the abdomen and pelvis at [facility] ([address]---this is in the same building as Press [facility]).   You are scheduled on 12-28-2016 at 1:30 PM. You should arrive at 1:15  to your appointment time for registration. Please follow the written instructions below on the day of your exam:  WARNING: IF YOU ARE ALLERGIC TO IODINE/X-RAY DYE, PLEASE NOTIFY RADIOLOGY IMMEDIATELY AT [phone]! YOU WILL BE GIVEN A 13 HOUR PREMEDICATION PREP.  1) Do not eat or drink anything after 9:30 am (4 hours prior to your test) 2) You have been given 2 bottles of oral contrast to drink. The solution may taste               better if refrigerated, but do NOT add ice or any other liquid to this solution. Shake             well before drinking.    Drink 1 bottle of contrast @ 11:30 am (2 hours prior to your exam)  Drink 1 bottle of contrast @ 12:30 PM (1 hour prior to your exam)  You may take any medications as prescribed with a small amount of water except for the following: Metformin, Glucophage, Glucovance, Avandamet, Riomet, Fortamet, Actoplus Met, Janumet, Glumetza or Metaglip. The above medications must be held the day of the exam AND 48 hours after the exam.  The purpose of you drinking the oral contrast is to aid in the visualization of your intestinal tract. The contrast solution may cause some diarrhea. Before your exam is started, you will be given a small amount of fluid to drink. Depending on your individual set of symptoms, you may also receive an intravenous injection of x-ray contrast/dye. Plan on being at [facility] for 30 minutes or long, depending on the type of exam you are having performed.  If you have any questions regarding your exam or if you need to reschedule, you may call the CT department at [phone] between the hours of 8:00 am and 5:00 pm,  Monday-Friday.  ________________________________________________________________________

## 2016-12-27 NOTE — Progress Notes (Signed)
Subjective:    Patient ID: Troy Lambert., male    DOB: [birth year], 46 y.o.   MRN: [id number]  HPI Troy Lambert is a very nice 46 year old white male, known to Dr. 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 He underwent right hemicolectomy, cholecystectomy and splenectomy per Dr. [name] on 12/31/2015. Cholecystectomy was done because of symptomatic cholelithiasis and he had a splenic mass found on CT scan which was benign. He was diagnosed with stage IIA  disease. He was to have one year interval follow-up colonoscopy. He comes in today for follow-up after recent hospitalization 516 through 12/02/2016 for acute sigmoid diverticulitis with microperforation. He was followed by surgery during this hospital stay and was not seen by GI.  He had CT of the abdomen and pelvis done on 11/28/2016 which showed acute sigmoid diverticulitis with a few small foci of extraluminal gas consistent with associated perforation, no abscess 80 had a vague 1.8 cm hypodensity within the liver adjacent to the falciform ligament most likely focal fat deposition attention at follow-up recommended. He was managed with IV antibiotics, symptomatically improved and was discharged home with a 10 day course of Augmentin. Patient says that he finished the Augmentin and within a day or 2 felt an increase in lower abdominal discomfort. He was called and an additional 7 days of Augmentin. During this time he did continue to have intermittent low-grade temps in the 99-100 range and at one point was up to 101. His wife said he had some chills and sweats for several days as well. His pain has never completely resolved. He continues to have pain exacerbated by bowel movements and says he is still sore in the suprapubic area. He is now on his third course of  Augmentin 875 twice a day, which was started by primary care couple of days ago. She says over the past few days he has not had any fever, but continues to feel as if "something is not right. Most recent labs done by his PCP 2 days ago showed WBC of 8.4 hemoglobin 16 hematocrit of 47.6 and platelets of 439.     Review of Systems Pertinent positive and negative review of systems were noted in the above HPI section.  All other review of systems was otherwise negative.  Outpatient Encounter Prescriptions as of 12/27/2016  Medication Sig  . amoxicillin-clavulanate (AUGMENTIN) 875-125 MG tablet Take 1 tablet by mouth every 12 (twelve) hours.  . docusate sodium (COLACE) 100 MG capsule Take 2 capsules (200 mg total) by mouth 2 (two) times daily.  . fluticasone (FLONASE) 50 MCG/ACT nasal spray Place 1 spray into both nostrils daily as needed for allergies.   [name] Kitchen HYDROcodone-acetaminophen (NORCO/VICODIN) 5-325 MG tablet Take 1 tablet by mouth every 6 (six) hours as needed for severe pain.  [name] Kitchen loratadine (CLARITIN) 10 MG tablet Take 10 mg by mouth daily as needed for allergies.   . Multiple Vitamin (MULTIVITAMIN) tablet Take 1 tablet by mouth daily.  . ondansetron (ZOFRAN) 4 MG tablet Take 1 tablet (4 mg total) by mouth every 8 (eight) hours as needed for nausea or vomiting.  . Probiotic Product (PROBIOTIC-10 PO) Take 1 tablet by mouth daily.  [name] Kitchen testosterone cypionate (DEPOTESTOSTERONE CYPIONATE) 200 MG/ML injection Inject 100 mg into the muscle every 14 (fourteen)  days.   . zolpidem (AMBIEN) 10 MG tablet Take 5 mg by mouth at bedtime as needed for sleep.   . [DISCONTINUED] acetaminophen (TYLENOL) 325 MG tablet Take 650 mg by mouth every 6 (six) hours as needed for mild pain or fever.  . [DISCONTINUED] Saccharomyces boulardii (PROBIOTIC) 250 MG CAPS Take 1 capsule by mouth 2 (two) times daily. Can substitute to any BID probiotic   No facility-administered encounter medications on file as of 12/27/2016.     Allergies  Allergen Reactions  . Dilaudid [Hydromorphone Hcl] Itching   Patient Active Problem List   Diagnosis Date Noted  . Diverticulitis of colon with perforation 11/29/2016  . Lesion of liver 11/29/2016  . Genetic testing 03/02/2016  . Cancer of ascending colon ([place]) 01/10/2016  . Right leg DVT ([place]) 01/10/2016  . Anemia of chronic disease 12/31/2015  . Symptomatic cholelithiasis 12/31/2015  . Splenic mass 12/31/2015   Social History   Social History  . Marital status: Married    Spouse name: Troy Lambert  . Number of children: 2  . Years of education: 12   Occupational History  . Not on file.   Social History Main Topics  . Smoking status: Never Smoker  . Smokeless tobacco: Current User    Types: Chew     Comment: pt used smokless tobacco  . Alcohol use 0.0 oz/week     Comment: [place]  . Drug use: No  . Sexual activity: Not on file   Other Topics Concern  . Not on file   Social History Narrative   Lives with wife   Caffeine use: Drinks coffee daily or monster energy drinks    Troy Lambert family history includes Brain cancer in his paternal uncle; Heart attack in his paternal grandfather; Kidney cancer in his maternal grandfather; Lung cancer in his maternal grandfather and maternal grandmother; Lung cancer (age of onset: 94) in his father; Prostate cancer in his paternal uncle.      Objective:    Vitals:   12/27/16 1329  BP: 130/80  Pulse: 76    Physical Exam  well-developed white male in no acute distress, accompanied by his wife both very pleasant blood pressure 130/80 pulse 76, height 6 foot 2, weight 214, BMI 27.4. HEENT; nontraumatic normocephalic EOMI PERRLAsclera anicteric, Cardiovascular;regular rate and rhythm with S1-S2 no murmur or gallop, Pulmonary ;clear bilaterally, Abdomen; soft, bowel sounds are present he is tender bilaterally in the very low or quadrants and in the suprapubic area there is no rebound, no palpable mass, Midline incisional  scar, Rectal; exam not done, Extremities ;no clubbing cyanosis or edema skin warm and dry, Neuropsych ;mood and affect appropriate       Assessment & Plan:   #59 46 year old white male here for follow-up after recent hospitalization 5/16 through 12/02/2016 for acute sigmoid diverticulitis with microperforation. Patient currently on his third course of Augmentin since discharge from the hospital for continued complaints of lower abdominal discomfort/pain and intermittent low-grade fevers of 99-100. He did have at least one tab 101. He has also had associated chills and sweats which have not been present over the past 3-4 days.  I am concerned he has either persistent acute diverticulitis and/or abscess formation  #2 Stage II a adenocarcinoma of the hepatic flexure status post right hemicolectomy, cholecystectomy and splenectomy June  2017-due for one year follow-up colonoscopy #3 Vague 1.8 cm hypodensity in the liver adjacent to the falciform ligament on recent CT-rule out focal fat deposition versus neoplasm  Plan;  Continue Augmentin 875 mg by mouth twice a day Schedule for CT of the abdomen and pelvis with IV and oral contrast within the next 24 hours. I discussed the possibility of abscess formation and/or persistent diverticulitis, and ramifications. Continue soft diet Patient will need MRI of the liver also, after an acute issues with diverticulitis have resolved. He will also need eventual colonoscopy this summer with Dr. Ardis Hughs which cannot be done until diverticulitis has completely resolved.   Kitzia Camus S Mashanda Ishibashi PA-C 12/27/2016   Cc: Everardo Beals, NP

## 2016-12-28 ENCOUNTER — Encounter (HOSPITAL_COMMUNITY): Payer: Self-pay | Admitting: *Deleted

## 2016-12-28 ENCOUNTER — Telehealth: Payer: Self-pay | Admitting: *Deleted

## 2016-12-28 ENCOUNTER — Ambulatory Visit (INDEPENDENT_AMBULATORY_CARE_PROVIDER_SITE_OTHER)
Admission: RE | Admit: 2016-12-28 | Discharge: 2016-12-28 | Disposition: A | Discharge status: Home / Self Care | Payer: BC Managed Care – PPO | Source: Ambulatory Visit | Attending: Physician Assistant | Admitting: Physician Assistant

## 2016-12-28 ENCOUNTER — Inpatient Hospital Stay (HOSPITAL_COMMUNITY)
Admission: AD | Admit: 2016-12-28 | Discharge: 2017-01-01 | DRG: 392 | Disposition: A | Discharge status: Home / Self Care | Payer: BC Managed Care – PPO | Source: Ambulatory Visit | Attending: Internal Medicine | Admitting: Internal Medicine

## 2016-12-28 ENCOUNTER — Telehealth: Payer: Self-pay | Admitting: Physician Assistant

## 2016-12-28 DIAGNOSIS — Z720 Tobacco use: Secondary | ICD-10-CM

## 2016-12-28 DIAGNOSIS — Z79899 Other long term (current) drug therapy: Secondary | ICD-10-CM

## 2016-12-28 DIAGNOSIS — Z9081 Acquired absence of spleen: Secondary | ICD-10-CM | POA: Diagnosis not present

## 2016-12-28 DIAGNOSIS — Z85038 Personal history of other malignant neoplasm of large intestine: Secondary | ICD-10-CM

## 2016-12-28 DIAGNOSIS — R109 Unspecified abdominal pain: Secondary | ICD-10-CM | POA: Diagnosis present

## 2016-12-28 DIAGNOSIS — K760 Fatty (change of) liver, not elsewhere classified: Secondary | ICD-10-CM | POA: Diagnosis present

## 2016-12-28 DIAGNOSIS — R1032 Left lower quadrant pain: Secondary | ICD-10-CM

## 2016-12-28 DIAGNOSIS — K5720 Diverticulitis of large intestine with perforation and abscess without bleeding: Principal | ICD-10-CM | POA: Diagnosis present

## 2016-12-28 DIAGNOSIS — Z86718 Personal history of other venous thrombosis and embolism: Secondary | ICD-10-CM

## 2016-12-28 DIAGNOSIS — K769 Liver disease, unspecified: Secondary | ICD-10-CM

## 2016-12-28 DIAGNOSIS — K5792 Diverticulitis of intestine, part unspecified, without perforation or abscess without bleeding: Secondary | ICD-10-CM

## 2016-12-28 DIAGNOSIS — D72829 Elevated white blood cell count, unspecified: Secondary | ICD-10-CM | POA: Diagnosis present

## 2016-12-28 DIAGNOSIS — Z85828 Personal history of other malignant neoplasm of skin: Secondary | ICD-10-CM

## 2016-12-28 DIAGNOSIS — C182 Malignant neoplasm of ascending colon: Secondary | ICD-10-CM | POA: Diagnosis not present

## 2016-12-28 DIAGNOSIS — Z9049 Acquired absence of other specified parts of digestive tract: Secondary | ICD-10-CM

## 2016-12-28 LAB — CBC WITH DIFFERENTIAL/PLATELET
Basophils Absolute: 0 10*3/uL (ref 0.0–0.1)
Basophils Relative: 0 %
Eosinophils Absolute: 0.3 10*3/uL (ref 0.0–0.7)
Eosinophils Relative: 3 %
HEMATOCRIT: 43.9 % (ref 39.0–52.0)
HEMOGLOBIN: 15 g/dL (ref 13.0–17.0)
LYMPHS ABS: 2.7 10*3/uL (ref 0.7–4.0)
Lymphocytes Relative: 22 %
MCH: 30.4 pg (ref 26.0–34.0)
MCHC: 34.2 g/dL (ref 30.0–36.0)
MCV: 88.9 fL (ref 78.0–100.0)
Monocytes Absolute: 1.5 10*3/uL — ABNORMAL HIGH (ref 0.1–1.0)
Monocytes Relative: 13 %
NEUTROS ABS: 7.6 10*3/uL (ref 1.7–7.7)
NEUTROS PCT: 62 %
Platelets: 384 10*3/uL (ref 150–400)
RBC: 4.94 MIL/uL (ref 4.22–5.81)
RDW: 15.3 % (ref 11.5–15.5)
WBC: 12.1 10*3/uL — AB (ref 4.0–10.5)

## 2016-12-28 LAB — COMPREHENSIVE METABOLIC PANEL
ALBUMIN: 3.9 g/dL (ref 3.5–5.0)
ALK PHOS: 64 U/L (ref 38–126)
ALT: 51 U/L (ref 17–63)
AST: 36 U/L (ref 15–41)
Anion gap: 10 (ref 5–15)
BUN: 9 mg/dL (ref 6–20)
CALCIUM: 9 mg/dL (ref 8.9–10.3)
CHLORIDE: 103 mmol/L (ref 101–111)
CO2: 25 mmol/L (ref 22–32)
Creatinine, Ser: 1.02 mg/dL (ref 0.61–1.24)
GFR calc Af Amer: >60 mL/min (ref >60)
GFR calc non Af Amer: >60 mL/min (ref >60)
GLUCOSE: 94 mg/dL (ref 65–99)
POTASSIUM: 4.2 mmol/L (ref 3.5–5.1)
SODIUM: 138 mmol/L (ref 135–145)
Total Bilirubin: 0.7 mg/dL (ref 0.3–1.2)
Total Protein: 7.5 g/dL (ref 6.5–8.1)

## 2016-12-28 LAB — PHOSPHORUS: Phosphorus: 2.9 mg/dL (ref 2.5–4.6)

## 2016-12-28 LAB — MAGNESIUM: Magnesium: 2 mg/dL (ref 1.7–2.4)

## 2016-12-28 MED ORDER — MORPHINE SULFATE (PF) 2 MG/ML IV SOLN
INTRAVENOUS | Status: AC
  Administered 2016-12-28: 1 mg via INTRAVENOUS
  Filled 2016-12-28: qty 1

## 2016-12-28 MED ORDER — IOPAMIDOL (ISOVUE-300) INJECTION 61%
100.0000 mL | Freq: Once | INTRAVENOUS | Status: AC | PRN
  Administered 2016-12-28: 100 mL via INTRAVENOUS

## 2016-12-28 MED ORDER — ZOLPIDEM TARTRATE 5 MG PO TABS
5.0000 mg | ORAL_TABLET | Freq: Every evening | ORAL | Status: DC | PRN
  Administered 2016-12-28 – 2016-12-31 (×4): 5 mg via ORAL
  Filled 2016-12-28 (×4): qty 1

## 2016-12-28 MED ORDER — DEXTROSE-NACL 5-0.45 % IV SOLN
INTRAVENOUS | Status: DC
  Administered 2016-12-28: 17:00:00 via INTRAVENOUS

## 2016-12-28 MED ORDER — ONDANSETRON HCL 4 MG PO TABS: 4.0000 mg | ORAL_TABLET | Freq: Four times a day (QID) | ORAL | Status: DC | PRN

## 2016-12-28 MED ORDER — METRONIDAZOLE IN NACL 5-0.79 MG/ML-% IV SOLN
500.0000 mg | Freq: Three times a day (TID) | INTRAVENOUS | Status: DC
  Administered 2016-12-28 – 2016-12-29 (×2): 500 mg via INTRAVENOUS
  Filled 2016-12-28 (×3): qty 100

## 2016-12-28 MED ORDER — CIPROFLOXACIN IN D5W 400 MG/200ML IV SOLN
400.0000 mg | Freq: Two times a day (BID) | INTRAVENOUS | Status: DC
  Administered 2016-12-28 – 2016-12-29 (×2): 400 mg via INTRAVENOUS
  Filled 2016-12-28 (×2): qty 200

## 2016-12-28 MED ORDER — ACETAMINOPHEN 650 MG RE SUPP: 650.0000 mg | Freq: Four times a day (QID) | RECTAL | Status: DC | PRN

## 2016-12-28 MED ORDER — ONDANSETRON HCL 4 MG/2ML IJ SOLN: 4.0000 mg | Freq: Four times a day (QID) | INTRAMUSCULAR | Status: DC | PRN

## 2016-12-28 MED ORDER — ENOXAPARIN SODIUM 40 MG/0.4ML ~~LOC~~ SOLN
40.0000 mg | SUBCUTANEOUS | Status: DC
  Administered 2016-12-28 – 2016-12-31 (×4): 40 mg via SUBCUTANEOUS
  Filled 2016-12-28 (×4): qty 0.4

## 2016-12-28 MED ORDER — ACETAMINOPHEN 325 MG PO TABS: 650.0000 mg | ORAL_TABLET | Freq: Four times a day (QID) | ORAL | Status: DC | PRN

## 2016-12-28 MED ORDER — MORPHINE SULFATE (PF) 2 MG/ML IV SOLN
1.0000 mg | INTRAVENOUS | Status: DC | PRN
  Administered 2016-12-28 – 2016-12-30 (×4): 2 mg via INTRAVENOUS
  Filled 2016-12-28 (×4): qty 1

## 2016-12-28 NOTE — Consult Note (Signed)
Chief Complaint:  Lower abdominal pain and CT demonstrating diverticulitis with phlegmon  History of Present Illness:  Troy Lambert. is an 46 y.o. male on whom Dr. [name] performed a lap right colectomy, splenectomy, and cholecystectomy last June for colon cancer and abnormal PET scan lighting up the spleen.  Spleen was benign.    He is admitted after a recent bout of abdominal pain after defecation and CT showing the phlegmon.    Past Medical History:  Diagnosis Date  . Allergy   . Anemia    iron def anemia  . Cancer ([place]) 12/2015   sqamous cell, basal cell skin cancers. Colon Cancer dx. surgery planned  . Colon cancer ([place])   . Diverticulosis    never a problem  . Right leg DVT ([place]) 01/10/2016    Past Surgical History:  Procedure Laterality Date  . CHOLECYSTECTOMY N/A 12/27/2015   Procedure: LAPAROSCOPIC CHOLECYSTECTOMY WITH INTRAOPERATIVE CHOLANGIOGRAM;  Surgeon: Greer Pickerel, MD;  Location: [facility];  Service: General;  Laterality: N/A;  . LAPAROSCOPIC PARTIAL COLECTOMY N/A 12/27/2015   Procedure: LAPAROSCOPIC PARTIAL RIGHT COLECTOMY;  Surgeon: Greer Pickerel, MD;  Location: [facility];  Service: General;  Laterality: N/A;  . 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   Procedure:  LAPAROSCOPIC SPLENECTOMY;  Surgeon: Greer Pickerel, MD;  Location: [facility];  Service: General;  Laterality: N/A;  . TONSILLECTOMY    . VASECTOMY    . WISDOM TOOTH EXTRACTION      Current Facility-Administered Medications  Medication Dose Route Frequency Provider Last Rate Last Dose  . acetaminophen (TYLENOL) tablet 650 mg  650 mg Oral Q6H PRN Patrecia Pour, Christean Grief, MD       Or  . acetaminophen (TYLENOL) suppository 650 mg  650 mg Rectal Q6H PRN Patrecia Pour, Christean Grief, MD      . ciprofloxacin (CIPRO) IVPB 400 mg  400 mg Intravenous Q12H Patrecia Pour, Christean Grief, MD      . dextrose 5 %-0.45 % sodium chloride infusion   Intravenous Continuous Patrecia Pour, Christean Grief, MD 75 mL/hr at 12/28/16 1727    . enoxaparin (LOVENOX)  injection 40 mg  40 mg Subcutaneous Q24H Patrecia Pour, Christean Grief, MD      . metroNIDAZOLE (FLAGYL) IVPB 500 mg  500 mg Intravenous Q8H Patrecia Pour, Christean Grief, MD      . morphine 2 MG/ML injection 1-2 mg  1-2 mg Intravenous Q2H PRN Patrecia Pour, Christean Grief, MD      . ondansetron [facility]) tablet 4 mg  4 mg Oral Q6H PRN Patrecia Pour, Christean Grief, MD       Or  . ondansetron [facility]) injection 4 mg  4 mg Intravenous Q6H PRN Patrecia Pour, Christean Grief, MD      . zolpidem [facility]) tablet 5 mg  5 mg Oral QHS PRN Patrecia Pour, Christean Grief, MD       Dilaudid [hydromorphone hcl] Family History  Problem Relation Age of Onset  . Lung cancer Father 76       mets to bone  . Prostate cancer Paternal Uncle   . Lung cancer Maternal Grandmother        smoker and worked in [employer]  . Lung cancer Maternal Grandfather   . Kidney cancer Maternal Grandfather   . Heart attack Paternal Grandfather   . Brain cancer Paternal Uncle        dx in his 40s  . Colon cancer Neg Hx   . Esophageal cancer Neg Hx   . Pancreatic cancer Neg Hx   . Rectal cancer  Neg Hx   . Stomach cancer Neg Hx    Social History:   reports that he has never smoked. His smokeless tobacco use includes Chew. He reports that he drinks alcohol. He reports that he does not use drugs.   REVIEW OF SYSTEMS : Negative except for see problem list  Physical Exam:   Blood pressure 134/86, pulse 79, temperature 98.1 F (36.7 C), temperature source Oral, resp. rate 16, height 6\' 2"  (1.88 m), weight 97.1 kg (214 lb), SpO2 96 %. Body mass index is 27.48 kg/m.  Gen:  WDWN WM NAD  Neurological: Alert and oriented to person, place, and time. Motor and sensory function is grossly intact  Head: Normocephalic and atraumatic.  Eyes: Conjunctivae are normal. Pupils are equal, round, and reactive to light. No scleral icterus.  Neck: Normal range of motion. Neck supple. No tracheal deviation or thyromegaly present.  Cardiovascular:  SR without murmurs or gallops.  No carotid  bruits Breast:  Not examined Respiratory: Effort normal.  No respiratory distress. No chest wall tenderness. Breath sounds normal.  No wheezes, rales or rhonchi.  Abdomen:  Tender in the LLQ and toward the midline GU:  Not examined Musculoskeletal: Normal range of motion. Extremities are nontender. No cyanosis, edema or clubbing noted Lymphadenopathy: No cervical, preauricular, postauricular or axillary adenopathy is present Skin: Skin is warm and dry. No rash noted. No diaphoresis. No erythema. No pallor. Pscyh: Normal mood and affect. Behavior is normal. Judgment and thought content normal.   LABORATORY RESULTS: No results found for this or any previous visit (from the past 48 hour(s)).   RADIOLOGY RESULTS: Ct Abdomen Pelvis W Contrast  Result Date: 12/28/2016 CLINICAL DATA:  History of diverticulitis in May of 2018. Continued left lower quadrant pain status post 2 rounds of antibiotics. Low grade fevers. Evaluate for abscess. History of colon cancer with resection in June of 2017. History of splenectomy and cholecystectomy. EXAM: CT ABDOMEN AND PELVIS WITH CONTRAST TECHNIQUE: Multidetector CT imaging of the abdomen and pelvis was performed using the standard protocol following bolus administration of intravenous contrast. CONTRAST:  139mL ISOVUE-300 IOPAMIDOL (ISOVUE-300) INJECTION 61% COMPARISON:  CT abdomen dated 11/28/2016. FINDINGS: Lower chest: No acute abnormality. Hepatobiliary: Mixed attenuation within the liver, probable mixture of fatty infiltration and fatty sparing. No circumscribed mass or lesion is seen within the liver. Patient is status post cholecystectomy. No bile duct dilatation. Pancreas: Unremarkable. No pancreatic ductal dilatation or surrounding inflammatory changes. Spleen: Status post splenectomy Adrenals/Urinary Tract: Adrenal glands appear normal. Kidneys appear normal without mass, stone or hydronephrosis. No ureteral or bladder calculi identified. Bladder is  unremarkable, probable reactive thickening of the superior bladder wall secondary to overlying mesenteric inflammation (detailed below). Stomach/Bowel: Marked thickening of the walls of the lower sigmoid colon, at least mildly worsened compared to the previous exam, compatible with the given history of diverticulitis. Diverticulosis noted within the mid and lower sigmoid colon. Complex phlegmonous collection just above the involved segment of the sigmoid colon, with associated with extraluminal air, measuring 4.2 x 2.6 x 2.8 cm, indicating contained perforation and at least some component of an abscess but no drainable fluid is seen. No associated bowel obstruction. No other site of bowel wall inflammation. Vascular/Lymphatic: Numerous small lymph nodes are seen within the upper pelvis, retroperitoneum and central mesenteries, all presumably reactive in nature. Mild atherosclerosis of the abdominal aorta. Reproductive: Prostate is unremarkable. Other: No free intraperitoneal air elsewhere in the abdomen or pelvis. Musculoskeletal: No acute or suspicious osseous finding. Superficial  soft tissues are unremarkable. IMPRESSION: 1. Marked thickening of the walls of the lower sigmoid colon, worsened compared to earlier CT abdomen of 11/28/2016, compatible with given history of diverticulitis. 2. Complex phlegmonous-appearing collection, with associated extraluminal air, located just above the involved segment of the lower sigmoid colon (contiguous with the sigmoid colon wall), measuring 4.2 x 2.6 x 2.8 cm, consistent with contained perforation. This complex extraluminal collection just above the sigmoid colon appears phlegmonous although may represent early developing abscess. At this time, no drainable fluid component is seen. 3. Numerous small lymph nodes within the upper pelvis, retroperitoneum and central abdominal mesentery. These are suspected to be reactive in nature, however, follow-up CT is recommended after  current issues are resolved to ensure stability or resolution and exclude the possibility of neoplastic lymphadenopathy given patient's history of prior colon cancer. 4. Heterogeneity of the liver parenchyma, particularly within the upper right liver lobe, but without circumscribed mass or lesion, suspected fatty infiltration and fatty sparing. Previous CT also described a vague 1.8 cm hypodensity within the left liver lobe which appears stable in the short-term interval. Given patient's history of colon cancer, would consider liver MRI after current issues are resolved. 5. Aortic atherosclerosis. These results and recommendations were called by telephone at the time of interpretation on 12/28/2016 at 2:18 pm to Dr. Warren Lacy ESTERWOOD , who verbally acknowledged these results. Electronically Signed   By: Franki Cabot M.D.   On: 12/28/2016 14:21    Problem List: Patient Active Problem List   Diagnosis Date Noted  . Diverticulitis 12/28/2016  . Diverticulitis of colon with perforation 11/29/2016  . Lesion of liver 11/29/2016  . Genetic testing 03/02/2016  . Cancer of ascending colon ([place]) 01/10/2016  . Right leg DVT ([place]) 01/10/2016  . Anemia of chronic disease 12/31/2015  . Symptomatic cholelithiasis 12/31/2015  . Splenic mass 12/31/2015    Assessment & Plan: Acute diverticulitis with phlegmon.  Would treat with IV antibiotics, clear liquids, and observation for now.  Hopefully this can be managed medically.  Will follow    Matt B. Hassell Done, MD, [facility] [phone] beeper [phone]  12/28/2016 5:30 PM

## 2016-12-28 NOTE — H&P (Signed)
History and Physical    Troy Lambert. WUJ:[id number] DOB: Aug 15, 1970  DOA: 12/28/2016 PCP: Everardo Beals, NP  Patient coming from: Home   Chief Complaint: Abdominal pain and fever   HPI: Troy Lambert. is a 46 y.o. 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 Patient had an episode of diverticulitis with microperforation in 11/2016 which was managed with ABX. Since then patient has not feel right. In that interval patient has been treated with multiple courses of Augment for persistent pain and intermittent fevers. Patient was seen by GI yesterday whom recommended CT abdomen, which showed worsening of diverticulitis with possible abscess. Surgery was consulted and recommended admission.   Patient is admitted with worsening LLQ pain for the past 2-3 weeks, that is 5-6/10 and intermittent fevers. No other associated symptoms are noted. Pain did not improved after oral abx therapy. He denies N/V/D/C. No chest pain or SOB.   Review of Systems:   General: no changes in body weight, no fever chills or decrease in energy.  HEENT: no blurry vision, hearing changes or sore throat Respiratory: no dyspnea, coughing, wheezing CV: no chest pain, no palpitations GI: See HPI  GU: no dysuria, burning on urination, increased urinary frequency, hematuria  Ext:. No deformities,  Neuro: no unilateral weakness, numbness, or tingling, no vision change or hearing loss Skin: No rashes, lesions or wounds. MSK: No muscle spasm, no deformity, no limitation of range of movement in spin Heme: No easy bruising.  Travel history: No recent long distant travel.   Past Medical History:  Diagnosis Date  . Allergy   . Anemia    iron def anemia  . Cancer ([place]) 12/2015   sqamous cell, basal cell skin cancers. Colon Cancer dx. surgery planned  . Colon cancer ([place])   . Diverticulosis    never a problem    . Right leg DVT ([place]) 01/10/2016    Past Surgical History:  Procedure Laterality Date  . CHOLECYSTECTOMY N/A 12/27/2015   Procedure: LAPAROSCOPIC CHOLECYSTECTOMY WITH INTRAOPERATIVE CHOLANGIOGRAM;  Surgeon: Greer Pickerel, MD;  Location: [facility];  Service: General;  Laterality: N/A;  . LAPAROSCOPIC PARTIAL COLECTOMY N/A 12/27/2015   Procedure: LAPAROSCOPIC PARTIAL RIGHT COLECTOMY;  Surgeon: Greer Pickerel, MD;  Location: [facility];  Service: General;  Laterality: N/A;  . 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   Procedure:  LAPAROSCOPIC SPLENECTOMY;  Surgeon: Greer Pickerel, MD;  Location: [facility];  Service: General;  Laterality: N/A;  . TONSILLECTOMY    . VASECTOMY    . WISDOM TOOTH EXTRACTION       reports that he has never smoked. His smokeless tobacco use includes Chew. He reports that he drinks alcohol. He reports that he does not use drugs.  Allergies  Allergen Reactions  . Dilaudid [Hydromorphone Hcl] Itching    Family History  Problem Relation Age of Onset  . Lung cancer Father 66       mets to bone  . Prostate cancer Paternal Uncle   . Lung cancer Maternal Grandmother        smoker and worked in [employer]  . Lung cancer Maternal Grandfather   . Kidney cancer Maternal Grandfather   . Heart attack Paternal Grandfather   . Brain cancer Paternal Uncle        dx in his 83s  . Colon cancer Neg Hx   . Esophageal cancer Neg Hx   .  Pancreatic cancer Neg Hx   . Rectal cancer Neg Hx   . Stomach cancer Neg Hx    Family hx reviewed and non pertinent   Prior to Admission medications   Medication Sig Start Date End Date Taking? Authorizing Provider  amoxicillin-clavulanate (AUGMENTIN) 875-125 MG tablet Take 1 tablet by mouth every 12 (twelve) hours. 12/02/16  Yes Thurnell Lose, MD  docusate sodium (COLACE) 100 MG capsule Take 2 capsules (200 mg total) by mouth 2 (two) times daily. 12/02/16 12/02/17 Yes Thurnell Lose, MD  HYDROcodone-acetaminophen (NORCO/VICODIN) 5-325 MG tablet Take 1  tablet by mouth every 6 (six) hours as needed for severe pain. 12/02/16  Yes Thurnell Lose, MD  loratadine (CLARITIN) 10 MG tablet Take 10 mg by mouth daily as needed for allergies.    Yes [provider]  Multiple Vitamin (MULTIVITAMIN) tablet Take 1 tablet by mouth daily.   Yes [provider]  Probiotic Product (PROBIOTIC-10 PO) Take 1 tablet by mouth daily.   Yes [provider]  testosterone cypionate (DEPOTESTOSTERONE CYPIONATE) 200 MG/ML injection Inject 100 mg into the muscle every 14 (fourteen) days.  06/19/16  Yes [provider]  zolpidem (AMBIEN) 10 MG tablet Take 5 mg by mouth at bedtime as needed for sleep.    Yes [provider]    Physical Exam: Vitals:   12/28/16 1606  BP: 134/86  Pulse: 79  Resp: 16  Temp: 98.1 F (36.7 C)  TempSrc: Oral  SpO2: 96%  Weight: 97.1 kg (214 lb)  Height: 6\' 2"  (1.88 m)     Constitutional: NAD, calm, comfortable Eyes: PERRL, lids and conjunctivae normal ENMT: Mucous membranes are moist.  Neck: normal, supple, no masses, no thyromegaly Respiratory: clear to auscultation bilaterally, no wheezing, no crackles. Normal respiratory effort. Cardiovascular: Regular rate and rhythm, no murmurs / rubs / gallops. No extremity edema. 2+ pedal pulses.  Abdomen: Soft, non distended, mild LLQ pain, rebound negative, tympanic to palpation, + BS   Musculoskeletal: no clubbing / cyanosis. No joint deformity upper and lower extremities. Good ROM, no contractures. Normal muscle tone.  Skin: no rashes, lesions, ulcers. No induration Neurologic: CN 2-12 grossly intact. Strength 5/5 in all 4.  Psychiatric: Normal judgment and insight. Alert and oriented x 3. Normal mood.   Labs on Admission: I have personally reviewed following labs and imaging studies  CBC: No results for input(s): WBC, NEUTROABS, HGB, HCT, MCV, PLT in the last 168 hours. Basic Metabolic Panel: No results for input(s): NA, K, CL, CO2,  GLUCOSE, BUN, CREATININE, CALCIUM, MG, PHOS in the last 168 hours. GFR: CrCl cannot be calculated (Patient's most recent lab result is older than the maximum 21 days allowed.). Liver Function Tests: No results for input(s): AST, ALT, ALKPHOS, BILITOT, PROT, ALBUMIN in the last 168 hours. No results for input(s): LIPASE, AMYLASE in the last 168 hours. No results for input(s): AMMONIA in the last 168 hours. Coagulation Profile: No results for input(s): INR, PROTIME in the last 168 hours. Cardiac Enzymes: No results for input(s): CKTOTAL, CKMB, CKMBINDEX, TROPONINI in the last 168 hours. BNP (last 3 results) No results for input(s): PROBNP in the last 8760 hours. HbA1C: No results for input(s): HGBA1C in the last 72 hours. CBG: No results for input(s): GLUCAP in the last 168 hours. Lipid Profile: No results for input(s): CHOL, HDL, LDLCALC, TRIG, CHOLHDL, LDLDIRECT in the last 72 hours. Thyroid Function Tests: No results for input(s): TSH, T4TOTAL, FREET4, T3FREE, THYROIDAB in the last 72  hours. Anemia Panel: No results for input(s): VITAMINB12, FOLATE, FERRITIN, TIBC, IRON, RETICCTPCT in the last 72 hours. Urine analysis:    Component Value Date/Time   COLORURINE STRAW (A) 11/28/2016 1956   APPEARANCEUR CLEAR 11/28/2016 1956   LABSPEC 1.006 11/28/2016 1956   PHURINE 5.0 11/28/2016 1956   GLUCOSEU NEGATIVE 11/28/2016 1956   HGBUR NEGATIVE 11/28/2016 1956   BILIRUBINUR NEGATIVE 11/28/2016 1956   KETONESUR NEGATIVE 11/28/2016 1956   PROTEINUR NEGATIVE 11/28/2016 1956   NITRITE NEGATIVE 11/28/2016 1956   LEUKOCYTESUR NEGATIVE 11/28/2016 1956   Sepsis Labs: !!!!!!!!!!!!!!!!!!!!!!!!!!!!!!!!!!!!!!!!!!!! @LABRCNTIP (procalcitonin:4,lacticidven:4) )No results found for this or any previous visit (from the past 240 hour(s)).   Radiological Exams on Admission: Ct Abdomen Pelvis W Contrast  Result Date: 12/28/2016 CLINICAL DATA:  History of diverticulitis in May of 2018. Continued left  lower quadrant pain status post 2 rounds of antibiotics. Low grade fevers. Evaluate for abscess. History of colon cancer with resection in June of 2017. History of splenectomy and cholecystectomy. EXAM: CT ABDOMEN AND PELVIS WITH CONTRAST TECHNIQUE: Multidetector CT imaging of the abdomen and pelvis was performed using the standard protocol following bolus administration of intravenous contrast. CONTRAST:  133mL ISOVUE-300 IOPAMIDOL (ISOVUE-300) INJECTION 61% COMPARISON:  CT abdomen dated 11/28/2016. FINDINGS: Lower chest: No acute abnormality. Hepatobiliary: Mixed attenuation within the liver, probable mixture of fatty infiltration and fatty sparing. No circumscribed mass or lesion is seen within the liver. Patient is status post cholecystectomy. No bile duct dilatation. Pancreas: Unremarkable. No pancreatic ductal dilatation or surrounding inflammatory changes. Spleen: Status post splenectomy Adrenals/Urinary Tract: Adrenal glands appear normal. Kidneys appear normal without mass, stone or hydronephrosis. No ureteral or bladder calculi identified. Bladder is unremarkable, probable reactive thickening of the superior bladder wall secondary to overlying mesenteric inflammation (detailed below). Stomach/Bowel: Marked thickening of the walls of the lower sigmoid colon, at least mildly worsened compared to the previous exam, compatible with the given history of diverticulitis. Diverticulosis noted within the mid and lower sigmoid colon. Complex phlegmonous collection just above the involved segment of the sigmoid colon, with associated with extraluminal air, measuring 4.2 x 2.6 x 2.8 cm, indicating contained perforation and at least some component of an abscess but no drainable fluid is seen. No associated bowel obstruction. No other site of bowel wall inflammation. Vascular/Lymphatic: Numerous small lymph nodes are seen within the upper pelvis, retroperitoneum and central mesenteries, all presumably reactive in  nature. Mild atherosclerosis of the abdominal aorta. Reproductive: Prostate is unremarkable. Other: No free intraperitoneal air elsewhere in the abdomen or pelvis. Musculoskeletal: No acute or suspicious osseous finding. Superficial soft tissues are unremarkable. IMPRESSION: 1. Marked thickening of the walls of the lower sigmoid colon, worsened compared to earlier CT abdomen of 11/28/2016, compatible with given history of diverticulitis. 2. Complex phlegmonous-appearing collection, with associated extraluminal air, located just above the involved segment of the lower sigmoid colon (contiguous with the sigmoid colon wall), measuring 4.2 x 2.6 x 2.8 cm, consistent with contained perforation. This complex extraluminal collection just above the sigmoid colon appears phlegmonous although may represent early developing abscess. At this time, no drainable fluid component is seen. 3. Numerous small lymph nodes within the upper pelvis, retroperitoneum and central abdominal mesentery. These are suspected to be reactive in nature, however, follow-up CT is recommended after current issues are resolved to ensure stability or resolution and exclude the possibility of neoplastic lymphadenopathy given patient's history of prior colon cancer. 4. Heterogeneity of the liver parenchyma, particularly within the upper right liver lobe, but without  circumscribed mass or lesion, suspected fatty infiltration and fatty sparing. Previous CT also described a vague 1.8 cm hypodensity within the left liver lobe which appears stable in the short-term interval. Given patient's history of colon cancer, would consider liver MRI after current issues are resolved. 5. Aortic atherosclerosis. These results and recommendations were called by telephone at the time of interpretation on 12/28/2016 at 2:18 pm to Dr. Warren Lacy ESTERWOOD , who verbally acknowledged these results. Electronically Signed   By: Franki Cabot M.D.   On: 12/28/2016 14:21     Assessment/Plan Recurrent Diverticulitis  CT abdomen concern for abscess with contained perforation Has failed multiple trial of abx as outpatient  Admit to medsurg  Will start Flagyl and Cipro  Morphine PRN pain  NPO for now  Surgery consulted they will see in AM  I have ordered blood cultures, and routine labs.   Hx of colon adenocarcinoma  Stable s/p R hemicolectomy, patient follows with Dr Edison Nasuti GI, Dr [name] Surgery and Dr Benay Spice oncology   Nonspecific liver hypodensity  Likely fatty liver, follow up with MRI recommended when resolution of current issues are resolved. Defer to outpatient w/u upon discharge   DVT prophylaxis: Lovenox  Code Status: FULL  Family Communication: Son at bedside  Disposition Plan: Anticipate discharge to previous home environment.  Consults called: Surgery - Dr [name]  Admission status: Medsurg Inpatient   Chipper Oman MD [facility] Pager: Text Page via [email or web address]  [phone]  If 7PM-7AM, please contact night-coverage [email or web address] Password [facility]  12/28/2016, 5:16 PM

## 2016-12-28 NOTE — Telephone Encounter (Signed)
Spoke to patient and advised him, Per Nicoletta Ba PA , he is to go to [facility], Admitting department first floor.  He will be on the  [place] floor. The patient is on his way to the hospital.

## 2016-12-28 NOTE — Telephone Encounter (Signed)
Amy Esterwood PA did speak to the radiologist, Dr. Enriqueta Shutter regarding this patient.

## 2016-12-28 NOTE — Progress Notes (Signed)
I agree with the above note, plan 

## 2016-12-29 DIAGNOSIS — K5720 Diverticulitis of large intestine with perforation and abscess without bleeding: Principal | ICD-10-CM

## 2016-12-29 DIAGNOSIS — K760 Fatty (change of) liver, not elsewhere classified: Secondary | ICD-10-CM

## 2016-12-29 DIAGNOSIS — K5792 Diverticulitis of intestine, part unspecified, without perforation or abscess without bleeding: Secondary | ICD-10-CM

## 2016-12-29 MED ORDER — CIPROFLOXACIN HCL 500 MG PO TABS
500.0000 mg | ORAL_TABLET | Freq: Two times a day (BID) | ORAL | Status: DC
  Administered 2016-12-29 – 2016-12-30 (×2): 500 mg via ORAL
  Filled 2016-12-29 (×2): qty 1

## 2016-12-29 MED ORDER — SODIUM CHLORIDE 0.9 % IV SOLN
INTRAVENOUS | Status: DC
  Administered 2016-12-29 – 2016-12-30 (×3): via INTRAVENOUS

## 2016-12-29 MED ORDER — METRONIDAZOLE 500 MG PO TABS
500.0000 mg | ORAL_TABLET | Freq: Four times a day (QID) | ORAL | Status: DC
  Administered 2016-12-29 – 2016-12-30 (×4): 500 mg via ORAL
  Filled 2016-12-29 (×4): qty 1

## 2016-12-29 NOTE — Progress Notes (Signed)
PROGRESS NOTE    Troy Lambert.   WJX:[id number]  DOB: August 11, 1970  DOA: 12/28/2016 PCP: Everardo Beals, NP   Brief Narrative:  Troy Lambert. is a 46 y.o. 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 Patient had an episode of diverticulitis with microperforation in 11/2016 which was managed with Augmentin but did not improve completely. He has has had further Augmentin but has continued to have issues with abdominal pain and fever and chills. Patient was seen by GI yesterday whom recommended CT abdomen, which showed worsening of diverticulitis with possible abscess. Surgery was consulted and recommended admission.    Subjective: Lower abdominal pain has improved. No new complaints. Tolerating liquid diet.   Assessment & Plan:   Principal Problem:   Diverticulitis with phlegmon and prior microperforation Leukocytosis - would recommend at least another 24 hrs of IV antibiotics prior to transitioning to oral - Gen surgery recommends 3 wks of oral antibiotics.  - ok to advance to full liquids today  Active Problems:   Cancer of ascending colon  - in remission    Fatty liver - have addressed with patient  DVT prophylaxis: Lovenox Code Status: Full code Family Communication:  Disposition Plan: home when stable Consultants:   gen surgery Procedures:    Antimicrobials:  Anti-infectives    Start     Dose/Rate Route Frequency Ordered Stop   12/29/16 2000  ciprofloxacin (CIPRO) tablet 500 mg     500 mg Oral 2 times daily 12/29/16 0833     12/29/16 1000  metroNIDAZOLE (FLAGYL) tablet 500 mg     500 mg Oral Every 6 hours 12/29/16 0833     12/28/16 1800  metroNIDAZOLE (FLAGYL) IVPB 500 mg  Status:  Discontinued     500 mg 100 mL/hr over 60 Minutes Intravenous Every 8 hours 12/28/16 1716 12/29/16 0833   12/28/16 1800  ciprofloxacin (CIPRO) IVPB 400 mg  Status:   Discontinued     400 mg 200 mL/hr over 60 Minutes Intravenous Every 12 hours 12/28/16 1716 12/29/16 0833       Objective: Vitals:   12/28/16 1606 12/28/16 2102 12/29/16 0613  BP: 134/86 106/83 110/69  Pulse: 79 82 70  Resp: 16 16 16   Temp: 98.1 F (36.7 C) 98.5 F (36.9 C) 97.9 F (36.6 C)  TempSrc: Oral Oral Oral  SpO2: 96% 97% 97%  Weight: 97.1 kg (214 lb)    Height: 6\' 2"  (1.88 m)      Intake/Output Summary (Last 24 hours) at 12/29/16 1336 Last data filed at 12/29/16 2130  Gross per 24 hour  Intake           241.25 ml  Output                0 ml  Net           241.25 ml   Filed Weights   12/28/16 1606  Weight: 97.1 kg (214 lb)    Examination: General exam: Appears comfortable  HEENT: PERRLA, oral mucosa moist, no sclera icterus or thrush Respiratory system: Clear to auscultation. Respiratory effort normal. Cardiovascular system: S1 & S2 heard, RRR.  No murmurs  Gastrointestinal system: Abdomen soft, tender in mid lower abdomen, nondistended. Normal bowel sound. No organomegaly Central nervous system: Alert and oriented. No focal neurological deficits. Extremities: No cyanosis, clubbing or edema Skin: No rashes or ulcers Psychiatry:  Mood &  affect appropriate.     Data Reviewed: I have personally reviewed following labs and imaging studies  CBC:  Recent Labs Lab 12/28/16 1730  WBC 12.1*  NEUTROABS 7.6  HGB 15.0  HCT 43.9  MCV 88.9  PLT 628   Basic Metabolic Panel:  Recent Labs Lab 12/28/16 1730  NA 138  K 4.2  CL 103  CO2 25  GLUCOSE 94  BUN 9  CREATININE 1.02  CALCIUM 9.0  MG 2.0  PHOS 2.9   GFR: Estimated Creatinine Clearance: 104.1 mL/min (by C-G formula based on SCr of 1.02 mg/dL). Liver Function Tests:  Recent Labs Lab 12/28/16 1730  AST 36  ALT 51  ALKPHOS 64  BILITOT 0.7  PROT 7.5  ALBUMIN 3.9   No results for input(s): LIPASE, AMYLASE in the last 168 hours. No results for input(s): AMMONIA in the last 168  hours. Coagulation Profile: No results for input(s): INR, PROTIME in the last 168 hours. Cardiac Enzymes: No results for input(s): CKTOTAL, CKMB, CKMBINDEX, TROPONINI in the last 168 hours. BNP (last 3 results) No results for input(s): PROBNP in the last 8760 hours. HbA1C: No results for input(s): HGBA1C in the last 72 hours. CBG: No results for input(s): GLUCAP in the last 168 hours. Lipid Profile: No results for input(s): CHOL, HDL, LDLCALC, TRIG, CHOLHDL, LDLDIRECT in the last 72 hours. Thyroid Function Tests: No results for input(s): TSH, T4TOTAL, FREET4, T3FREE, THYROIDAB in the last 72 hours. Anemia Panel: No results for input(s): VITAMINB12, FOLATE, FERRITIN, TIBC, IRON, RETICCTPCT in the last 72 hours. Urine analysis:    Component Value Date/Time   COLORURINE STRAW (A) 11/28/2016 1956   APPEARANCEUR CLEAR 11/28/2016 1956   LABSPEC 1.006 11/28/2016 1956   PHURINE 5.0 11/28/2016 1956   GLUCOSEU NEGATIVE 11/28/2016 1956   HGBUR NEGATIVE 11/28/2016 1956   BILIRUBINUR NEGATIVE 11/28/2016 1956   KETONESUR NEGATIVE 11/28/2016 1956   PROTEINUR NEGATIVE 11/28/2016 1956   NITRITE NEGATIVE 11/28/2016 1956   LEUKOCYTESUR NEGATIVE 11/28/2016 1956   Sepsis Labs: @LABRCNTIP (procalcitonin:4,lacticidven:4) )No results found for this or any previous visit (from the past 240 hour(s)).       Radiology Studies: Ct Abdomen Pelvis W Contrast  Result Date: 12/28/2016 CLINICAL DATA:  History of diverticulitis in May of 2018. Continued left lower quadrant pain status post 2 rounds of antibiotics. Low grade fevers. Evaluate for abscess. History of colon cancer with resection in June of 2017. History of splenectomy and cholecystectomy. EXAM: CT ABDOMEN AND PELVIS WITH CONTRAST TECHNIQUE: Multidetector CT imaging of the abdomen and pelvis was performed using the standard protocol following bolus administration of intravenous contrast. CONTRAST:  161mL ISOVUE-300 IOPAMIDOL (ISOVUE-300) INJECTION  61% COMPARISON:  CT abdomen dated 11/28/2016. FINDINGS: Lower chest: No acute abnormality. Hepatobiliary: Mixed attenuation within the liver, probable mixture of fatty infiltration and fatty sparing. No circumscribed mass or lesion is seen within the liver. Patient is status post cholecystectomy. No bile duct dilatation. Pancreas: Unremarkable. No pancreatic ductal dilatation or surrounding inflammatory changes. Spleen: Status post splenectomy Adrenals/Urinary Tract: Adrenal glands appear normal. Kidneys appear normal without mass, stone or hydronephrosis. No ureteral or bladder calculi identified. Bladder is unremarkable, probable reactive thickening of the superior bladder wall secondary to overlying mesenteric inflammation (detailed below). Stomach/Bowel: Marked thickening of the walls of the lower sigmoid colon, at least mildly worsened compared to the previous exam, compatible with the given history of diverticulitis. Diverticulosis noted within the mid and lower sigmoid colon. Complex phlegmonous collection just above the involved segment of the  sigmoid colon, with associated with extraluminal air, measuring 4.2 x 2.6 x 2.8 cm, indicating contained perforation and at least some component of an abscess but no drainable fluid is seen. No associated bowel obstruction. No other site of bowel wall inflammation. Vascular/Lymphatic: Numerous small lymph nodes are seen within the upper pelvis, retroperitoneum and central mesenteries, all presumably reactive in nature. Mild atherosclerosis of the abdominal aorta. Reproductive: Prostate is unremarkable. Other: No free intraperitoneal air elsewhere in the abdomen or pelvis. Musculoskeletal: No acute or suspicious osseous finding. Superficial soft tissues are unremarkable. IMPRESSION: 1. Marked thickening of the walls of the lower sigmoid colon, worsened compared to earlier CT abdomen of 11/28/2016, compatible with given history of diverticulitis. 2. Complex  phlegmonous-appearing collection, with associated extraluminal air, located just above the involved segment of the lower sigmoid colon (contiguous with the sigmoid colon wall), measuring 4.2 x 2.6 x 2.8 cm, consistent with contained perforation. This complex extraluminal collection just above the sigmoid colon appears phlegmonous although may represent early developing abscess. At this time, no drainable fluid component is seen. 3. Numerous small lymph nodes within the upper pelvis, retroperitoneum and central abdominal mesentery. These are suspected to be reactive in nature, however, follow-up CT is recommended after current issues are resolved to ensure stability or resolution and exclude the possibility of neoplastic lymphadenopathy given patient's history of prior colon cancer. 4. Heterogeneity of the liver parenchyma, particularly within the upper right liver lobe, but without circumscribed mass or lesion, suspected fatty infiltration and fatty sparing. Previous CT also described a vague 1.8 cm hypodensity within the left liver lobe which appears stable in the short-term interval. Given patient's history of colon cancer, would consider liver MRI after current issues are resolved. 5. Aortic atherosclerosis. These results and recommendations were called by telephone at the time of interpretation on 12/28/2016 at 2:18 pm to Dr. Warren Lacy ESTERWOOD , who verbally acknowledged these results. Electronically Signed   By: Franki Cabot M.D.   On: 12/28/2016 14:21      Scheduled Meds: . ciprofloxacin  500 mg Oral BID  . enoxaparin (LOVENOX) injection  40 mg Subcutaneous Q24H  . metroNIDAZOLE  500 mg Oral Q6H   Continuous Infusions: . sodium chloride 75 mL/hr at 12/29/16 1038     LOS: 1 day    Time spent in minutes: 35    Debbe Odea, MD [facility] Pager: [email or web address] Password [email or web address] 12/29/2016, 1:36 PM

## 2016-12-29 NOTE — Progress Notes (Signed)
Subjective: Feels much better.  Passing flatus and having bowel function  Objective: Vital signs in last 24 hours: Temp:  [97.9 F (36.6 C)-98.5 F (36.9 C)] 97.9 F (36.6 C) (06/16 4765) Pulse Rate:  [70-82] 70 (06/16 0613) Resp:  [16] 16 (06/16 0613) BP: (106-134)/(69-86) 110/69 (06/16 0613) SpO2:  [96 %-97 %] 97 % (06/16 0613) Weight:  [97.1 kg (214 lb)] 97.1 kg (214 lb) (06/15 1606) Last BM Date: 12/28/16  Intake/Output from previous day: 06/15 0701 - 06/16 0700 In: 241.3 [I.V.:41.3; IV Piggyback:200] Out: -  Intake/Output this shift: No intake/output data recorded.  General appearance: alert and cooperative GI: normal findings: soft, non-tender  Lab Results:  Results for orders placed or performed during the hospital encounter of 12/28/16 (from the past 24 hour(s))  Comprehensive metabolic panel     Status: None   Collection Time: 12/28/16  5:30 PM  Result Value Ref Range   Sodium 138 135 - 145 mmol/L   Potassium 4.2 3.5 - 5.1 mmol/L   Chloride 103 101 - 111 mmol/L   CO2 25 22 - 32 mmol/L   Glucose, Bld 94 65 - 99 mg/dL   BUN 9 6 - 20 mg/dL   Creatinine, Ser 1.02 0.61 - 1.24 mg/dL   Calcium 9.0 8.9 - 10.3 mg/dL   Total Protein 7.5 6.5 - 8.1 g/dL   Albumin 3.9 3.5 - 5.0 g/dL   AST 36 15 - 41 U/L   ALT 51 17 - 63 U/L   Alkaline Phosphatase 64 38 - 126 U/L   Total Bilirubin 0.7 0.3 - 1.2 mg/dL   GFR calc non Af Amer >60 >60 mL/min   GFR calc Af Amer >60 >60 mL/min   Anion gap 10 5 - 15  Phosphorus     Status: None   Collection Time: 12/28/16  5:30 PM  Result Value Ref Range   Phosphorus 2.9 2.5 - 4.6 mg/dL  Magnesium     Status: None   Collection Time: 12/28/16  5:30 PM  Result Value Ref Range   Magnesium 2.0 1.7 - 2.4 mg/dL  CBC WITH DIFFERENTIAL     Status: Abnormal   Collection Time: 12/28/16  5:30 PM  Result Value Ref Range   WBC 12.1 (H) 4.0 - 10.5 K/uL   RBC 4.94 4.22 - 5.81 MIL/uL   Hemoglobin 15.0 13.0 - 17.0 g/dL   HCT 43.9 39.0 - 52.0 %   MCV 88.9 78.0 - 100.0 fL   MCH 30.4 26.0 - 34.0 pg   MCHC 34.2 30.0 - 36.0 g/dL   RDW 15.3 11.5 - 15.5 %   Platelets 384 150 - 400 K/uL   Neutrophils Relative % 62 %   Neutro Abs 7.6 1.7 - 7.7 K/uL   Lymphocytes Relative 22 %   Lymphs Abs 2.7 0.7 - 4.0 K/uL   Monocytes Relative 13 %   Monocytes Absolute 1.5 (H) 0.1 - 1.0 K/uL   Eosinophils Relative 3 %   Eosinophils Absolute 0.3 0.0 - 0.7 K/uL   Basophils Relative 0 %   Basophils Absolute 0.0 0.0 - 0.1 K/uL     Studies/Results Radiology     MEDS, Scheduled . ciprofloxacin  500 mg Oral BID  . enoxaparin (LOVENOX) injection  40 mg Subcutaneous Q24H  . metroNIDAZOLE  500 mg Oral Q6H     Assessment: <principal problem not specified> Diverticulitis: recurrent   Plan: Can switch to PO meds and advance diet Anticipate d/c tom if wbc trends back down  LOS: 1 day    Rosario Adie, [facility], [place]   12/29/2016 8:34 AM

## 2016-12-30 LAB — CBC
HCT: 41.1 % (ref 39.0–52.0)
HEMOGLOBIN: 14 g/dL (ref 13.0–17.0)
MCH: 30.4 pg (ref 26.0–34.0)
MCHC: 34.1 g/dL (ref 30.0–36.0)
MCV: 89.2 fL (ref 78.0–100.0)
PLATELETS: 418 10*3/uL — AB (ref 150–400)
RBC: 4.61 MIL/uL (ref 4.22–5.81)
RDW: 15.4 % (ref 11.5–15.5)
WBC: 14 10*3/uL — ABNORMAL HIGH (ref 4.0–10.5)

## 2016-12-30 MED ORDER — CIPROFLOXACIN IN D5W 400 MG/200ML IV SOLN
400.0000 mg | Freq: Two times a day (BID) | INTRAVENOUS | Status: DC
  Administered 2016-12-30 – 2017-01-01 (×4): 400 mg via INTRAVENOUS
  Filled 2016-12-30 (×4): qty 200

## 2016-12-30 MED ORDER — METRONIDAZOLE IN NACL 5-0.79 MG/ML-% IV SOLN
500.0000 mg | Freq: Three times a day (TID) | INTRAVENOUS | Status: DC
  Administered 2016-12-30 – 2017-01-01 (×6): 500 mg via INTRAVENOUS
  Filled 2016-12-30 (×8): qty 100

## 2016-12-30 NOTE — Progress Notes (Signed)
   Subjective: Had a lot of crampy pain last night.  Still passing flatus and having bowel function  Objective: Vital signs in last 24 hours: Temp:  [98 F (36.7 C)-98.2 F (36.8 C)] 98.2 F (36.8 C) (06/17 0538) Pulse Rate:  [65-74] 74 (06/17 0538) Resp:  [16-18] 16 (06/17 0538) BP: (112-126)/(73-83) 112/73 (06/17 0538) SpO2:  [96 %-98 %] 96 % (06/17 0538) Last BM Date: 12/29/16  Intake/Output from previous day: 06/16 0701 - 06/17 0700 In: 1000 [P.O.:1000] Out: -  Intake/Output this shift: No intake/output data recorded.  General appearance: alert and cooperative GI: normal findings: soft, non-tender  Lab Results:  Results for orders placed or performed during the hospital encounter of 12/28/16 (from the past 24 hour(s))  CBC     Status: Abnormal   Collection Time: 12/30/16  4:09 AM  Result Value Ref Range   WBC 14.0 (H) 4.0 - 10.5 K/uL   RBC 4.61 4.22 - 5.81 MIL/uL   Hemoglobin 14.0 13.0 - 17.0 g/dL   HCT 41.1 39.0 - 52.0 %   MCV 89.2 78.0 - 100.0 fL   MCH 30.4 26.0 - 34.0 pg   MCHC 34.1 30.0 - 36.0 g/dL   RDW 15.4 11.5 - 15.5 %   Platelets 418 (H) 150 - 400 K/uL     Studies/Results Radiology     MEDS, Scheduled . ciprofloxacin  500 mg Oral BID  . enoxaparin (LOVENOX) injection  40 mg Subcutaneous Q24H  . metroNIDAZOLE  500 mg Oral Q6H     Assessment: Diverticulitis Diverticulitis: recurrent   Plan:  WBC elevated today: I am concerned he is developing an abscess.  Will order CT scan for tom AM Can cont PO meds and soft diet as tolerated    LOS: 2 days    Rosario Adie, MD [facility], [place] [phone]   12/30/2016 7:26 AM

## 2016-12-30 NOTE — Progress Notes (Signed)
PROGRESS NOTE    Troy Lambert.   JME:[id number]  DOB: 08/30/1970  DOA: 12/28/2016 PCP: Everardo Beals, NP   Brief Narrative:  Troy Lambert. is a 46 y.o. 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 Patient had an episode of diverticulitis with microperforation in 11/2016 which was managed with Augmentin but did not improve completely. He has has had further Augmentin but has continued to have issues with abdominal pain and fever and chills. Patient was seen by GI yesterday whom recommended CT abdomen, which showed worsening of diverticulitis with possible abscess. Surgery was consulted and recommended admission.    Subjective: Lower abdominal pain has recurred. He feels as bad as he did prior to admission.   Assessment & Plan:   Principal Problem:   Diverticulitis with phlegmon and prior microperforation Leukocytosis - would recommend at least another 24 hrs of IV antibiotics prior to transitioning to oral - Gen surgery recommends 3 wks of oral antibiotics.  - changed to oral antibiotics yesterday and symptoms recurred- have changed back to IV Cipro and Flagyl again today - I expect another 24 to 48 hrs of IV antibiotics  - cont IVF and liquid diet  Active Problems:   Cancer of ascending colon  - in remission    Fatty liver - have addressed with patient  DVT prophylaxis: Lovenox Code Status: Full code Family Communication:  Disposition Plan: home when stable Consultants:   gen surgery Procedures:    Antimicrobials:  Anti-infectives    Start     Dose/Rate Route Frequency Ordered Stop   12/30/16 2000  ciprofloxacin (CIPRO) IVPB 400 mg     400 mg 200 mL/hr over 60 Minutes Intravenous Every 12 hours 12/30/16 0956     12/30/16 1400  metroNIDAZOLE (FLAGYL) IVPB 500 mg     500 mg 100 mL/hr over 60 Minutes Intravenous Every 8 hours 12/30/16 0956     12/29/16  2000  ciprofloxacin (CIPRO) tablet 500 mg  Status:  Discontinued     500 mg Oral 2 times daily 12/29/16 0833 12/30/16 0956   12/29/16 1000  metroNIDAZOLE (FLAGYL) tablet 500 mg  Status:  Discontinued     500 mg Oral Every 6 hours 12/29/16 0833 12/30/16 0956   12/28/16 1800  metroNIDAZOLE (FLAGYL) IVPB 500 mg  Status:  Discontinued     500 mg 100 mL/hr over 60 Minutes Intravenous Every 8 hours 12/28/16 1716 12/29/16 0833   12/28/16 1800  ciprofloxacin (CIPRO) IVPB 400 mg  Status:  Discontinued     400 mg 200 mL/hr over 60 Minutes Intravenous Every 12 hours 12/28/16 1716 12/29/16 0833       Objective: Vitals:   12/29/16 0613 12/29/16 1411 12/29/16 2125 12/30/16 0538  BP: 110/69 121/73 126/83 112/73  Pulse: 70 65 67 74  Resp: 16 18 16 16   Temp: 97.9 F (36.6 C) 98 F (36.7 C) 98.2 F (36.8 C) 98.2 F (36.8 C)  TempSrc: Oral Oral Oral Oral  SpO2: 97% 97% 98% 96%  Weight:      Height:        Intake/Output Summary (Last 24 hours) at 12/30/16 1328 Last data filed at 12/30/16 0900  Gross per 24 hour  Intake             1360 ml  Output  0 ml  Net             1360 ml   Filed Weights   12/28/16 1606  Weight: 97.1 kg (214 lb)    Examination: General exam: Appears comfortable  HEENT: PERRLA, oral mucosa moist, no sclera icterus or thrush Respiratory system: Clear to auscultation. Respiratory effort normal. Cardiovascular system: S1 & S2 heard, RRR.  No murmurs  Gastrointestinal system: Abdomen soft, quite tender in mid lower abdomen again today, nondistended. Normal bowel sound. No organomegaly Central nervous system: Alert and oriented. No focal neurological deficits. Extremities: No cyanosis, clubbing or edema Skin: No rashes or ulcers Psychiatry:  Mood & affect appropriate.     Data Reviewed: I have personally reviewed following labs and imaging studies  CBC:  Recent Labs Lab 12/28/16 1730 12/30/16 0409  WBC 12.1* 14.0*  NEUTROABS 7.6  --   HGB  15.0 14.0  HCT 43.9 41.1  MCV 88.9 89.2  PLT 384 485*   Basic Metabolic Panel:  Recent Labs Lab 12/28/16 1730  NA 138  K 4.2  CL 103  CO2 25  GLUCOSE 94  BUN 9  CREATININE 1.02  CALCIUM 9.0  MG 2.0  PHOS 2.9   GFR: Estimated Creatinine Clearance: 104.1 mL/min (by C-G formula based on SCr of 1.02 mg/dL). Liver Function Tests:  Recent Labs Lab 12/28/16 1730  AST 36  ALT 51  ALKPHOS 64  BILITOT 0.7  PROT 7.5  ALBUMIN 3.9   No results for input(s): LIPASE, AMYLASE in the last 168 hours. No results for input(s): AMMONIA in the last 168 hours. Coagulation Profile: No results for input(s): INR, PROTIME in the last 168 hours. Cardiac Enzymes: No results for input(s): CKTOTAL, CKMB, CKMBINDEX, TROPONINI in the last 168 hours. BNP (last 3 results) No results for input(s): PROBNP in the last 8760 hours. HbA1C: No results for input(s): HGBA1C in the last 72 hours. CBG: No results for input(s): GLUCAP in the last 168 hours. Lipid Profile: No results for input(s): CHOL, HDL, LDLCALC, TRIG, CHOLHDL, LDLDIRECT in the last 72 hours. Thyroid Function Tests: No results for input(s): TSH, T4TOTAL, FREET4, T3FREE, THYROIDAB in the last 72 hours. Anemia Panel: No results for input(s): VITAMINB12, FOLATE, FERRITIN, TIBC, IRON, RETICCTPCT in the last 72 hours. Urine analysis:    Component Value Date/Time   COLORURINE STRAW (A) 11/28/2016 1956   APPEARANCEUR CLEAR 11/28/2016 1956   LABSPEC 1.006 11/28/2016 1956   PHURINE 5.0 11/28/2016 1956   GLUCOSEU NEGATIVE 11/28/2016 1956   HGBUR NEGATIVE 11/28/2016 1956   BILIRUBINUR NEGATIVE 11/28/2016 1956   KETONESUR NEGATIVE 11/28/2016 1956   PROTEINUR NEGATIVE 11/28/2016 1956   NITRITE NEGATIVE 11/28/2016 1956   LEUKOCYTESUR NEGATIVE 11/28/2016 1956   Sepsis Labs: @LABRCNTIP (procalcitonin:4,lacticidven:4) )No results found for this or any previous visit (from the past 240 hour(s)).       Radiology Studies: Ct Abdomen  Pelvis W Contrast  Result Date: 12/28/2016 CLINICAL DATA:  History of diverticulitis in May of 2018. Continued left lower quadrant pain status post 2 rounds of antibiotics. Low grade fevers. Evaluate for abscess. History of colon cancer with resection in June of 2017. History of splenectomy and cholecystectomy. EXAM: CT ABDOMEN AND PELVIS WITH CONTRAST TECHNIQUE: Multidetector CT imaging of the abdomen and pelvis was performed using the standard protocol following bolus administration of intravenous contrast. CONTRAST:  17mL ISOVUE-300 IOPAMIDOL (ISOVUE-300) INJECTION 61% COMPARISON:  CT abdomen dated 11/28/2016. FINDINGS: Lower chest: No acute abnormality. Hepatobiliary: Mixed attenuation within the liver, probable mixture  of fatty infiltration and fatty sparing. No circumscribed mass or lesion is seen within the liver. Patient is status post cholecystectomy. No bile duct dilatation. Pancreas: Unremarkable. No pancreatic ductal dilatation or surrounding inflammatory changes. Spleen: Status post splenectomy Adrenals/Urinary Tract: Adrenal glands appear normal. Kidneys appear normal without mass, stone or hydronephrosis. No ureteral or bladder calculi identified. Bladder is unremarkable, probable reactive thickening of the superior bladder wall secondary to overlying mesenteric inflammation (detailed below). Stomach/Bowel: Marked thickening of the walls of the lower sigmoid colon, at least mildly worsened compared to the previous exam, compatible with the given history of diverticulitis. Diverticulosis noted within the mid and lower sigmoid colon. Complex phlegmonous collection just above the involved segment of the sigmoid colon, with associated with extraluminal air, measuring 4.2 x 2.6 x 2.8 cm, indicating contained perforation and at least some component of an abscess but no drainable fluid is seen. No associated bowel obstruction. No other site of bowel wall inflammation. Vascular/Lymphatic: Numerous small  lymph nodes are seen within the upper pelvis, retroperitoneum and central mesenteries, all presumably reactive in nature. Mild atherosclerosis of the abdominal aorta. Reproductive: Prostate is unremarkable. Other: No free intraperitoneal air elsewhere in the abdomen or pelvis. Musculoskeletal: No acute or suspicious osseous finding. Superficial soft tissues are unremarkable. IMPRESSION: 1. Marked thickening of the walls of the lower sigmoid colon, worsened compared to earlier CT abdomen of 11/28/2016, compatible with given history of diverticulitis. 2. Complex phlegmonous-appearing collection, with associated extraluminal air, located just above the involved segment of the lower sigmoid colon (contiguous with the sigmoid colon wall), measuring 4.2 x 2.6 x 2.8 cm, consistent with contained perforation. This complex extraluminal collection just above the sigmoid colon appears phlegmonous although may represent early developing abscess. At this time, no drainable fluid component is seen. 3. Numerous small lymph nodes within the upper pelvis, retroperitoneum and central abdominal mesentery. These are suspected to be reactive in nature, however, follow-up CT is recommended after current issues are resolved to ensure stability or resolution and exclude the possibility of neoplastic lymphadenopathy given patient's history of prior colon cancer. 4. Heterogeneity of the liver parenchyma, particularly within the upper right liver lobe, but without circumscribed mass or lesion, suspected fatty infiltration and fatty sparing. Previous CT also described a vague 1.8 cm hypodensity within the left liver lobe which appears stable in the short-term interval. Given patient's history of colon cancer, would consider liver MRI after current issues are resolved. 5. Aortic atherosclerosis. These results and recommendations were called by telephone at the time of interpretation on 12/28/2016 at 2:18 pm to Dr. Warren Lacy ESTERWOOD , who verbally  acknowledged these results. Electronically Signed   By: Franki Cabot M.D.   On: 12/28/2016 14:21      Scheduled Meds: . enoxaparin (LOVENOX) injection  40 mg Subcutaneous Q24H   Continuous Infusions: . sodium chloride 75 mL/hr at 12/30/16 1218  . ciprofloxacin    . metronidazole 500 mg (12/30/16 1313)     LOS: 2 days    Time spent in minutes: 35    Debbe Odea, MD [facility] Pager: [email or web address] Password [email or web address] 12/30/2016, 1:28 PM

## 2016-12-31 ENCOUNTER — Inpatient Hospital Stay (HOSPITAL_COMMUNITY): Payer: BC Managed Care – PPO

## 2016-12-31 ENCOUNTER — Encounter (HOSPITAL_COMMUNITY): Payer: Self-pay | Admitting: Radiology

## 2016-12-31 DIAGNOSIS — C182 Malignant neoplasm of ascending colon: Secondary | ICD-10-CM

## 2016-12-31 LAB — BASIC METABOLIC PANEL
ANION GAP: 6 (ref 5–15)
BUN: 7 mg/dL (ref 6–20)
CALCIUM: 9.2 mg/dL (ref 8.9–10.3)
CO2: 28 mmol/L (ref 22–32)
CREATININE: 1.05 mg/dL (ref 0.61–1.24)
Chloride: 106 mmol/L (ref 101–111)
GFR calc Af Amer: >60 mL/min (ref >60)
Glucose, Bld: 100 mg/dL — ABNORMAL HIGH (ref 65–99)
Potassium: 4.4 mmol/L (ref 3.5–5.1)
Sodium: 140 mmol/L (ref 135–145)

## 2016-12-31 LAB — CBC
HCT: 43.8 % (ref 39.0–52.0)
Hemoglobin: 14.9 g/dL (ref 13.0–17.0)
MCH: 30.7 pg (ref 26.0–34.0)
MCHC: 34 g/dL (ref 30.0–36.0)
MCV: 90.3 fL (ref 78.0–100.0)
PLATELETS: 472 10*3/uL — AB (ref 150–400)
RBC: 4.85 MIL/uL (ref 4.22–5.81)
RDW: 15.3 % (ref 11.5–15.5)
WBC: 7.8 10*3/uL (ref 4.0–10.5)

## 2016-12-31 MED ORDER — IOPAMIDOL (ISOVUE-300) INJECTION 61%
INTRAVENOUS | Status: AC
  Filled 2016-12-31: qty 30

## 2016-12-31 MED ORDER — IOPAMIDOL (ISOVUE-300) INJECTION 61%
30.0000 mL | Freq: Once | INTRAVENOUS | Status: AC | PRN
  Administered 2016-12-31: 30 mL via ORAL

## 2016-12-31 MED ORDER — IOPAMIDOL (ISOVUE-300) INJECTION 61%
100.0000 mL | Freq: Once | INTRAVENOUS | Status: AC | PRN
  Administered 2016-12-31: 100 mL via INTRAVENOUS

## 2016-12-31 MED ORDER — FLORANEX PO PACK
1.0000 g | PACK | Freq: Three times a day (TID) | ORAL | Status: DC
  Administered 2016-12-31: 1 g via ORAL
  Filled 2016-12-31 (×4): qty 1

## 2016-12-31 MED ORDER — IOPAMIDOL (ISOVUE-300) INJECTION 61%
INTRAVENOUS | Status: AC
  Filled 2016-12-31: qty 100

## 2016-12-31 NOTE — Progress Notes (Signed)
[facility] Office:  [phone] General Surgery Progress Note   LOS: 3 days  POD -     Chief Complaint: Abdominal pain  Assessment and Plan: 1.  Diverticulitis  WBC - 7,800 - 12/31/2016  Cipro/Flagyl - 6/15 >>>  Abd CT scan (12/31/2016) is improved.  He clinically looks much better today  Will leave on full liquids for today  2.  History of hepatic flex colon cancer  T3, N0 (0/32/ nodes)  3.  DVT prophylaxis - Lovenox   Principal Problem:   Diverticulitis Active Problems:   Cancer of ascending colon ([place])   Diverticulitis of colon with perforation   Fatty liver   Subjective:  Feels much better than yesterday.  Passing flatus. Wife in room. They have a lot of questions.    Objective:   Vitals:   12/30/16 2041 12/31/16 0522  BP: (!) 137/98 114/75  Pulse: 67 67  Resp: 20 18  Temp: 98.2 F (36.8 C) 97.9 F (36.6 C)     Intake/Output from previous day:  06/17 0701 - 06/18 0700 In: 840 [P.O.:840] Out: -   Intake/Output this shift:  No intake/output data recorded.   Physical Exam:   General: WN AM who is alert and oriented.    HEENT: Normal. Pupils equal. .   Lungs: Clear.   Abdomen: Soft.  BS present.  No localized tenderness.   Lab Results:    Recent Labs  12/30/16 0409 12/31/16 0750  WBC 14.0* 7.8  HGB 14.0 14.9  HCT 41.1 43.8  PLT 418* 472*    BMET   Recent Labs  12/28/16 1730 12/31/16 0750  NA 138 140  K 4.2 4.4  CL 103 106  CO2 25 28  GLUCOSE 94 100*  BUN 9 7  CREATININE 1.02 1.05  CALCIUM 9.0 9.2    PT/INR  No results for input(s): LABPROT, INR in the last 72 hours.  ABG  No results for input(s): PHART, HCO3 in the last 72 hours.  Invalid input(s): PCO2, PO2   Studies/Results:  No results found.   Anti-infectives:   Anti-infectives    Start     Dose/Rate Route Frequency Ordered Stop   12/30/16 2000  ciprofloxacin (CIPRO) IVPB 400 mg     400 mg 200 mL/hr over 60 Minutes Intravenous Every 12 hours  12/30/16 0956     12/30/16 1400  metroNIDAZOLE (FLAGYL) IVPB 500 mg     500 mg 100 mL/hr over 60 Minutes Intravenous Every 8 hours 12/30/16 0956     12/29/16 2000  ciprofloxacin (CIPRO) tablet 500 mg  Status:  Discontinued     500 mg Oral 2 times daily 12/29/16 0833 12/30/16 0956   12/29/16 1000  metroNIDAZOLE (FLAGYL) tablet 500 mg  Status:  Discontinued     500 mg Oral Every 6 hours 12/29/16 0833 12/30/16 0956   12/28/16 1800  metroNIDAZOLE (FLAGYL) IVPB 500 mg  Status:  Discontinued     500 mg 100 mL/hr over 60 Minutes Intravenous Every 8 hours 12/28/16 1716 12/29/16 0833   12/28/16 1800  ciprofloxacin (CIPRO) IVPB 400 mg  Status:  Discontinued     400 mg 200 mL/hr over 60 Minutes Intravenous Every 12 hours 12/28/16 1716 12/29/16 0833      Alphonsa Overall, MD, FACS Pager: [facility] Office: [phone] 12/31/2016

## 2016-12-31 NOTE — Progress Notes (Signed)
Pls see Dr Pollie Friar note from earlier today.  I mainly did a social visit with pt/wife/son today  He reports that he clinically feels much improved then the weekend and can tell a difference with IV antibiotics compared to when he was switched to oral antibiotics on the weekend. He states that after his last hospitalization he just never got back to his baseline  We discussed the repeat CT findings today. Radiographically his inflammation and infection looks better  I agree with Dr. Pollie Friar plan. I would keep on IV antibiotics another 36-48 hours before transitioning back to orals We discussed that hopefully he will resolve this current flare and then we can have an outpatient conversation about the pros and cons of elective sigmoid partial colectomy  We also discussed the possibility that he may not actually get over this flare and he could get into a vicious cycle of repeated hospitalizations. If that ends up being the case then he would definitely need surgery  Given his prior right colectomy and after reviewing his CT scans from this admission I do think that he has enough remaining distal colon at least to do a resection of the definitively involved segment without having to undergo a subtotal colectomy. However he may not be able to get a complete sigmoid colon resection I explained that might have to be an intraoperative decision if and when we get to that point  Total visit time approximately 20 minutes  Leighton Ruff. [name], MD, FACS General, Bariatric, & Minimally Invasive Surgery [facility], [place]

## 2016-12-31 NOTE — Progress Notes (Signed)
PROGRESS NOTE    Troy Lambert.   TIR:[id number]  DOB: 06-15-1971  DOA: 12/28/2016 PCP: Everardo Beals, NP   Brief Narrative:  Troy Wert. is a 46 y.o. 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 Patient had an episode of diverticulitis with microperforation in 11/2016 which was managed with Augmentin but did not improve completely. He has has had further Augmentin but has continued to have issues with abdominal pain and fever and chills. Patient was seen by GI yesterday whom recommended CT abdomen, which showed worsening of diverticulitis with possible abscess. Surgery was consulted and recommended admission.    Subjective: Abdominal pain is improving. No nausea or diarrhea.   Assessment & Plan:   Principal Problem:   Diverticulitis with phlegmon and prior microperforation Leukocytosis - would recommend at least another 24 hrs of IV antibiotics prior to transitioning to oral - Gen surgery recommends 3 wks of oral antibiotics.  - cont IVF and full liquid diet - CT abd/pelvis is reassuring today  Active Problems:   Cancer of ascending colon  - in remission    Fatty liver - have addressed with patient  DVT prophylaxis: Lovenox Code Status: Full code Family Communication:  Disposition Plan: home when stable Consultants:   gen surgery Procedures:    Antimicrobials:  Anti-infectives    Start     Dose/Rate Route Frequency Ordered Stop   12/30/16 2000  ciprofloxacin (CIPRO) IVPB 400 mg     400 mg 200 mL/hr over 60 Minutes Intravenous Every 12 hours 12/30/16 0956     12/30/16 1400  metroNIDAZOLE (FLAGYL) IVPB 500 mg     500 mg 100 mL/hr over 60 Minutes Intravenous Every 8 hours 12/30/16 0956     12/29/16 2000  ciprofloxacin (CIPRO) tablet 500 mg  Status:  Discontinued     500 mg Oral 2 times daily 12/29/16 0833 12/30/16 0956   12/29/16 1000  metroNIDAZOLE  (FLAGYL) tablet 500 mg  Status:  Discontinued     500 mg Oral Every 6 hours 12/29/16 0833 12/30/16 0956   12/28/16 1800  metroNIDAZOLE (FLAGYL) IVPB 500 mg  Status:  Discontinued     500 mg 100 mL/hr over 60 Minutes Intravenous Every 8 hours 12/28/16 1716 12/29/16 0833   12/28/16 1800  ciprofloxacin (CIPRO) IVPB 400 mg  Status:  Discontinued     400 mg 200 mL/hr over 60 Minutes Intravenous Every 12 hours 12/28/16 1716 12/29/16 0833       Objective: Vitals:   12/30/16 0538 12/30/16 1408 12/30/16 2041 12/31/16 0522  BP: 112/73 137/83 (!) 137/98 114/75  Pulse: 74 66 67 67  Resp: 16 18 20 18   Temp: 98.2 F (36.8 C) 98.5 F (36.9 C) 98.2 F (36.8 C) 97.9 F (36.6 C)  TempSrc: Oral Oral Oral Oral  SpO2: 96% 95% 97% 98%  Weight:      Height:       No intake or output data in the 24 hours ending 12/31/16 1332 Filed Weights   12/28/16 1606  Weight: 97.1 kg (214 lb)    Examination: General exam: Appears comfortable  HEENT: PERRLA, oral mucosa moist, no sclera icterus or thrush Respiratory system: Clear to auscultation. Respiratory effort normal. Cardiovascular system: S1 & S2 heard, RRR.  No murmurs  Gastrointestinal system: Abdomen soft, mildly tender in mid lower abdomen today, nondistended. Normal bowel sound. No organomegaly Central nervous system: Alert  and oriented. No focal neurological deficits. Extremities: No cyanosis, clubbing or edema Skin: No rashes or ulcers Psychiatry:  Mood & affect appropriate.     Data Reviewed: I have personally reviewed following labs and imaging studies  CBC:  Recent Labs Lab 12/28/16 1730 12/30/16 0409 12/31/16 0750  WBC 12.1* 14.0* 7.8  NEUTROABS 7.6  --   --   HGB 15.0 14.0 14.9  HCT 43.9 41.1 43.8  MCV 88.9 89.2 90.3  PLT 384 418* 341*   Basic Metabolic Panel:  Recent Labs Lab 12/28/16 1730 12/31/16 0750  NA 138 140  K 4.2 4.4  CL 103 106  CO2 25 28  GLUCOSE 94 100*  BUN 9 7  CREATININE 1.02 1.05  CALCIUM 9.0  9.2  MG 2.0  --   PHOS 2.9  --    GFR: Estimated Creatinine Clearance: 101.1 mL/min (by C-G formula based on SCr of 1.05 mg/dL). Liver Function Tests:  Recent Labs Lab 12/28/16 1730  AST 36  ALT 51  ALKPHOS 64  BILITOT 0.7  PROT 7.5  ALBUMIN 3.9   No results for input(s): LIPASE, AMYLASE in the last 168 hours. No results for input(s): AMMONIA in the last 168 hours. Coagulation Profile: No results for input(s): INR, PROTIME in the last 168 hours. Cardiac Enzymes: No results for input(s): CKTOTAL, CKMB, CKMBINDEX, TROPONINI in the last 168 hours. BNP (last 3 results) No results for input(s): PROBNP in the last 8760 hours. HbA1C: No results for input(s): HGBA1C in the last 72 hours. CBG: No results for input(s): GLUCAP in the last 168 hours. Lipid Profile: No results for input(s): CHOL, HDL, LDLCALC, TRIG, CHOLHDL, LDLDIRECT in the last 72 hours. Thyroid Function Tests: No results for input(s): TSH, T4TOTAL, FREET4, T3FREE, THYROIDAB in the last 72 hours. Anemia Panel: No results for input(s): VITAMINB12, FOLATE, FERRITIN, TIBC, IRON, RETICCTPCT in the last 72 hours. Urine analysis:    Component Value Date/Time   COLORURINE STRAW (A) 11/28/2016 1956   APPEARANCEUR CLEAR 11/28/2016 1956   LABSPEC 1.006 11/28/2016 1956   PHURINE 5.0 11/28/2016 1956   GLUCOSEU NEGATIVE 11/28/2016 1956   HGBUR NEGATIVE 11/28/2016 1956   BILIRUBINUR NEGATIVE 11/28/2016 1956   KETONESUR NEGATIVE 11/28/2016 1956   PROTEINUR NEGATIVE 11/28/2016 1956   NITRITE NEGATIVE 11/28/2016 1956   LEUKOCYTESUR NEGATIVE 11/28/2016 1956   Sepsis Labs: @LABRCNTIP (procalcitonin:4,lacticidven:4) )No results found for this or any previous visit (from the past 240 hour(s)).       Radiology Studies: Ct Abdomen Pelvis W Contrast  Result Date: 12/31/2016 CLINICAL DATA:  Recent diverticulitis, completed antibiotic therapy. Mid pelvic pain has returned. Low-grade fever. History of colon cancer. EXAM: CT  ABDOMEN AND PELVIS WITH CONTRAST TECHNIQUE: Multidetector CT imaging of the abdomen and pelvis was performed using the standard protocol following bolus administration of intravenous contrast. CONTRAST:  43mL ISOVUE-300 IOPAMIDOL (ISOVUE-300) INJECTION 61%, 158mL ISOVUE-300 IOPAMIDOL (ISOVUE-300) INJECTION 61% COMPARISON:  12/28/2016. FINDINGS: Lower chest: Lung bases show no acute findings. Heart size normal. No pericardial or pleural effusion. Hepatobiliary: The liver is mildly heterogeneous, as before. Liver is otherwise unremarkable. Cholecystectomy. No biliary ductal dilatation. Pancreas: Negative. Spleen: Surgically absent. Adrenals/Urinary Tract: Adrenal glands and kidneys are unremarkable. Ureters are decompressed. Bladder is grossly unremarkable. Stomach/Bowel: Stomach and small bowel are unremarkable. Right hemicolectomy. Wall thickening and pericolonic stranding with slight improvement in adjacent phlegmon and air along the superior margin. Loculated collection of fluid in the dependent anatomic pelvis measures 3.8 x 4.1 cm, stable. Remainder of the colon is  unremarkable. Vascular/Lymphatic: Mild atherosclerotic calcification of the aorta without aneurysm. No pathologically enlarged lymph nodes. Retroperitoneal pain and sigmoid mesocolon lymph nodes are subcentimeter in short axis size and likely reactive. Reproductive: Prostate is visualized. Other: Small left inguinal hernia contains fat. Musculoskeletal: Degenerative changes in the spine. IMPRESSION: 1. Sigmoid diverticulitis with improving pericolonic phlegmon and minimal residual extraluminal air. 2. Loculated pelvic free fluid, stable. 3. Reactive lymph nodes in the sigmoid mesocolon. 4.  Aortic atherosclerosis (ICD10-170.0). 5. Mild heterogeneity the liver can be seen with fatty deposition. Electronically Signed   By: Lorin Picket M.D.   On: 12/31/2016 09:03      Scheduled Meds: . enoxaparin (LOVENOX) injection  40 mg Subcutaneous Q24H  .  iopamidol      . iopamidol      . lactobacillus  1 g Oral TID WC   Continuous Infusions: . sodium chloride 50 mL (12/31/16 1004)  . ciprofloxacin Stopped (12/31/16 1470)  . metronidazole Stopped (12/31/16 0623)     LOS: 3 days    Time spent in minutes: 35    Debbe Odea, MD [facility] Pager: [email or web address] Password [email or web address] 12/31/2016, 1:32 PM

## 2017-01-01 MED ORDER — METRONIDAZOLE 500 MG PO TABS: 500.0000 mg | ORAL_TABLET | Freq: Three times a day (TID) | ORAL | 0 refills | Status: AC

## 2017-01-01 MED ORDER — FLORANEX PO PACK: 1.0000 g | PACK | Freq: Three times a day (TID) | ORAL | 0 refills | Status: DC

## 2017-01-01 MED ORDER — CIPROFLOXACIN HCL 500 MG PO TABS: 500.0000 mg | ORAL_TABLET | Freq: Two times a day (BID) | ORAL | 0 refills | Status: AC

## 2017-01-01 NOTE — Discharge Summary (Signed)
Physician Discharge Summary  Troy Lambert. PYK:[id number] DOB: 1971-05-29 DOA: 12/28/2016  PCP: Everardo Beals, NP  Admit date: 12/28/2016 Discharge date: 01/01/2017  Admitted From: home Disposition:  home   Recommendations for Outpatient Follow-up:  1. F/u with Gen surgery in 3 wks  Discharge Condition:  stable   CODE STATUS:  Full code   Consultations:  Gen surgery    Discharge Diagnoses:  Principal Problem:   Diverticulitis of colon with perforation Active Problems:   Cancer of ascending colon ([place])   Fatty liver    Subjective: Abdominal pain has nearly resolved. No nausea, fever or chills.   Brief Summary: Troy Lambertis a 46 y.o.malewith medical history significant of Colon CA stage 2 in remission s/p R hemicolectomy, presented to the medical floor for a direct admission. Patient had an episode of diverticulitis with microperforation in 11/2016 which was managed with Augmentin but did not improve completely. He has has had further Augmentin but has continued to have issues with abdominal pain and fever and chills. Patient was seen by GI yesterday whom recommended CT abdomen, which showed worsening of diverticulitis with possible abscess. Surgery was consulted and recommended admission  Hospital Course:  Principal Problem:   Diverticulitis with phlegmon and prior microperforation   Leukocytosis - has improved with Flagyl and Cipro IV  - a repeat CT abd/pelvis on 6/18 is reassuring and shows resolution of the phlegmon - will give 3 more weeks of Cipro and Flagyl - he will f/u with Dr [name].   Active Problems:   Cancer of ascending colon  - in remission    Fatty liver - have addressed with patient  Discharge Instructions  Discharge Instructions    Diet general    Complete by:  As directed    Low fiber diet for next 2-3 wks   Increase activity slowly    Complete by:  As directed      Allergies as of 01/01/2017      Reactions   Dilaudid  [hydromorphone Hcl] Itching      Medication List    STOP taking these medications   amoxicillin-clavulanate 875-125 MG tablet Commonly known as:  AUGMENTIN     TAKE these medications   ciprofloxacin 500 MG tablet Commonly known as:  CIPRO Take 1 tablet (500 mg total) by mouth 2 (two) times daily.   docusate sodium 100 MG capsule Commonly known as:  COLACE Take 2 capsules (200 mg total) by mouth 2 (two) times daily.   HYDROcodone-acetaminophen 5-325 MG tablet Commonly known as:  NORCO/VICODIN Take 1 tablet by mouth every 6 (six) hours as needed for severe pain.   lactobacillus Pack Take 1 packet (1 g total) by mouth 3 (three) times daily with meals.   loratadine 10 MG tablet Commonly known as:  CLARITIN Take 10 mg by mouth daily as needed for allergies.   metroNIDAZOLE 500 MG tablet Commonly known as:  FLAGYL Take 1 tablet (500 mg total) by mouth 3 (three) times daily.   multivitamin tablet Take 1 tablet by mouth daily.   PROBIOTIC-10 PO Take 1 tablet by mouth daily.   testosterone cypionate 200 MG/ML injection Commonly known as:  DEPOTESTOSTERONE CYPIONATE Inject 100 mg into the muscle every 14 (fourteen) days.   zolpidem 10 MG tablet Commonly known as:  AMBIEN Take 5 mg by mouth at bedtime as needed for sleep.      Follow-up Information    Greer Pickerel, MD. Call.   Specialty:  General Surgery  Why:  Call and make a follow up appointment for 3-4 weeks.  Contact information: [address] [place] [address] [phone]          Allergies  Allergen Reactions  . Dilaudid [Hydromorphone Hcl] Itching     Procedures/Studies:    Ct Abdomen Pelvis W Contrast  Result Date: 12/31/2016 CLINICAL DATA:  Recent diverticulitis, completed antibiotic therapy. Mid pelvic pain has returned. Low-grade fever. History of colon cancer. EXAM: CT ABDOMEN AND PELVIS WITH CONTRAST TECHNIQUE: Multidetector CT imaging of the abdomen and pelvis was performed  using the standard protocol following bolus administration of intravenous contrast. CONTRAST:  36mL ISOVUE-300 IOPAMIDOL (ISOVUE-300) INJECTION 61%, 139mL ISOVUE-300 IOPAMIDOL (ISOVUE-300) INJECTION 61% COMPARISON:  12/28/2016. FINDINGS: Lower chest: Lung bases show no acute findings. Heart size normal. No pericardial or pleural effusion. Hepatobiliary: The liver is mildly heterogeneous, as before. Liver is otherwise unremarkable. Cholecystectomy. No biliary ductal dilatation. Pancreas: Negative. Spleen: Surgically absent. Adrenals/Urinary Tract: Adrenal glands and kidneys are unremarkable. Ureters are decompressed. Bladder is grossly unremarkable. Stomach/Bowel: Stomach and small bowel are unremarkable. Right hemicolectomy. Wall thickening and pericolonic stranding with slight improvement in adjacent phlegmon and air along the superior margin. Loculated collection of fluid in the dependent anatomic pelvis measures 3.8 x 4.1 cm, stable. Remainder of the colon is unremarkable. Vascular/Lymphatic: Mild atherosclerotic calcification of the aorta without aneurysm. No pathologically enlarged lymph nodes. Retroperitoneal pain and sigmoid mesocolon lymph nodes are subcentimeter in short axis size and likely reactive. Reproductive: Prostate is visualized. Other: Small left inguinal hernia contains fat. Musculoskeletal: Degenerative changes in the spine. IMPRESSION: 1. Sigmoid diverticulitis with improving pericolonic phlegmon and minimal residual extraluminal air. 2. Loculated pelvic free fluid, stable. 3. Reactive lymph nodes in the sigmoid mesocolon. 4.  Aortic atherosclerosis (ICD10-170.0). 5. Mild heterogeneity the liver can be seen with fatty deposition. Electronically Signed   By: Lorin Picket M.D.   On: 12/31/2016 09:03   Ct Abdomen Pelvis W Contrast  Result Date: 12/28/2016 CLINICAL DATA:  History of diverticulitis in May of 2018. Continued left lower quadrant pain status post 2 rounds of antibiotics. Low  grade fevers. Evaluate for abscess. History of colon cancer with resection in June of 2017. History of splenectomy and cholecystectomy. EXAM: CT ABDOMEN AND PELVIS WITH CONTRAST TECHNIQUE: Multidetector CT imaging of the abdomen and pelvis was performed using the standard protocol following bolus administration of intravenous contrast. CONTRAST:  141mL ISOVUE-300 IOPAMIDOL (ISOVUE-300) INJECTION 61% COMPARISON:  CT abdomen dated 11/28/2016. FINDINGS: Lower chest: No acute abnormality. Hepatobiliary: Mixed attenuation within the liver, probable mixture of fatty infiltration and fatty sparing. No circumscribed mass or lesion is seen within the liver. Patient is status post cholecystectomy. No bile duct dilatation. Pancreas: Unremarkable. No pancreatic ductal dilatation or surrounding inflammatory changes. Spleen: Status post splenectomy Adrenals/Urinary Tract: Adrenal glands appear normal. Kidneys appear normal without mass, stone or hydronephrosis. No ureteral or bladder calculi identified. Bladder is unremarkable, probable reactive thickening of the superior bladder wall secondary to overlying mesenteric inflammation (detailed below). Stomach/Bowel: Marked thickening of the walls of the lower sigmoid colon, at least mildly worsened compared to the previous exam, compatible with the given history of diverticulitis. Diverticulosis noted within the mid and lower sigmoid colon. Complex phlegmonous collection just above the involved segment of the sigmoid colon, with associated with extraluminal air, measuring 4.2 x 2.6 x 2.8 cm, indicating contained perforation and at least some component of an abscess but no drainable fluid is seen. No associated bowel obstruction. No  other site of bowel wall inflammation. Vascular/Lymphatic: Numerous small lymph nodes are seen within the upper pelvis, retroperitoneum and central mesenteries, all presumably reactive in nature. Mild atherosclerosis of the abdominal aorta. Reproductive:  Prostate is unremarkable. Other: No free intraperitoneal air elsewhere in the abdomen or pelvis. Musculoskeletal: No acute or suspicious osseous finding. Superficial soft tissues are unremarkable. IMPRESSION: 1. Marked thickening of the walls of the lower sigmoid colon, worsened compared to earlier CT abdomen of 11/28/2016, compatible with given history of diverticulitis. 2. Complex phlegmonous-appearing collection, with associated extraluminal air, located just above the involved segment of the lower sigmoid colon (contiguous with the sigmoid colon wall), measuring 4.2 x 2.6 x 2.8 cm, consistent with contained perforation. This complex extraluminal collection just above the sigmoid colon appears phlegmonous although may represent early developing abscess. At this time, no drainable fluid component is seen. 3. Numerous small lymph nodes within the upper pelvis, retroperitoneum and central abdominal mesentery. These are suspected to be reactive in nature, however, follow-up CT is recommended after current issues are resolved to ensure stability or resolution and exclude the possibility of neoplastic lymphadenopathy given patient's history of prior colon cancer. 4. Heterogeneity of the liver parenchyma, particularly within the upper right liver lobe, but without circumscribed mass or lesion, suspected fatty infiltration and fatty sparing. Previous CT also described a vague 1.8 cm hypodensity within the left liver lobe which appears stable in the short-term interval. Given patient's history of colon cancer, would consider liver MRI after current issues are resolved. 5. Aortic atherosclerosis. These results and recommendations were called by telephone at the time of interpretation on 12/28/2016 at 2:18 pm to Dr. Warren Lacy ESTERWOOD , who verbally acknowledged these results. Electronically Signed   By: Franki Cabot M.D.   On: 12/28/2016 14:21       Discharge Exam: Vitals:   12/31/16 2144 01/01/17 0607  BP: 123/87  109/77  Pulse: 68 67  Resp: 19 17  Temp: 98.7 F (37.1 C) 97.7 F (36.5 C)   Vitals:   12/31/16 0522 12/31/16 1431 12/31/16 2144 01/01/17 0607  BP: 114/75 124/76 123/87 109/77  Pulse: 67 69 68 67  Resp: 18 18 19 17   Temp: 97.9 F (36.6 C) 97.7 F (36.5 C) 98.7 F (37.1 C) 97.7 F (36.5 C)  TempSrc: Oral Oral Oral Oral  SpO2: 98% 99% 99% 96%  Weight:      Height:        General: Pt is alert, awake, not in acute distress Cardiovascular: RRR, S1/S2 +, no rubs, no gallops Respiratory: CTA bilaterally, no wheezing, no rhonchi Abdominal: Soft, non-distended, mildly tender in suprapubic area bowel sounds + Extremities: no edema, no cyanosis    The results of significant diagnostics from this hospitalization (including imaging, microbiology, ancillary and laboratory) are listed below for reference.     Microbiology: No results found for this or any previous visit (from the past 240 hour(s)).   Labs: BNP (last 3 results) No results for input(s): BNP in the last 8760 hours. Basic Metabolic Panel:  Recent Labs Lab 12/28/16 1730 12/31/16 0750  NA 138 140  K 4.2 4.4  CL 103 106  CO2 25 28  GLUCOSE 94 100*  BUN 9 7  CREATININE 1.02 1.05  CALCIUM 9.0 9.2  MG 2.0  --   PHOS 2.9  --    Liver Function Tests:  Recent Labs Lab 12/28/16 1730  AST 36  ALT 51  ALKPHOS 64  BILITOT 0.7  PROT 7.5  ALBUMIN  3.9   No results for input(s): LIPASE, AMYLASE in the last 168 hours. No results for input(s): AMMONIA in the last 168 hours. CBC:  Recent Labs Lab 12/28/16 1730 12/30/16 0409 12/31/16 0750  WBC 12.1* 14.0* 7.8  NEUTROABS 7.6  --   --   HGB 15.0 14.0 14.9  HCT 43.9 41.1 43.8  MCV 88.9 89.2 90.3  PLT 384 418* 472*   Cardiac Enzymes: No results for input(s): CKTOTAL, CKMB, CKMBINDEX, TROPONINI in the last 168 hours. BNP: Invalid input(s): POCBNP CBG: No results for input(s): GLUCAP in the last 168 hours. D-Dimer No results for input(s): DDIMER in the  last 72 hours. Hgb A1c No results for input(s): HGBA1C in the last 72 hours. Lipid Profile No results for input(s): CHOL, HDL, LDLCALC, TRIG, CHOLHDL, LDLDIRECT in the last 72 hours. Thyroid function studies No results for input(s): TSH, T4TOTAL, T3FREE, THYROIDAB in the last 72 hours.  Invalid input(s): FREET3 Anemia work up No results for input(s): VITAMINB12, FOLATE, FERRITIN, TIBC, IRON, RETICCTPCT in the last 72 hours. Urinalysis    Component Value Date/Time   COLORURINE STRAW (A) 11/28/2016 1956   APPEARANCEUR CLEAR 11/28/2016 1956   LABSPEC 1.006 11/28/2016 1956   PHURINE 5.0 11/28/2016 1956   GLUCOSEU NEGATIVE 11/28/2016 1956   HGBUR NEGATIVE 11/28/2016 1956   BILIRUBINUR NEGATIVE 11/28/2016 1956   KETONESUR NEGATIVE 11/28/2016 1956   PROTEINUR NEGATIVE 11/28/2016 1956   NITRITE NEGATIVE 11/28/2016 1956   LEUKOCYTESUR NEGATIVE 11/28/2016 1956   Sepsis Labs Invalid input(s): PROCALCITONIN,  WBC,  LACTICIDVEN Microbiology No results found for this or any previous visit (from the past 240 hour(s)).   Time coordinating discharge: Over 30 minutes  SIGNED:   Debbe Odea, MD  [facility] 01/01/2017, 12:05 PM Pager   If 7PM-7AM, please contact night-coverage [email or web address] Password [email or web address]

## 2017-01-01 NOTE — Progress Notes (Signed)
[facility] Progress Note     Subjective: CC: Patient wants to go home Patient states he is feeling much better. 2 loose BM, no pain with BM. Tolerating fulls without increase in pain, n/v. Walked 1.5 miles this AM already.   Objective: Vital signs in last 24 hours: Temp:  [97.7 F (36.5 C)-98.7 F (37.1 C)] 97.7 F (36.5 C) (06/19 0607) Pulse Rate:  [67-69] 67 (06/19 0607) Resp:  [17-19] 17 (06/19 0607) BP: (109-124)/(76-87) 109/77 (06/19 0607) SpO2:  [96 %-99 %] 96 % (06/19 0607) Last BM Date: 12/30/16  Intake/Output from previous day: 06/18 0701 - 06/19 0700 In: 4844.2 [P.O.:240; I.V.:3804.2; IV Piggyback:800] Out: -  Intake/Output this shift: No intake/output data recorded.  PE: Gen:  Alert, NAD, pleasant Card:  Regular rate and rhythm, no M/G/R Pulm:  Normal effort, clear to auscultation bilaterally Abd: Soft, very mildly TTP in suprapubic region, non-distended, bowel sounds present in all 4 quadrants Skin: warm and dry, no rashes  Psych: A&Ox3   Lab Results:   Recent Labs  12/30/16 0409 12/31/16 0750  WBC 14.0* 7.8  HGB 14.0 14.9  HCT 41.1 43.8  PLT 418* 472*   BMET  Recent Labs  12/31/16 0750  NA 140  K 4.4  CL 106  CO2 28  GLUCOSE 100*  BUN 7  CREATININE 1.05  CALCIUM 9.2   CMP     Component Value Date/Time   NA 140 12/31/2016 0750   K 4.4 12/31/2016 0750   CL 106 12/31/2016 0750   CO2 28 12/31/2016 0750   GLUCOSE 100 (H) 12/31/2016 0750   BUN 7 12/31/2016 0750   CREATININE 1.05 12/31/2016 0750   CALCIUM 9.2 12/31/2016 0750   PROT 7.5 12/28/2016 1730   ALBUMIN 3.9 12/28/2016 1730   AST 36 12/28/2016 1730   ALT 51 12/28/2016 1730   ALKPHOS 64 12/28/2016 1730   BILITOT 0.7 12/28/2016 1730   GFRNONAA >60 12/31/2016 0750   GFRAA >60 12/31/2016 0750    Studies/Results: Ct Abdomen Pelvis W Contrast  Result Date: 12/31/2016 CLINICAL DATA:  Recent diverticulitis, completed antibiotic therapy. Mid pelvic pain has  returned. Low-grade fever. History of colon cancer. EXAM: CT ABDOMEN AND PELVIS WITH CONTRAST TECHNIQUE: Multidetector CT imaging of the abdomen and pelvis was performed using the standard protocol following bolus administration of intravenous contrast. CONTRAST:  41mL ISOVUE-300 IOPAMIDOL (ISOVUE-300) INJECTION 61%, 183mL ISOVUE-300 IOPAMIDOL (ISOVUE-300) INJECTION 61% COMPARISON:  12/28/2016. FINDINGS: Lower chest: Lung bases show no acute findings. Heart size normal. No pericardial or pleural effusion. Hepatobiliary: The liver is mildly heterogeneous, as before. Liver is otherwise unremarkable. Cholecystectomy. No biliary ductal dilatation. Pancreas: Negative. Spleen: Surgically absent. Adrenals/Urinary Tract: Adrenal glands and kidneys are unremarkable. Ureters are decompressed. Bladder is grossly unremarkable. Stomach/Bowel: Stomach and small bowel are unremarkable. Right hemicolectomy. Wall thickening and pericolonic stranding with slight improvement in adjacent phlegmon and air along the superior margin. Loculated collection of fluid in the dependent anatomic pelvis measures 3.8 x 4.1 cm, stable. Remainder of the colon is unremarkable. Vascular/Lymphatic: Mild atherosclerotic calcification of the aorta without aneurysm. No pathologically enlarged lymph nodes. Retroperitoneal pain and sigmoid mesocolon lymph nodes are subcentimeter in short axis size and likely reactive. Reproductive: Prostate is visualized. Other: Small left inguinal hernia contains fat. Musculoskeletal: Degenerative changes in the spine. IMPRESSION: 1. Sigmoid diverticulitis with improving pericolonic phlegmon and minimal residual extraluminal air. 2. Loculated pelvic free fluid, stable. 3. Reactive lymph nodes in the sigmoid mesocolon. 4.  Aortic atherosclerosis (ICD10-170.0). 5.  Mild heterogeneity the liver can be seen with fatty deposition. Electronically Signed   By: Lorin Picket M.D.   On: 12/31/2016 09:03     Anti-infectives: Anti-infectives    Start     Dose/Rate Route Frequency Ordered Stop   12/30/16 2000  ciprofloxacin (CIPRO) IVPB 400 mg     400 mg 200 mL/hr over 60 Minutes Intravenous Every 12 hours 12/30/16 0956     12/30/16 1400  metroNIDAZOLE (FLAGYL) IVPB 500 mg     500 mg 100 mL/hr over 60 Minutes Intravenous Every 8 hours 12/30/16 0956     12/29/16 2000  ciprofloxacin (CIPRO) tablet 500 mg  Status:  Discontinued     500 mg Oral 2 times daily 12/29/16 0833 12/30/16 0956   12/29/16 1000  metroNIDAZOLE (FLAGYL) tablet 500 mg  Status:  Discontinued     500 mg Oral Every 6 hours 12/29/16 0833 12/30/16 0956   12/28/16 1800  metroNIDAZOLE (FLAGYL) IVPB 500 mg  Status:  Discontinued     500 mg 100 mL/hr over 60 Minutes Intravenous Every 8 hours 12/28/16 1716 12/29/16 0833   12/28/16 1800  ciprofloxacin (CIPRO) IVPB 400 mg  Status:  Discontinued     400 mg 200 mL/hr over 60 Minutes Intravenous Every 12 hours 12/28/16 1716 12/29/16 0833       Assessment/Plan Diverticulitis - WBC 7.8 6/18, CBC w/ diff for today pending. Afebrile - Cipro/flagyl (6/15>>) - can convert to PO abx later this wk, will need to go home on PO abx - started on lactobacillus yesterday - tolerating fulls, will advance to soft diet - clinically looks stable for discharge Hx of Hepatic flexure colon CA - T3, N0  FEN - advance to soft diet VTE - lovenox ID- IV Cipro/flagyl (6/15>>)  Plan: Advance diet to soft, CBC pending. If CBC looks good could be stable for discharge later today from a surgical standpoint.   Will need 3 weeks PO cipro/flagyl.   Will need to f/u with Dr. [name] to discuss elective sigmoid partial colectomy.   LOS: 4 days    Brigid Re , [facility] 01/01/2017, 8:05 AM Pager: [phone] Consults: [phone]  Agree with above. He will have been 48 hours on IV antibiotics today (since the restart Sunday AM) - he looks good - should be able to switch to oral  Cipro/Flagyl and see Dr. [name] in 3 to 4 weeks - at the end of antibiotic course. There are plans to have Dr. Ardis Hughs do another colonoscopy - for follow up of his colon cancer - but will need to wait until he is clearly over the diverticulitis.  Alphonsa Overall, MD, [facility] Pager: [phone] Office phone:  [phone]

## 2017-01-03 ENCOUNTER — Encounter: Payer: Self-pay | Admitting: Oncology

## 2017-01-07 ENCOUNTER — Ambulatory Visit: Payer: BC Managed Care – PPO | Admitting: Oncology

## 2017-01-07 ENCOUNTER — Other Ambulatory Visit: Payer: BC Managed Care – PPO

## 2017-01-13 HISTORY — PX: COLONOSCOPY: SHX174

## 2017-01-17 ENCOUNTER — Encounter: Payer: Self-pay | Admitting: Oncology

## 2017-01-22 ENCOUNTER — Encounter: Payer: Self-pay | Admitting: Physician Assistant

## 2017-01-22 ENCOUNTER — Ambulatory Visit (INDEPENDENT_AMBULATORY_CARE_PROVIDER_SITE_OTHER): Payer: BC Managed Care – PPO | Admitting: Physician Assistant

## 2017-01-22 VITALS — BP 124/80 | HR 68 | Ht 75.0 in | Wt 212.4 lb

## 2017-01-22 DIAGNOSIS — K5720 Diverticulitis of large intestine with perforation and abscess without bleeding: Secondary | ICD-10-CM | POA: Diagnosis not present

## 2017-01-22 DIAGNOSIS — Z85038 Personal history of other malignant neoplasm of large intestine: Secondary | ICD-10-CM

## 2017-01-22 NOTE — Patient Instructions (Signed)
You have been scheduled for a colonoscopy. Please follow written instructions given to you at your visit today.   If you use inhalers (even only as needed), please bring them with you on the day of your procedure. Your physician has requested that you go to www.[email or web address] and enter the access code given to you at your visit today. This web site gives a general overview about your procedure. However, you should still follow specific instructions given to you by our office regarding your preparation for the procedure.  We may call you with a sooner appointment for the colonoscopy.

## 2017-01-22 NOTE — Progress Notes (Signed)
Chief Complaint: Diverticulitis, H/o Colon cancer  HPI:  Mr. Bogan is a 46 year old Caucasian male with past medical history as listed below known to Dr. Ardis Hughs who was last seen in clinic on 12/27/16 by Nicoletta Ba, PA-C and returns to clinic today for discussion of a colonoscopy for his history of colon cancer and follow-up after recently being seen in the hospital again for diverticulitis with microperforation.   When patient was seen in clinic last, it was noted that he had just been in the hospital due to an episode of diverticulitis with microperforation on 11/28/16-12/02/16. He followed with surgery during that stay and was not seen by GI. He had a CT and pelvis done 11/28/16 which showed acute sigmoid diverticulitis with a few small foci of extraluminal gas consistent with associated perforation and no abscess. He was managed with antibiotic IV antibiotics and sent home on a ten day course of Augmentin. He finished his Augmentin and within a day or 2 felt an increase in lower abdominal pain. He was called in an additional 7 days of Augmentin. He continued with a intermittent low-grade fever and pain and was on his third course of Augmentin 875 twice a day when he saw Amy. She recommended that he have a CT of the abdomen and pelvis with contrast to assess for abscess and/or persistent diverticulitis. Also had the patient anemia MRI of the liver after acute issues with diverticulitis had resolved. 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   Patient was then admitted to the hospital again 12/28/16-01/01/17 for diverticulitis with perforation of the colon. He was started on Flagyl and Cipro IV and a repeat CT abdomen pelvis on 6/18 was reassuring and showed resolution of the phlegmon. He was given 3 more weeks of Cipro and Flagyl.   Today, the patient presents to clinic and tells me that he has finished his  antibiotics. He knows he is due for a colonoscopy and would like to schedule this as soon as possible due to his history of colon cancer. Currently, the patient tells me that he no longer has any pain in his left lower quadrant but occasionally "will feel where it was". Patient has returned having normal bowel movements and overall is improved. He has remained on a low fiber diet at the moment and asks when he should return to a high-fiber diet.   Patient denies a fever, chills, blood in his stool, melena, weight loss, anorexia, nausea, vomiting, heartburn, reflux or symptoms that awaken him at night.   Past Medical History:  Diagnosis Date  . Allergy   . Anemia    iron def anemia  . Cancer ([place]) 12/2015   sqamous cell, basal cell skin cancers. Colon Cancer dx. surgery planned  . Colon cancer ([place])   . Diverticulosis    never a problem  . Right leg DVT ([place]) 01/10/2016    Past Surgical History:  Procedure Laterality Date  . CHOLECYSTECTOMY N/A 12/27/2015   Procedure: LAPAROSCOPIC CHOLECYSTECTOMY WITH INTRAOPERATIVE CHOLANGIOGRAM;  Surgeon: Greer Pickerel, MD;  Location: [facility];  Service: General;  Laterality: N/A;  . LAPAROSCOPIC PARTIAL COLECTOMY N/A 12/27/2015   Procedure: LAPAROSCOPIC PARTIAL RIGHT COLECTOMY;  Surgeon: Greer Pickerel, MD;  Location: [facility];  Service: General;  Laterality: N/A;  . 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 TONSILLECTOMY    . VASECTOMY    . WISDOM TOOTH EXTRACTION      Current Outpatient Prescriptions  Medication Sig Dispense Refill  . loratadine (CLARITIN) 10 MG tablet Take 10 mg by mouth daily as needed for allergies.     . Probiotic Product (PROBIOTIC-10 PO) Take 1 tablet by mouth daily.    [name] Kitchen testosterone cypionate (DEPOTESTOSTERONE CYPIONATE) 200 MG/ML injection Inject 100 mg into the muscle every 14 (fourteen) days.      No  current facility-administered medications for this visit.     Allergies as of 01/22/2017 - Review Complete 01/22/2017  Allergen Reaction Noted  . Dilaudid [hydromorphone hcl] Itching 01/10/2016    Family History  Problem Relation Age of Onset  . Lung cancer Father 62       mets to bone  . Prostate cancer Paternal Uncle   . Lung cancer Maternal Grandmother        smoker and worked in [employer]  . Lung cancer Maternal Grandfather   . Kidney cancer Maternal Grandfather   . Heart attack Paternal Grandfather   . Brain cancer Paternal Uncle        dx in his 48s  . Colon cancer Neg Hx   . Esophageal cancer Neg Hx   . Pancreatic cancer Neg Hx   . Rectal cancer Neg Hx   . Stomach cancer Neg Hx     Social History   Social History  . Marital status: Married    Spouse name: [name]  . Number of children: 2  . Years of education: 12   Occupational History  . Not on file.   Social History Main Topics  . Smoking status: Never Smoker  . Smokeless tobacco: Current User    Types: Chew     Comment: pt used smokless tobacco  . Alcohol use 0.0 oz/week     Comment: [place]  . Drug use: No  . Sexual activity: Not on file   Other Topics Concern  . Not on file   Social History Narrative   Lives with wife   Caffeine use: Drinks coffee daily or monster energy drinks    Review of Systems:    Constitutional: No weight loss, fever or chills Cardiovascular: No chest pain  Respiratory: No SOB  Gastrointestinal: See HPI and otherwise negative   Physical Exam:  Vital signs: BP 124/80   Pulse 68   Ht 6\' 3"  (1.905 m)   Wt 212 lb 6 oz (96.3 kg)   BMI 26.55 kg/m   Constitutional:   Very pleasant Caucasian male appears to be in NAD, Well developed, Well nourished, alert and cooperative Head:  Normocephalic and atraumatic. Eyes:   PEERL, EOMI. No icterus. Conjunctiva pink. Ears:  Normal auditory acuity. Neck:  Supple Throat: Oral cavity and pharynx without inflammation, swelling or  lesion.  Respiratory: Respirations even and unlabored. Lungs clear to auscultation bilaterally.   No wheezes, crackles, or rhonchi.  Cardiovascular: Normal S1, S2. No MRG. Regular rate and rhythm. No peripheral edema, cyanosis or pallor.  Gastrointestinal:  Soft, nondistended, nontender. No rebound or guarding. Normal bowel sounds. No appreciable masses or hepatomegaly. Rectal:  Not performed.  Msk:  Symmetrical without gross deformities. Without edema, no deformity or joint abnormality.  Neurologic:  Alert and  oriented x4;  grossly normal neurologically.  Skin:   Dry and intact without significant lesions or rashes. Psychiatric:  Demonstrates good judgement and reason without abnormal affect or behaviors.  RELEVANT LABS AND IMAGING: CBC    Component Value Date/Time   WBC 7.8 12/31/2016 0750   RBC 4.85 12/31/2016 0750   HGB 14.9 12/31/2016 0750   HGB 15.5 07/12/2016 1140   HCT 43.8 12/31/2016 0750   HCT 45.9 07/12/2016 1140   PLT 472 (H) 12/31/2016 0750   PLT 398 07/12/2016 1140   MCV 90.3 12/31/2016 0750   MCV 92.4 07/12/2016 1140   MCH 30.7 12/31/2016 0750   MCHC 34.0 12/31/2016 0750   RDW 15.3 12/31/2016 0750   RDW 15.2 (H) 07/12/2016 1140   LYMPHSABS 2.7 12/28/2016 1730   LYMPHSABS 3.0 07/12/2016 1140   MONOABS 1.5 (H) 12/28/2016 1730   MONOABS 0.9 07/12/2016 1140   EOSABS 0.3 12/28/2016 1730   EOSABS 0.3 07/12/2016 1140   BASOSABS 0.0 12/28/2016 1730   BASOSABS 0.1 07/12/2016 1140    CMP     Component Value Date/Time   NA 140 12/31/2016 0750   K 4.4 12/31/2016 0750   CL 106 12/31/2016 0750   CO2 28 12/31/2016 0750   GLUCOSE 100 (H) 12/31/2016 0750   BUN 7 12/31/2016 0750   CREATININE 1.05 12/31/2016 0750   CALCIUM 9.2 12/31/2016 0750   PROT 7.5 12/28/2016 1730   ALBUMIN 3.9 12/28/2016 1730   AST 36 12/28/2016 1730   ALT 51 12/28/2016 1730   ALKPHOS 64 12/28/2016 1730   BILITOT 0.7 12/28/2016 1730   GFRNONAA >60 12/31/2016 0750   GFRAA >60 12/31/2016 0750     EXAM: CT ABDOMEN AND PELVIS WITH CONTRAST 12/31/16  TECHNIQUE: Multidetector CT imaging of the abdomen and pelvis was performed using the standard protocol following bolus administration of intravenous contrast.  CONTRAST:  31mL ISOVUE-300 IOPAMIDOL (ISOVUE-300) INJECTION 61%, 180mL ISOVUE-300 IOPAMIDOL (ISOVUE-300) INJECTION 61%  COMPARISON:  12/28/2016.  FINDINGS: Lower chest: Lung bases show no acute findings. Heart size normal. No pericardial or pleural effusion.  Hepatobiliary: The liver is mildly heterogeneous, as before. Liver is otherwise unremarkable. Cholecystectomy. No biliary ductal dilatation.  Pancreas: Negative.  Spleen: Surgically absent.  Adrenals/Urinary Tract: Adrenal glands and kidneys are unremarkable. Ureters are decompressed. Bladder is grossly unremarkable.  Stomach/Bowel: Stomach and small bowel are unremarkable. Right hemicolectomy. Wall thickening and pericolonic stranding with slight improvement in adjacent phlegmon and air along the superior margin. Loculated collection of fluid in the dependent anatomic pelvis measures 3.8 x 4.1 cm, stable. Remainder of the colon is unremarkable.  Vascular/Lymphatic: Mild atherosclerotic calcification of the aorta without aneurysm. No pathologically enlarged lymph nodes. Retroperitoneal pain and sigmoid mesocolon lymph nodes are subcentimeter in short axis size and likely reactive.  Reproductive: Prostate is visualized.  Other: Small left inguinal hernia contains fat.  Musculoskeletal: Degenerative changes in the spine.  IMPRESSION: 1. Sigmoid diverticulitis with improving pericolonic phlegmon and minimal residual extraluminal air. 2. Loculated pelvic free fluid, stable. 3. Reactive lymph nodes in the sigmoid mesocolon. 4.  Aortic atherosclerosis (ICD10-170.0). 5. Mild heterogeneity the liver can be seen with fatty deposition.   Electronically Signed   By: Lorin Picket M.D.    On: 12/31/2016 09:03  Assessment: 1. Sigmoid diverticulitis with microperforation of the colon: See CT above, patient was readmitted to the hospital and started on IV Flagyl and Metronidazole, he remained on this for another 3 weeks and has just finished his antibiotics with no further symptoms 2. 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 Discussed case with Dr.  Ardis Hughs of the time patient's appointment. He recommends colonoscopy in the next 3-4 weeks. Patient was placed on high priority wait list and scheduled with Dr. Ardis Hughs at his current next appointment on September 4. Discussed risks, benefits, limitations and alternatives and the patient agrees to proceed. 2. Recommend patient gradually increase fiber in his diet at this point as he is no longer having any pain. 3. Patient did request that we fill out papers for his new job. I explained that I could do this for him and will notify him when this is ready for pick up. 4. Patient to follow in clinic per recommendations from Dr. Ardis Hughs after time of colonoscopy.  Ellouise Newer, PA-C [facility] 01/22/2017, 11:39 AM  Cc: Everardo Beals, NP

## 2017-01-22 NOTE — Progress Notes (Signed)
I agree with the above note, plan 

## 2017-01-28 ENCOUNTER — Telehealth: Payer: Self-pay

## 2017-01-28 NOTE — Telephone Encounter (Signed)
The pt has been added to the schedule for tomorrow, he was instructed and verbalized understanding.  He will call with any questions.

## 2017-01-28 NOTE — Telephone Encounter (Signed)
Left message for pt to return call, pt is on for 9/4/ and an appt for tomorrow has become available.

## 2017-01-29 ENCOUNTER — Encounter: Payer: Self-pay | Admitting: Gastroenterology

## 2017-01-29 ENCOUNTER — Ambulatory Visit (AMBULATORY_SURGERY_CENTER): Payer: BC Managed Care – PPO | Admitting: Gastroenterology

## 2017-01-29 VITALS — BP 104/53 | HR 70 | Temp 97.8°F | Resp 10 | Ht 75.0 in | Wt 212.0 lb

## 2017-01-29 DIAGNOSIS — Z85038 Personal history of other malignant neoplasm of large intestine: Secondary | ICD-10-CM

## 2017-01-29 DIAGNOSIS — K5730 Diverticulosis of large intestine without perforation or abscess without bleeding: Secondary | ICD-10-CM

## 2017-01-29 DIAGNOSIS — D125 Benign neoplasm of sigmoid colon: Secondary | ICD-10-CM

## 2017-01-29 DIAGNOSIS — D124 Benign neoplasm of descending colon: Secondary | ICD-10-CM | POA: Diagnosis not present

## 2017-01-29 DIAGNOSIS — K635 Polyp of colon: Secondary | ICD-10-CM

## 2017-01-29 MED ORDER — SODIUM CHLORIDE 0.9 % IV SOLN: 500.0000 mL | INTRAVENOUS | Status: AC

## 2017-01-29 NOTE — Progress Notes (Signed)
Called to room to assist during endoscopic procedure.  Patient ID and intended procedure confirmed with present staff. Received instructions for my participation in the procedure from the performing physician.  

## 2017-01-29 NOTE — Op Note (Signed)
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 [name], no adjuvant chemo                            needed Medicines:                Monitored Anesthesia Care Procedure:                Pre-Anesthesia Assessment:                           - Prior to the procedure, a History and Physical                            was performed, and patient medications and                            allergies were reviewed. The patient's tolerance of                            previous anesthesia was also reviewed. The risks                            and benefits of the procedure and the sedation                            options and risks were discussed with the patient.                            All questions were answered, and informed consent                            was obtained. Prior Anticoagulants: The patient has                            taken no previous anticoagulant or antiplatelet                            agents. ASA Grade Assessment: II - A patient with                            mild systemic disease. After reviewing the risks  and benefits, the patient was deemed in                            satisfactory condition to undergo the procedure.                           After obtaining informed consent, the colonoscope                            was passed under direct vision. Throughout the                            procedure, the patient's blood pressure, pulse, and                            oxygen saturations were monitored continuously. The                  Colonoscope was introduced through the anus and                            advanced to the the ileocolonic anastomosis. The                            colonoscopy was performed without difficulty. The                            patient tolerated the procedure well. The quality                            of the bowel preparation was excellent. The rectum                            was photographed. Scope In: 8:35:21 AM Scope Out: 8:55:23 AM Scope Withdrawal Time: 0 hours 14 minutes 15 seconds  Total Procedure Duration: 0 hours 20 minutes 2 seconds  Findings:                 The right hemicolectomy anastomosis was normal                            appearing.                           Two sessile polyps were found in the descending                            colon. The polyps were 2 to 3 mm in size. These                            polyps were removed with a cold snare. Resection                            and retrieval were complete.  A 10 mm polyp was found in the sigmoid colon. The                            polyp was pedunculated. The polyp was removed with                            a hot snare. Resection and retrieval were complete.                           Multiple small and large-mouthed diverticula were                            found in the left colon. At about 20cm from the                            anus there was patchy edema, erythema, mild                            narrowing for about 5cm. This is presumed to be the                            site of recent diverticulitis.                           The exam was otherwise without abnormality on                            direct and retroflexion views. Complications:            No immediate complications. Estimated blood loss:                            None. Estimated Blood Loss:     Estimated blood loss: none. Impression:               - Normal right hemicolectomy anastomosis.                            - Two 2 to 3 mm polyps in the descending colon,                            removed with a cold snare. Resected and retrieved.                           - One 10 mm polyp in the sigmoid colon, removed                            with a hot snare. Resected and retrieved.                           - Diverticulosis in the left colon, including a 5cm                            segment that  is edematous, erythematous (likely the                            site of recent diverticulitis).                           - The examination was otherwise normal on direct                            and retroflexion views. Recommendation:           - Patient has a contact number available for                            emergencies. The signs and symptoms of potential                            delayed complications were discussed with the                            patient. Return to normal activities tomorrow.                            Written discharge instructions were provided to the                            patient.                           - Resume previous diet.                           - Continue present medications.                           You will receive a letter within 2-3 weeks with the                            pathology results and my final recommendations. You                            will likely need repeat colonoscopy in 3 years. Milus Banister, MD 01/29/2017 9:02:08 AM This report has been signed electronically.

## 2017-01-29 NOTE — Progress Notes (Signed)
Report given to PACU, vss 

## 2017-01-29 NOTE — Patient Instructions (Signed)
YOU HAD AN ENDOSCOPIC PROCEDURE TODAY AT THE [facility]:   Refer to the procedure report that was given to you for any specific questions about what was found during the examination.  If the procedure report does not answer your questions, please call your gastroenterologist to clarify.  If you requested that your care partner not be given the details of your procedure findings, then the procedure report has been included in a sealed envelope for you to review at your convenience later.  YOU SHOULD EXPECT: Some feelings of bloating in the abdomen. Passage of more gas than usual.  Walking can help get rid of the air that was put into your GI tract during the procedure and reduce the bloating. If you had a lower endoscopy (such as a colonoscopy or flexible sigmoidoscopy) you may notice spotting of blood in your stool or on the toilet paper. If you underwent a bowel prep for your procedure, you may not have a normal bowel movement for a few days.  Please Note:  You might notice some irritation and congestion in your nose or some drainage.  This is from the oxygen used during your procedure.  There is no need for concern and it should clear up in a day or so.  SYMPTOMS TO REPORT IMMEDIATELY:   Following lower endoscopy (colonoscopy or flexible sigmoidoscopy):  Excessive amounts of blood in the stool  Significant tenderness or worsening of abdominal pains  Swelling of the abdomen that is new, acute  Fever of 100F or higher    For urgent or emergent issues, a gastroenterologist can be reached at any hour by calling [phone].   DIET:  We do recommend a small meal at first, but then you may proceed to your regular diet.  Drink plenty of fluids but you should avoid alcoholic beverages for 24 hours.  ACTIVITY:  You should plan to take it easy for the rest of today and you should NOT DRIVE or use heavy machinery until tomorrow (because of the sedation medicines used during the test).     FOLLOW UP: Our staff will call the number listed on your records the next business day following your procedure to check on you and address any questions or concerns that you may have regarding the information given to you following your procedure. If we do not reach you, we will leave a message.  However, if you are feeling well and you are not experiencing any problems, there is no need to return our call.  We will assume that you have returned to your regular daily activities without incident.  If any biopsies were taken you will be contacted by phone or by letter within the next 1-3 weeks.  Please call us at [phone] if you have not heard about the biopsies in 3 weeks.    SIGNATURES/CONFIDENTIALITY: You and/or your care partner have signed paperwork which will be entered into your electronic medical record.  These signatures attest to the fact that that the information above on your After Visit Summary has been reviewed and is understood.  Full responsibility of the confidentiality of this discharge information lies with you and/or your care-partner.   Resume medications. Information given on polyps and diverticulosis. 

## 2017-01-29 NOTE — Progress Notes (Signed)
Pt's states no medical or surgical changes since previsit or office visit. 

## 2017-01-30 ENCOUNTER — Telehealth: Payer: Self-pay | Admitting: *Deleted

## 2017-01-30 NOTE — Telephone Encounter (Signed)
  Follow up Call-  Call back number 01/29/2017 12/02/2015  Post procedure Call Back phone  # [phone] cell  Permission to leave phone message Yes Yes  Some recent data might be hidden     Patient questions:  Do you have a fever, pain , or abdominal swelling? No. Pain Score  0 *  Have you tolerated food without any problems? Yes.    Have you been able to return to your normal activities? Yes.    Do you have any questions about your discharge instructions: Diet   No. Medications  No. Follow up visit  No.  Do you have questions or concerns about your Care? No.  Actions: * If pain score is 4 or above: No action needed, pain <4.

## 2017-02-08 ENCOUNTER — Encounter: Payer: Self-pay | Admitting: Gastroenterology

## 2017-02-18 ENCOUNTER — Other Ambulatory Visit (HOSPITAL_BASED_OUTPATIENT_CLINIC_OR_DEPARTMENT_OTHER): Payer: BC Managed Care – PPO

## 2017-02-18 ENCOUNTER — Ambulatory Visit (HOSPITAL_BASED_OUTPATIENT_CLINIC_OR_DEPARTMENT_OTHER): Payer: BC Managed Care – PPO | Admitting: Oncology

## 2017-02-18 VITALS — BP 141/83 | HR 71 | Temp 98.0°F | Resp 18 | Ht 75.0 in | Wt 225.0 lb

## 2017-02-18 DIAGNOSIS — Z85038 Personal history of other malignant neoplasm of large intestine: Secondary | ICD-10-CM

## 2017-02-18 DIAGNOSIS — D638 Anemia in other chronic diseases classified elsewhere: Secondary | ICD-10-CM

## 2017-02-18 DIAGNOSIS — C182 Malignant neoplasm of ascending colon: Secondary | ICD-10-CM

## 2017-02-18 LAB — CBC WITH DIFFERENTIAL/PLATELET
BASO%: 0.9 % (ref 0.0–2.0)
BASOS ABS: 0.1 10*3/uL (ref 0.0–0.1)
EOS ABS: 0.3 10*3/uL (ref 0.0–0.5)
EOS%: 2.5 % (ref 0.0–7.0)
HCT: 48.7 % (ref 38.4–49.9)
HEMOGLOBIN: 16.2 g/dL (ref 13.0–17.1)
LYMPH%: 25.1 % (ref 14.0–49.0)
MCH: 30.8 pg (ref 27.2–33.4)
MCHC: 33.3 g/dL (ref 32.0–36.0)
MCV: 92.4 fL (ref 79.3–98.0)
MONO#: 1.1 10*3/uL — AB (ref 0.1–0.9)
MONO%: 9.2 % (ref 0.0–14.0)
NEUT%: 62.3 % (ref 39.0–75.0)
NEUTROS ABS: 7.6 10*3/uL — AB (ref 1.5–6.5)
PLATELETS: 311 10*3/uL (ref 140–400)
RBC: 5.27 10*6/uL (ref 4.20–5.82)
RDW: 15.7 % — ABNORMAL HIGH (ref 11.0–14.6)
WBC: 12.1 10*3/uL — AB (ref 4.0–10.3)
lymph#: 3 10*3/uL (ref 0.9–3.3)

## 2017-02-18 NOTE — Progress Notes (Signed)
  [place] OFFICE PROGRESS NOTE   Diagnosis: Colon cancer  INTERVAL HISTORY:   Mr. Taubman returns for a scheduled visit. He feels completely well at present. He was admitted in May in June with diverticulitis. His symptoms improved with antibiotics. He has no symptoms of diverticulitis at present. Good appetite. A CT 12/31/2016 showed no evidence of recurrent colon cancer. There was evidence of sigmoid diverticulitis with improving pericolonic phlegmon and minimal residual extraluminal air.  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  Objective:  Vital signs in last 24 hours:  Blood pressure (!) 141/83, pulse 71, temperature 98 F (36.7 C), temperature source Oral, resp. rate 18, height '6\' 3"'$  (1.905 m), weight 225 lb (102.1 kg), SpO2 99 %.    HEENT: Neck without mass Lymphatics: No cervical, supraclavicular, axillary, or inguinal nodes Resp: Lungs clear bilaterally Cardio: Regular rate and rhythm GI: No hepatomegaly, nontender, no mass Vascular: No leg edema  Lab Results:  Lab Results  Component Value Date   WBC 12.1 (H) 02/18/2017   HGB 16.2 02/18/2017   HCT 48.7 02/18/2017   MCV 92.4 02/18/2017   PLT 311 02/18/2017   NEUTROABS 7.6 (H) 02/18/2017    Lab Results  Component Value Date   CEA1 3.08 07/12/2016    Medications: I have reviewed the patient's current medications.  Assessment/Plan: 1. Ascending colon cancer, stage II (T3 N0), G2, status post a right colectomy 12/27/2015 ? MSI-stable, no loss of mismatch repair protein expression ? 0/32 lymph nodes involved with metastatic carcinoma  2. 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 History of Iron deficiency anemia secondary to #1-resolved 4. Right lower extremity deep vein thrombosis confirmed on a Doppler ultrasound 01/10/2016-Completed Xarelto 5. Smokeless tobacco use 6. Diverticulitis May and  June 2018    Disposition:  Mr. Kamara is in clinical remission from colon cancer. We will follow-up on the CEA from today. He will return for an office visit and CEA in 6 months. The mild neutrophilia is likely related to the splenectomy. He has no symptoms of an infection. He will contact us for a fever or recurrent symptoms of diverticulitis.  15 minutes were spent with the patient today. The majority of the time was used for counseling and coordination of care.  Donneta Romberg, MD  02/18/2017  4:17 PM

## 2017-02-19 LAB — CEA (IN HOUSE-CHCC): CEA (CHCC-In House): 2.66 ng/mL (ref 0.00–5.00)

## 2017-03-19 ENCOUNTER — Encounter: Payer: BC Managed Care – PPO | Admitting: Gastroenterology

## 2017-04-15 DIAGNOSIS — C439 Malignant melanoma of skin, unspecified: Secondary | ICD-10-CM

## 2017-04-15 HISTORY — DX: Malignant melanoma of skin, unspecified: C43.9

## 2017-05-30 ENCOUNTER — Telehealth: Payer: Self-pay | Admitting: *Deleted

## 2017-05-30 NOTE — Telephone Encounter (Signed)
APP noted pt was added to the schedule 11/16, it was unclear why. Called pt, he reports he noted swelling in L groin after losing his balance and almost falling. He went to PCP who determined it was a swollen lymph node and recommended pt see Dr. Benay Spice. Pt is requesting to come in 11/16. Reviewed with MD. Appt changed so pt will be seen by Dr. Benay Spice.

## 2017-05-31 ENCOUNTER — Encounter: Payer: Self-pay | Admitting: Oncology

## 2017-05-31 ENCOUNTER — Ambulatory Visit: Payer: BC Managed Care – PPO | Admitting: Oncology

## 2017-05-31 ENCOUNTER — Telehealth: Payer: Self-pay

## 2017-05-31 VITALS — BP 135/88 | HR 74 | Temp 97.7°F | Resp 20 | Wt 231.3 lb

## 2017-05-31 DIAGNOSIS — Z85038 Personal history of other malignant neoplasm of large intestine: Secondary | ICD-10-CM

## 2017-05-31 DIAGNOSIS — Z86718 Personal history of other venous thrombosis and embolism: Secondary | ICD-10-CM

## 2017-05-31 DIAGNOSIS — C182 Malignant neoplasm of ascending colon: Secondary | ICD-10-CM

## 2017-05-31 NOTE — Progress Notes (Signed)
  [place] OFFICE PROGRESS NOTE   Diagnosis: Colon cancer  INTERVAL HISTORY:   Troy Lambert returns prior to the scheduled visit.  He has noted a fullness in the left groin for several months.  He saw his primary provider who was concerned this could represent a lymph node. Troy Lambert feels well.  He is exercising regularly.  He is lifting weights.  He has a bowel movement each morning.  Objective:  Vital signs in last 24 hours:  There were no vitals taken for this visit.    HEENT: Neck without mass Lymphatics: No cervical, supraclavicular, axillary, or inguinal nodes Resp: Lungs clear bilaterally Cardio: Regular rate and rhythm GI: No hepatosplenomegaly, nontender.  Slight soft fullness in the left inguinal canal compared to the right side.  The fullness is reducible and increases with Valsalva maneuver.  No scrotal hernia. Vascular: No leg edema   Lab Results:    Lab Results  Component Value Date   CEA1 2.66 02/18/2017     Medications: I have reviewed the patient's current medications.  Assessment/Plan: 1. Ascending colon cancer, stage II (T3 N0), G2, status post a right colectomy 12/27/2015 ? MSI-stable, no loss of mismatch repair protein expression ? 0/32 lymph nodes involved with metastatic carcinoma  2. 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 History of Iron deficiency anemia secondary to #1-resolved 4. Right lower extremity deep vein thrombosis confirmed on a Doppler ultrasound 01/10/2016-CompletedXarelto 5. Smokeless tobacco use 6. Diverticulitis May and June 2018   Disposition:  Troy Lambert remains in clinical remission from colon cancer.  The fullness in the left groin most likely represents an inguinal hernia.  An inguinal hernia was noted on a CT in June.  He will follow-up with Dr. [name] to evaluate the inguinal fullness.  Troy Lambert will return for an office visit and CEA as scheduled in  February.  15 minutes were spent with the patient today.  The majority of the time was used for counseling and coordination of care.  [name], MD  05/31/2017  9:21 AM

## 2017-05-31 NOTE — Telephone Encounter (Signed)
Patient wanted to view schedule for confirmation of appointment date. Per 11/16 los

## 2017-06-22 IMAGING — PT NM PET TUM IMG INITIAL (PI) SKULL BASE T - THIGH
8 series · 25 of 25 positions shown · non-contrast
Comparison: MR abdomen 12/08/2015 and CT chest abdomen pelvis
12/05/2015.

CLINICAL DATA: Initial treatment strategy for colon cancer.

EXAM:
NUCLEAR MEDICINE PET SKULL BASE TO THIGH
TECHNIQUE: 11.6 mCi F-18 FDG was injected intravenously. Full-ring PET imaging
was performed from the skull base to thigh after the radiotracer. CT
data was obtained and used for attenuation correction and anatomic
localization.
FASTING BLOOD GLUCOSE:  Value: 98 mg/dl

[Series 3: pet sk_thigh ac · axial · 5.0mm · 4.07mm/px · z∈[-1162,-190]mm · 5 of 244 slices shown]
[im 1/244]
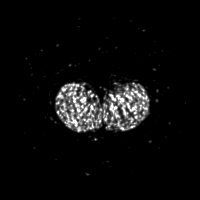
[im 61/244]
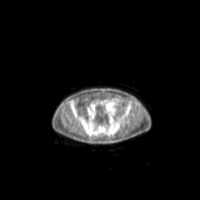
[im 122/244]
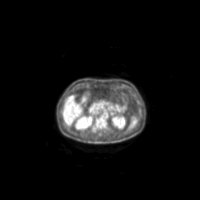
[im 183/244]
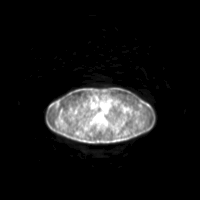
[im 244/244]
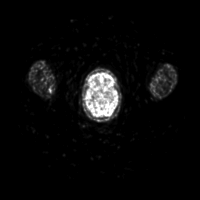

[Series 4: ct sk_thigh 5.0 hd_fov · axial · 5.0mm · 1.14mm/px · z∈[-1162,-190]mm · 5 of 244 slices shown]
[im 1/244]
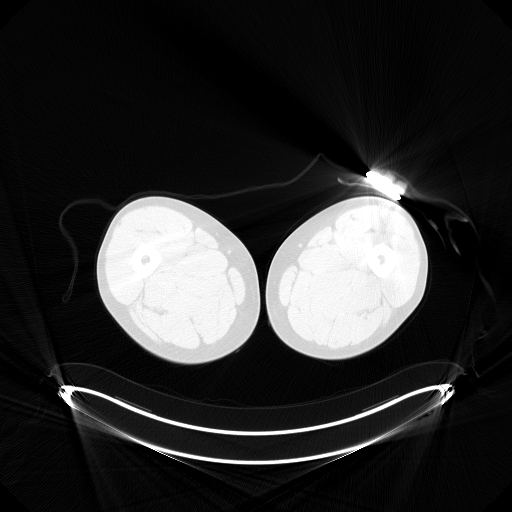
[im 61/244]
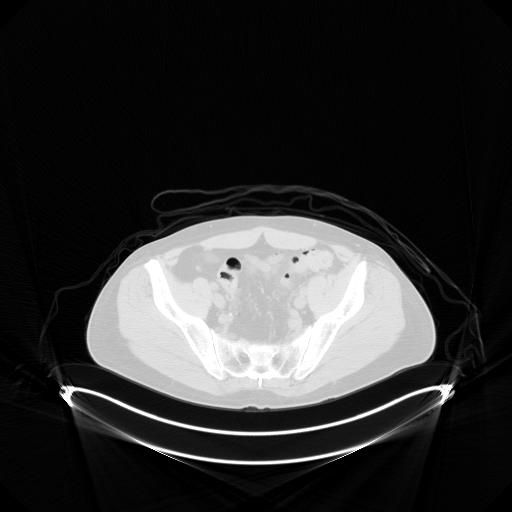
[im 122/244]
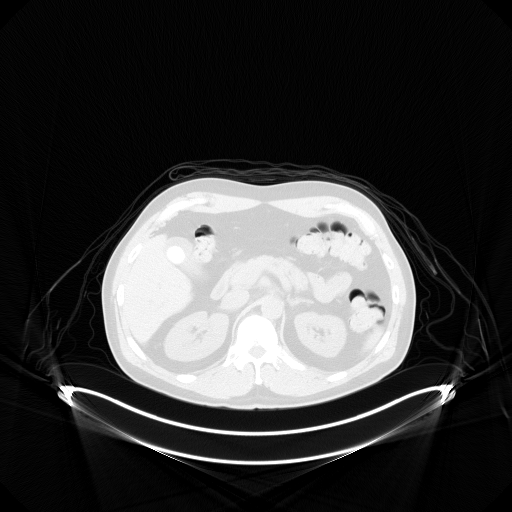
[im 183/244]
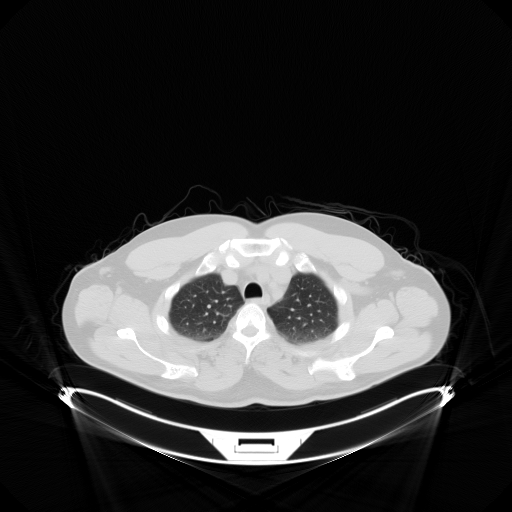
[im 244/244  brain]
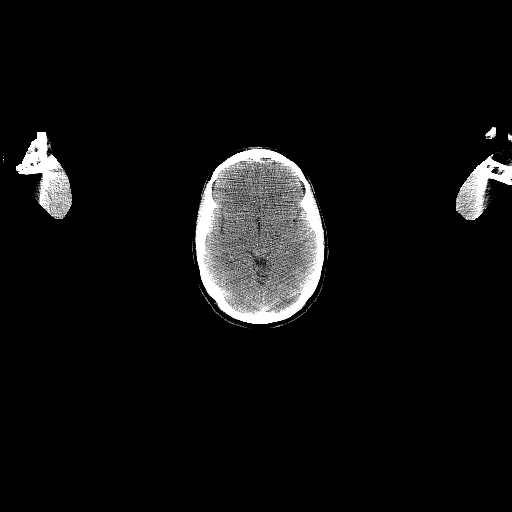

[Series 6: ct sk_thigh 5.0 b70f lung_bone · axial · 5.0mm · 0.69mm/px · 1 of 57 slices shown]
[im 1/57  bone]
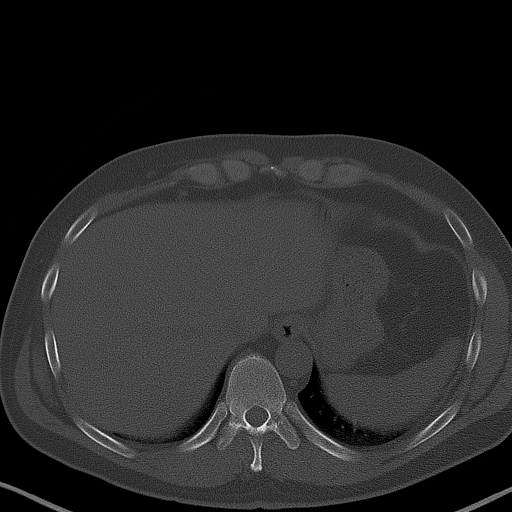

[Series 8: pet sk_thigh nac · axial · 5.0mm · 4.07mm/px · z∈[-1162,-190]mm · 5 of 244 slices shown]
[im 1/244]
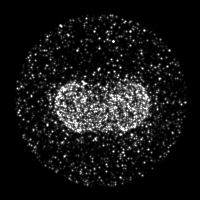
[im 61/244]
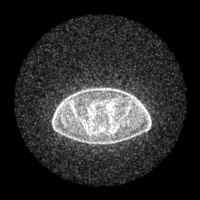
[im 122/244]
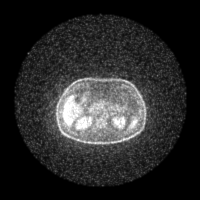
[im 183/244]
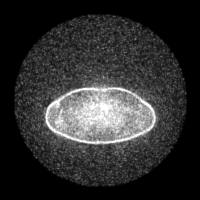
[im 244/244]
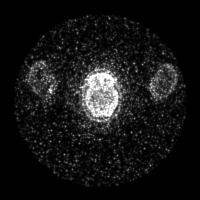

[Series 604: range-ct sk_thigh 5.0 hd_fov-cor-<alpha range> · 2 of 94 slices shown]
[im 1/94]
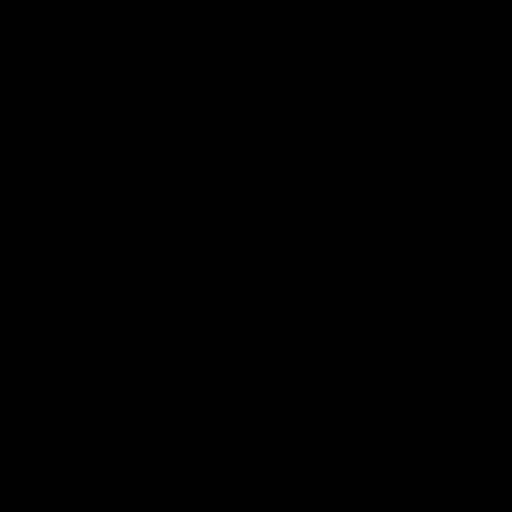
[im 94/94]
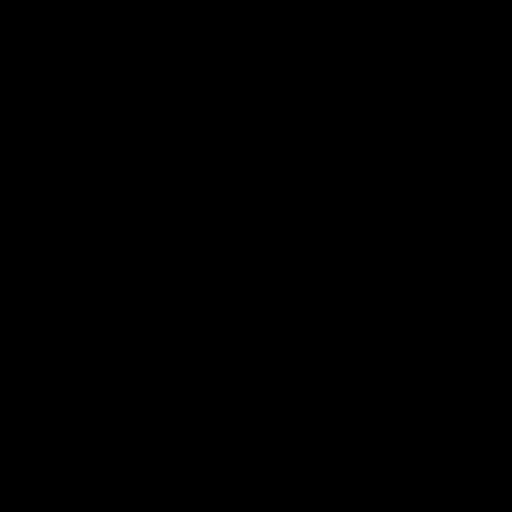

[Series 605: mip collection<mip range> · coronal · 2.02mm/px · 1 of 32 slices shown]
[im 1/32]
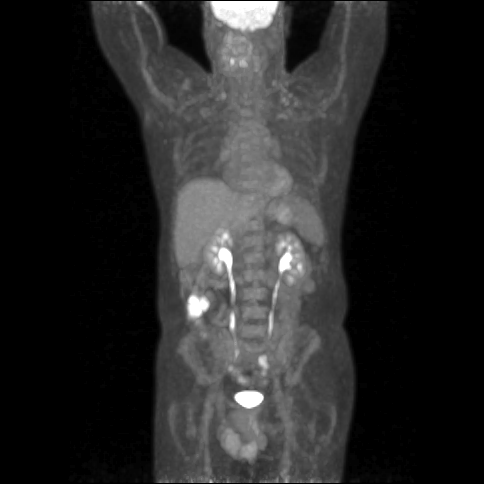

[Series 606: range-ct sk_thigh 5.0 hd_fov-tra-<alpha range> · 5 of 227 slices shown]
[im 1/227]
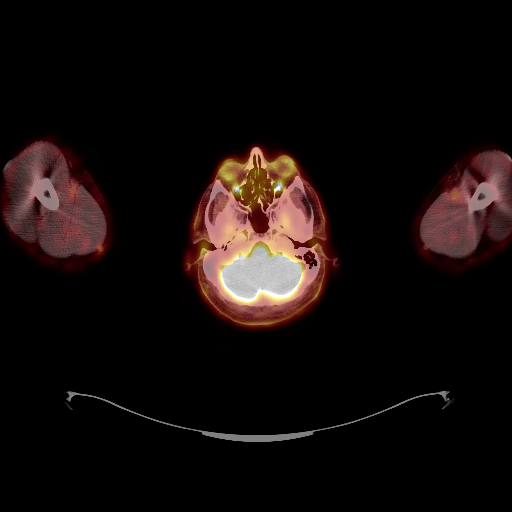
[im 57/227]
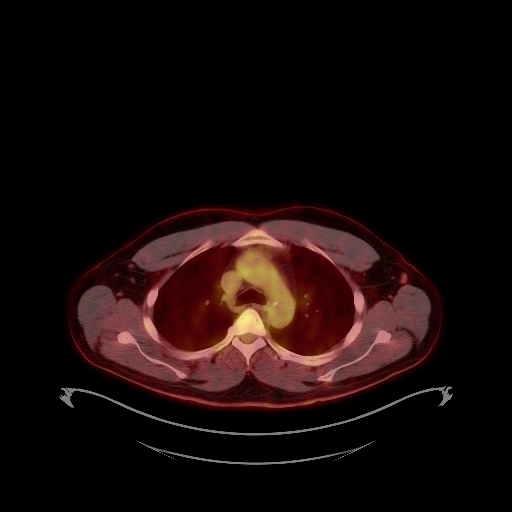
[im 114/227]
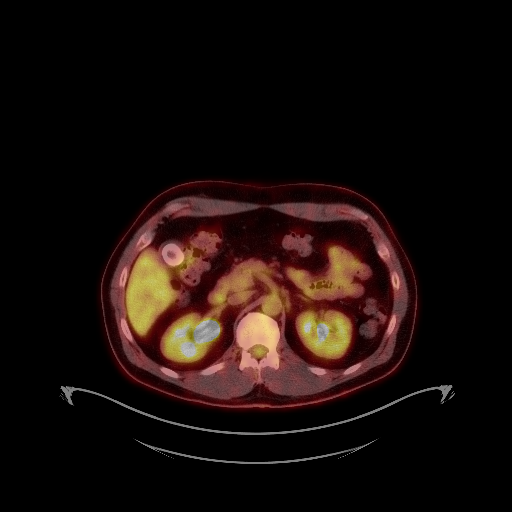
[im 170/227]
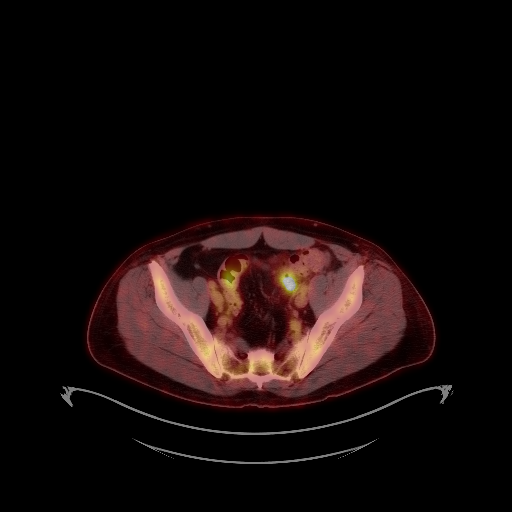
[im 227/227]
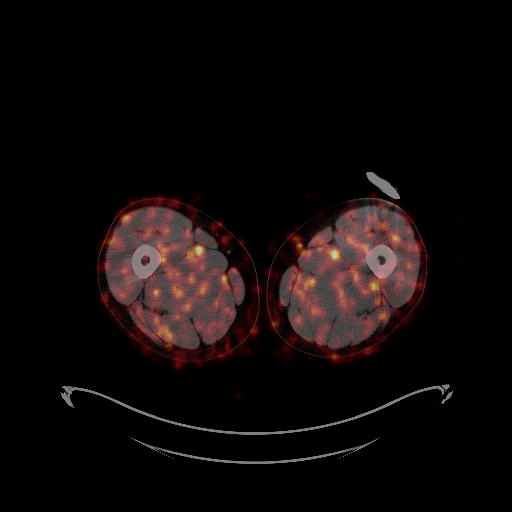

[Series 1034: results mm oncology reading · 1.04mm/px · 1 of 3 slices shown]
[im 1/3]
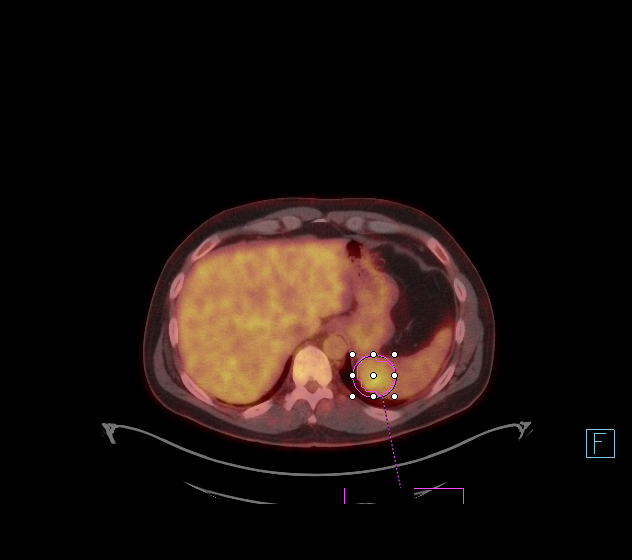

[25 of 25 positions shown; findings below may reference images not displayed]

FINDINGS: NECK

No hypermetabolic lymph nodes in the neck. CT images show no acute
findings.

CHEST

No hypermetabolic mediastinal, hilar or axillary lymph nodes. No
hypermetabolic pulmonary nodules. There is decreased attenuation of
the intravascular compartment, indicative of anemia. No pericardial
or pleural effusion.

ABDOMEN/PELVIS

Ascending colon mass (CT image 153) has an SUV max of 26.4. No
hypermetabolic lymph nodes. Minimally hyperdense lesion in the
spleen is seen medially, measuring 3.3 cm with an SUV max of 5.5. No
abnormal hypermetabolism in the liver, adrenal glands or pancreas.
CT images show the liver to be grossly unremarkable. Large stone in
the gallbladder. Adrenal glands, kidneys, spleen, pancreas, stomach
and small bowel are otherwise unremarkable. There is mild haziness
and stranding adjacent to a prominent diverticulum off the proximal
sigmoid colon (series 4, image 183) with associated hypermetabolism.
Inflammatory findings are new from 12/05/2015.

SKELETON

No abnormal osseous hypermetabolism.
IMPRESSION: 1. Hypermetabolic ascending colon mass, consistent with the given
history of colon carcinoma.
2. Mildly hypermetabolic lesion in the medial spleen remains
indeterminate. As discussed on 12/08/2015, splenic metastasis and
benign inflammatory pseudotumor can have this appearance and close
imaging surveillance with MRI abdomen without and with contrast is
recommended if tissue sampling or resection is not performed.
3. Otherwise, no evidence of metastatic disease.
4. Suspect mild sigmoid diverticulitis.
5. Cholelithiasis.

## 2017-08-16 ENCOUNTER — Telehealth: Payer: Self-pay | Admitting: *Deleted

## 2017-08-16 ENCOUNTER — Telehealth: Payer: Self-pay | Admitting: Oncology

## 2017-08-16 NOTE — Telephone Encounter (Signed)
Scheduled appt per 2/1 sch msg - left voicemail for patient regarding appts that were added.

## 2017-08-16 NOTE — Telephone Encounter (Signed)
PER MD, move pt appt 2/5 to 2/6 10am. Message to scheduling for lab 2/6 930 md 10am, called pt and lmovm with appt change information.

## 2017-08-19 ENCOUNTER — Telehealth: Payer: Self-pay | Admitting: *Deleted

## 2017-08-19 NOTE — Telephone Encounter (Signed)
Call from pt requesting to move appt to 2/12 to accommodate his work schedule. Office visit moved, message to schedulers to add lab. 2/8 appts will be canceled, per pt request.

## 2017-08-20 ENCOUNTER — Other Ambulatory Visit: Payer: BC Managed Care – PPO

## 2017-08-20 ENCOUNTER — Ambulatory Visit: Payer: BC Managed Care – PPO | Admitting: Oncology

## 2017-08-21 ENCOUNTER — Other Ambulatory Visit: Payer: BC Managed Care – PPO

## 2017-08-21 ENCOUNTER — Ambulatory Visit: Payer: BC Managed Care – PPO | Admitting: Oncology

## 2017-08-23 ENCOUNTER — Ambulatory Visit: Payer: BC Managed Care – PPO | Admitting: Oncology

## 2017-08-23 ENCOUNTER — Other Ambulatory Visit: Payer: BC Managed Care – PPO

## 2017-08-27 ENCOUNTER — Inpatient Hospital Stay: Payer: BC Managed Care – PPO | Attending: Oncology | Admitting: Oncology

## 2017-08-27 ENCOUNTER — Inpatient Hospital Stay: Payer: BC Managed Care – PPO

## 2017-08-27 ENCOUNTER — Telehealth: Payer: Self-pay | Admitting: Oncology

## 2017-08-27 ENCOUNTER — Telehealth: Payer: Self-pay | Admitting: *Deleted

## 2017-08-27 VITALS — BP 146/71 | HR 62 | Temp 98.4°F | Resp 17 | Ht 75.0 in | Wt 229.5 lb

## 2017-08-27 DIAGNOSIS — C182 Malignant neoplasm of ascending colon: Secondary | ICD-10-CM

## 2017-08-27 DIAGNOSIS — Z85038 Personal history of other malignant neoplasm of large intestine: Secondary | ICD-10-CM | POA: Diagnosis not present

## 2017-08-27 LAB — CEA (IN HOUSE-CHCC): CEA (CHCC-IN HOUSE): 2.89 ng/mL (ref 0.00–5.00)

## 2017-08-27 NOTE — Telephone Encounter (Signed)
Scheduled appt per 2/12 los - Pt aware of appts - my chart active - did not want AVS or calender printed.

## 2017-08-27 NOTE — Telephone Encounter (Signed)
-----   Message from Ladell Pier, MD sent at 08/27/2017  5:04 PM EST ----- Please call patient, CEA is normal, follow-up as scheduled

## 2017-08-27 NOTE — Telephone Encounter (Signed)
Noted pt viewed result on MyChart.

## 2017-08-27 NOTE — Progress Notes (Signed)
  [place] OFFICE PROGRESS NOTE   Diagnosis: Colon cancer  INTERVAL HISTORY:   Troy Lambert returns as scheduled.  He feels well.  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  He had one episode of blood when wiping after a bowel movement.  No symptoms of recurrent diverticulitis.  Objective:  Vital signs in last 24 hours:  Blood pressure (!) 146/71, pulse 62, temperature 98.4 F (36.9 C), temperature source Oral, resp. rate 17, height '6\' 3"'$  (1.905 m), weight 229 lb 8 oz (104.1 kg), SpO2 100 %.    HEENT: Neck without mass Lymphatics: No cervical, supraclavicular, or inguinal nodes.  Pea-sized high medial bilateral axillary nodes. Resp: Lungs clear bilaterally Cardio: Regular rate and rhythm GI: No hepatomegaly, no mass, nontender, slight soft fullness in the left inguinal canal Vascular: No leg edema  Skin: Left upper back scar without evidence of recurrent tumor  Lab Results:  Lab Results  Component Value Date   WBC 12.1 (H) 02/18/2017   HGB 16.2 02/18/2017   HCT 48.7 02/18/2017   MCV 92.4 02/18/2017   PLT 311 02/18/2017   NEUTROABS 7.6 (H) 02/18/2017     Lab Results  Component Value Date   CEA1 2.66 02/18/2017     Medications: I have reviewed the patient's current medications.   Assessment/Plan: 1. Ascending colon cancer, stage II (T3 N0), G2, status post a right colectomy 12/27/2015 ? MSI-stable, no loss of mismatch repair protein expression ? 0/32 lymph nodes involved with metastatic carcinoma ? Surveillance colonoscopy 01/29/2017, multiple polyps removed-tubular adenomas  2. 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 History ofIron deficiency anemia secondary to #1-resolved 4. Right lower extremity deep vein thrombosis confirmed on a Doppler ultrasound 01/10/2016-CompletedXarelto 5. Smokeless tobacco use 6. Diverticulitis May and June 2018 7. Melanoma removed from the left  back  Disposition: Troy Lambert is in clinical remission from colon cancer.  We will follow-up on the CEA from today.  He will return for an office visit and CEA in 6 months.  He will continue colonoscopy surveillance with Dr. Ardis Hughs. We will ask him for a copy of the pathology report from the left back melanoma.  15 minutes were spent with the patient today.  The majority of the time was used for counseling and coordination of care.  [name], MD  08/27/2017  11:47 AM

## 2017-09-02 ENCOUNTER — Telehealth: Payer: Self-pay

## 2017-09-02 NOTE — Telephone Encounter (Signed)
Spoke with pt regarding melanoma pathology report from dermatologists office. He states "they wee supposed to sdend it over to you guys. I guess no one got it". This RN will call office and attempt to obtain, pt voiced understanding. LVM with office to fax report.   Pt also provided feedback regarding his last experience in our office. His last apt date was 2/12. He was scheduled for lab at 10:00am and MD visit at 10:30. Per patient, he checked in around 9:45, and wasn't checked in until 10:32am it appears. Patient was checked out of Lab 3 at 10:45 and arrived to [facility] at 10:46am. Patient states "I didn't receive a patient survey this time and just wanted to provide respectful feedback". Patient also states "I really appreciate everything y'all do for me and people like me and everyone I meet over there is always nice". Informed pt that I will make supervisors aware. He voiced appreciation and understanding.

## 2017-10-08 ENCOUNTER — Encounter: Payer: Self-pay | Admitting: Oncology

## 2018-02-25 ENCOUNTER — Inpatient Hospital Stay: Payer: BC Managed Care – PPO

## 2018-02-25 ENCOUNTER — Telehealth: Payer: Self-pay | Admitting: Oncology

## 2018-02-25 ENCOUNTER — Inpatient Hospital Stay: Payer: BC Managed Care – PPO | Attending: Oncology | Admitting: Oncology

## 2018-02-25 VITALS — BP 144/86 | HR 66 | Temp 97.7°F | Resp 18 | Ht 75.0 in | Wt 221.6 lb

## 2018-02-25 DIAGNOSIS — Z85038 Personal history of other malignant neoplasm of large intestine: Secondary | ICD-10-CM | POA: Diagnosis not present

## 2018-02-25 DIAGNOSIS — C182 Malignant neoplasm of ascending colon: Secondary | ICD-10-CM

## 2018-02-25 NOTE — Telephone Encounter (Signed)
Scheduled appt per 8/13 los - patient aware of appt- no print out needed per patient my chart active.

## 2018-02-25 NOTE — Progress Notes (Signed)
  [place] OFFICE PROGRESS NOTE   Diagnosis: Colon cancer  INTERVAL HISTORY:   Troy Lambert returns as scheduled.  He feels well.  No symptoms of diverticulitis.  Stable left inguinal hernia.  He reports an episode of bright red blood rectum when he was constipated.  No other bleeding.  Good appetite.  He is working.  He reports intentional weight loss.  Objective:  Vital signs in last 24 hours:  Blood pressure (!) 144/86, pulse 66, temperature 97.7 F (36.5 C), temperature source Oral, resp. rate 18, height 6' 3" (1.905 m), weight 221 lb 9.6 oz (100.5 kg), SpO2 99 %.    HEENT: Neck without mass Lymphatics: No cervical, supraclavicular, axillary, or inguinal nodes Resp: Lungs clear bilaterally Cardio: Regular rate and rhythm GI: No hepatosplenomegaly, no mass, nontender, reducible left inguinal hernia Vascular: No leg edema  Skin: Left upper back scar without evidence of recurrent tumor  Lab Results:    Lab Results  Component Value Date   CEA1 2.89 08/27/2017    Medications: I have reviewed the patient's current medications.   Assessment/Plan: 1. Ascending colon cancer, stage II (T3 N0), G2, status post a right colectomy 12/27/2015 ? MSI-stable, no loss of mismatch repair protein expression ? 0/32 lymph nodes involved with metastatic carcinoma ? Surveillance colonoscopy 01/29/2017, multiple polyps removed-tubular adenomas  2. 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 History ofIron deficiency anemia secondary to #1-resolved 4. Right lower extremity deep vein thrombosis confirmed on a Doppler ultrasound 01/10/2016-CompletedXarelto 5. Smokeless tobacco use 6. Diverticulitis May and June 2018 7. Melanoma removed from the left back   Disposition: Troy Lambert appears well.  He is in remission from colon cancer.  We will follow-up on the CEA from today.  He will return for an office visit and CEA in 6  months. He continues colonoscopy surveillance with Dr. Ardis Hughs.  He is followed by dermatology after being diagnosed with melanoma.  15 minutes were spent with the patient today.  The majority of the time was used for counseling and coordination of care.  [name], MD  02/25/2018  3:35 PM

## 2018-02-26 ENCOUNTER — Encounter: Payer: Self-pay | Admitting: Oncology

## 2018-02-26 LAB — CEA (IN HOUSE-CHCC): CEA (CHCC-In House): 3.73 ng/mL (ref 0.00–5.00)

## 2018-02-26 NOTE — Telephone Encounter (Signed)
Please call him, the CEA has not changed significantly over the past 1.5 years The CEA is still in the normal range We can repeat in 3 months if he would like, follow-up as scheduled in 6 months

## 2018-02-27 ENCOUNTER — Telehealth: Payer: Self-pay

## 2018-02-27 NOTE — Telephone Encounter (Signed)
Pt has questions regarding the positive connection between colon cancer and low-dose aspirin in regards to helping. This RN will make MD aware.   Pt also voiced understanding regarding Pt message response from MD. Pt prefers to keep 65month f/u scheduled. This RN voiced understanding.

## 2018-08-28 ENCOUNTER — Inpatient Hospital Stay: Payer: BC Managed Care – PPO | Attending: Oncology | Admitting: Oncology

## 2018-08-28 ENCOUNTER — Telehealth: Payer: Self-pay

## 2018-08-28 ENCOUNTER — Inpatient Hospital Stay: Payer: BC Managed Care – PPO

## 2018-08-28 VITALS — BP 131/77 | HR 61 | Temp 98.4°F | Resp 19 | Wt 219.4 lb

## 2018-08-28 DIAGNOSIS — Z85038 Personal history of other malignant neoplasm of large intestine: Secondary | ICD-10-CM

## 2018-08-28 DIAGNOSIS — C182 Malignant neoplasm of ascending colon: Secondary | ICD-10-CM

## 2018-08-28 DIAGNOSIS — Z86718 Personal history of other venous thrombosis and embolism: Secondary | ICD-10-CM | POA: Insufficient documentation

## 2018-08-28 NOTE — Progress Notes (Signed)
  [place] OFFICE PROGRESS NOTE   Diagnosis: Colon cancer  INTERVAL HISTORY:   Mr. Eastman returns as scheduled.  He feels well.  Good appetite.  No difficulty with bowel function.  He had palpitations in December 2019.  He thinks this may be related to stress from his daughter's wedding.  He reports intermittent nausea for the past few weeks.  No other complaint.  He is working.  No symptom of recurrent venous thrombosis.  Objective:  Vital signs in last 24 hours:  Blood pressure 131/77, pulse 61, temperature 98.4 F (36.9 C), temperature source Oral, resp. rate 19, weight 219 lb 6.4 oz (99.5 kg), SpO2 100 %.    HEENT: Neck without mass Lymphatics: No cervical, supraclavicular, or inguinal nodes.  Soft mobile 1/2 cm bilateral axillary nodes versus prominent fatty tissue Resp: Lungs clear bilaterally Cardio: Regular rate and rhythm GI: No hepatosplenomegaly, no mass, nontender, no mass small left inguinal hernia Vascular: No leg edema  Skin: Left upper back scar without evidence of recurrent tumor   Lab Results:  Lab Results  Component Value Date   CEA1 3.73 02/25/2018    Medications: I have reviewed the patient's current medications.   Assessment/Plan: 1. Ascending colon cancer, stage II (T3 N0), G2, status post a right colectomy 12/27/2015 ? MSI-stable, no loss of mismatch repair protein expression ? 0/32 lymph nodes involved with metastatic carcinoma ? Surveillance colonoscopy 01/29/2017, multiple polyps removed-tubular adenomas  2. 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 History ofIron deficiency anemia secondary to #1-resolved 4. Right lower extremity deep vein thrombosis confirmed on a Doppler ultrasound 01/10/2016-CompletedXarelto 5. Smokeless tobacco use 6. Diverticulitis May and June 2018 7. Melanoma removed from the left back     Disposition: Mr. Haroon remains in clinical remission from  colon cancer.  We will follow-up on the CEA from today.  He will return for an office visit and CEA in 6 months.  He continues follow-up with dermatology for melanoma surveillance.  [name], MD  08/28/2018  3:35 PM

## 2018-08-28 NOTE — Telephone Encounter (Signed)
Patient declined avs and calender. Per 2/13 los

## 2018-08-29 LAB — CEA (IN HOUSE-CHCC): CEA (CHCC-In House): 3.03 ng/mL (ref 0.00–5.00)

## 2019-03-12 ENCOUNTER — Inpatient Hospital Stay: Payer: BC Managed Care – PPO | Admitting: *Deleted

## 2019-03-12 ENCOUNTER — Inpatient Hospital Stay: Payer: BC Managed Care – PPO | Attending: Oncology | Admitting: Oncology

## 2019-03-12 ENCOUNTER — Other Ambulatory Visit: Payer: Self-pay

## 2019-03-12 VITALS — BP 130/77 | HR 71 | Temp 98.9°F | Resp 18 | Ht 75.0 in | Wt 217.6 lb

## 2019-03-12 DIAGNOSIS — C182 Malignant neoplasm of ascending colon: Secondary | ICD-10-CM | POA: Diagnosis not present

## 2019-03-12 DIAGNOSIS — Z85038 Personal history of other malignant neoplasm of large intestine: Secondary | ICD-10-CM | POA: Diagnosis not present

## 2019-03-12 NOTE — Progress Notes (Signed)
   [place] OFFICE PROGRESS NOTE   Diagnosis: Colon cancer  INTERVAL HISTORY:   Mr. Hyslop returns as scheduled.  He feels well.  Good appetite.  He is exercising.  He has improved bowel habits since discontinuing Ambien.  He is working.  Objective:  Vital signs in last 24 hours:  Blood pressure 130/77, pulse 71, temperature 98.9 F (37.2 C), temperature source Oral, resp. rate 18, height _0  (1.905 m), weight 217 lb 9.6 oz (98.7 kg), SpO2 97 %.    HEENT: Neck without mass Lymphatics: No cervical, supraclavicular, or inguinal nodes.  Soft mobile 1/2 cm bilateral axillary nodes GI: No hepatosplenomegaly, no mass, nontender, reducible left inguinal hernia Vascular: No leg edema  Skin: Left upper back scar without evidence of recurrent tumor   Lab Results:    Lab Results  Component Value Date   CEA1 3.03 08/28/2018     Medications: I have reviewed the patient's current medications.   Assessment/Plan: 1. Ascending colon cancer, stage II (T3 N0), G2, status post a right colectomy 12/27/2015 ? MSI-stable, no loss of mismatch repair protein expression ? 0/32 lymph nodes involved with metastatic carcinoma ? Surveillance colonoscopy 01/29/2017, multiple polyps removed-tubular adenomas  2. 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 History ofIron deficiency anemia secondary to #1-resolved 4. Right lower extremity deep vein thrombosis confirmed on a Doppler ultrasound 01/10/2016-CompletedXarelto 5. Smokeless tobacco use 6. Diverticulitis May and June 2018 7. Melanoma removed from the left back    Disposition: Mr. Gasparini remains in clinical remission from colon cancer.  We will follow-up on the CEA from today.  He will return for an office visit in 1 year.  He will be due for a colonoscopy next year.  He continues follow-up with dermatology after being diagnosed with melanoma.  [name], MD  03/12/2019   3:55 PM

## 2019-03-12 NOTE — Progress Notes (Deleted)
  [place] OFFICE PROGRESS NOTE   Diagnosis:   INTERVAL HISTORY:   ***  Objective:  Vital signs in last 24 hours:  Blood pressure 130/77, pulse 71, temperature 98.9 F (37.2 C), temperature source Oral, resp. rate 18, height _0  (1.905 m), weight 217 lb 9.6 oz (98.7 kg), SpO2 97 %.    HEENT: *** Lymphatics: *** Resp: *** Cardio: *** GI: *** Vascular: *** Neuro:***  Skin:***   Portacath/PICC-without erythema  Lab Results:  Lab Results  Component Value Date   WBC 12.1 (H) 02/18/2017   HGB 16.2 02/18/2017   HCT 48.7 02/18/2017   MCV 92.4 02/18/2017   PLT 311 02/18/2017   NEUTROABS 7.6 (H) 02/18/2017    CMP  Lab Results  Component Value Date   NA 140 12/31/2016   K 4.4 12/31/2016   CL 106 12/31/2016   CO2 28 12/31/2016   GLUCOSE 100 (H) 12/31/2016   BUN 7 12/31/2016   CREATININE 1.05 12/31/2016   CALCIUM 9.2 12/31/2016   PROT 7.5 12/28/2016   ALBUMIN 3.9 12/28/2016   AST 36 12/28/2016   ALT 51 12/28/2016   ALKPHOS 64 12/28/2016   BILITOT 0.7 12/28/2016   GFRNONAA >60 12/31/2016   GFRAA >60 12/31/2016    Lab Results  Component Value Date   CEA1 3.03 08/28/2018    Lab Results  Component Value Date   INR 1.05 12/21/2015    Imaging:  No results found.  Medications: I have reviewed the patient's current medications.   Assessment/Plan: 1. Ascending colon cancer, stage II (T3 N0), G2, status post a right colectomy 12/27/2015 ? MSI-stable, no loss of mismatch repair protein expression ? 0/32 lymph nodes involved with metastatic carcinoma ? Surveillance colonoscopy 01/29/2017, multiple polyps removed-tubular adenomas  2. 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 History ofIron deficiency anemia secondary to #1-resolved 4. Right lower extremity deep vein thrombosis confirmed on a Doppler ultrasound 01/10/2016-CompletedXarelto 5. Smokeless tobacco use 6. Diverticulitis May and June  2018 7. Melanoma removed from the left back     Disposition: ***  [name], MD  03/12/2019  3:39 PM

## 2019-03-13 ENCOUNTER — Telehealth: Payer: Self-pay | Admitting: Nurse Practitioner

## 2019-03-13 LAB — CEA (IN HOUSE-CHCC): CEA (CHCC-In House): 3.36 ng/mL (ref 0.00–5.00)

## 2019-03-13 NOTE — Telephone Encounter (Signed)
Called and left msg. Mailed printout  °

## 2019-03-17 ENCOUNTER — Telehealth: Payer: Self-pay | Admitting: *Deleted

## 2019-03-17 NOTE — Progress Notes (Signed)
Awaiting phone call from pt about CEA results

## 2019-03-17 NOTE — Progress Notes (Signed)
Awaiting callback from pt about CEA results

## 2019-03-17 NOTE — Telephone Encounter (Signed)
-----   Message from Ladell Pier, MD sent at 03/13/2019 11:03 AM EDT ----- Please call patient, CEA is normal

## 2019-03-17 NOTE — Telephone Encounter (Addendum)
Per Dr. Benay Spice, called pt about normal CEA results. Vmail left for pt to return phone call.  Patient returned call and was informed of normal CEA results.

## 2020-01-07 ENCOUNTER — Encounter: Payer: Self-pay | Admitting: Gastroenterology

## 2020-01-21 ENCOUNTER — Ambulatory Visit (AMBULATORY_SURGERY_CENTER): Payer: Self-pay

## 2020-01-21 ENCOUNTER — Other Ambulatory Visit: Payer: Self-pay

## 2020-01-21 VITALS — Ht 75.0 in | Wt 227.0 lb

## 2020-01-21 DIAGNOSIS — Z85038 Personal history of other malignant neoplasm of large intestine: Secondary | ICD-10-CM

## 2020-01-21 DIAGNOSIS — Z01818 Encounter for other preprocedural examination: Secondary | ICD-10-CM

## 2020-01-21 NOTE — Progress Notes (Signed)
No egg or soy allergy known to patient  No issues with past sedation with any surgeries  or procedures, no intubation problems  No diet pills per patient No home 02 use per patient  No blood thinners per patient  Pt denies issues with constipation  No A fib or A flutter  EMMI video sent to pt's e mail  COVID 19 guidelines implemented in [facility] today   Pt instructed no gummies and no chewing tobacco the day of the procedure.  Due to the COVID-19 pandemic we are asking patients to follow these guidelines. Please only bring one care partner. Please be aware that your care partner may wait in the car in the parking lot or if they feel like they will be too hot to wait in the car, they may wait in the lobby on the 4th floor. All care partners are required to wear a mask the entire time (we do not have any that we can provide them), they need to practice social distancing, and we will do a Covid check for all patient's and care partners when you arrive. Also we will check their temperature and your temperature. If the care partner waits in their car they need to stay in the parking lot the entire time and we will call them on their cell phone when the patient is ready for discharge so they can bring the car to the front of the building. Also all patient's will need to wear a mask into building.

## 2020-01-29 ENCOUNTER — Encounter: Payer: Self-pay | Admitting: Gastroenterology

## 2020-02-10 ENCOUNTER — Other Ambulatory Visit: Payer: Self-pay | Admitting: Gastroenterology

## 2020-02-10 ENCOUNTER — Ambulatory Visit (INDEPENDENT_AMBULATORY_CARE_PROVIDER_SITE_OTHER): Payer: BC Managed Care – PPO

## 2020-02-10 DIAGNOSIS — Z1159 Encounter for screening for other viral diseases: Secondary | ICD-10-CM

## 2020-02-11 LAB — SARS CORONAVIRUS 2 (TAT 6-24 HRS): SARS Coronavirus 2: NEGATIVE

## 2020-02-12 ENCOUNTER — Other Ambulatory Visit: Payer: Self-pay

## 2020-02-12 ENCOUNTER — Encounter: Payer: Self-pay | Admitting: Gastroenterology

## 2020-02-12 ENCOUNTER — Ambulatory Visit (AMBULATORY_SURGERY_CENTER): Payer: BC Managed Care – PPO | Admitting: Gastroenterology

## 2020-02-12 VITALS — BP 120/59 | HR 66 | Temp 97.5°F | Resp 14 | Ht 75.0 in | Wt 227.0 lb

## 2020-02-12 DIAGNOSIS — D123 Benign neoplasm of transverse colon: Secondary | ICD-10-CM

## 2020-02-12 DIAGNOSIS — Z85038 Personal history of other malignant neoplasm of large intestine: Secondary | ICD-10-CM

## 2020-02-12 MED ORDER — SODIUM CHLORIDE 0.9 % IV SOLN: 500.0000 mL | INTRAVENOUS | Status: DC

## 2020-02-12 NOTE — Op Note (Signed)
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 [name]), no                            adjuvant chemo needed. Colonoscopy 01/2017 three                            adenomatous polyps removed. Medicines:                Monitored Anesthesia Care Procedure:                Pre-Anesthesia Assessment:                           - Prior to the procedure, a History and Physical                            was performed, and patient medications and                            allergies were reviewed. The patient's tolerance of                            previous anesthesia was also reviewed. The risks                            and benefits of the procedure and the sedation                            options and risks were discussed with the patient.                            All questions were answered, and informed consent                            was obtained. Prior Anticoagulants: The patient has                            taken no previous anticoagulant or antiplatelet                            agents. ASA Grade Assessment: II - A patient with  mild systemic disease. After reviewing the risks                            and benefits, the patient was deemed in                            satisfactory condition to undergo the procedure.                           After obtaining informed consent, the colonoscope                            was passed under direct vision. Throughout the                            procedure, the patient's blood pressure, pulse,  and                            oxygen saturations were monitored continuously. The                            Colonoscope was introduced through the anus and                            advanced to the the ileocolonic anastomosis. The                            colonoscopy was performed without difficulty. The                            patient tolerated the procedure well. The quality                            of the bowel preparation was good. The ileocecal                            valve, appendiceal orifice, and rectum were                            photographed. Scope In: 1:22:56 PM Scope Out: 1:37:19 PM Scope Withdrawal Time: 0 hours 9 minutes 21 seconds  Total Procedure Duration: 0 hours 14 minutes 23 seconds  Findings:                 A 4 mm polyp was found in the transverse colon. The                            polyp was sessile. The polyp was removed with a                            cold snare. Resection and retrieval were complete.                           Multiple small and large-mouthed diverticula were  found in the left colon.                           The exam was otherwise without abnormality on                            direct and retroflexion views. Complications:            No immediate complications. Estimated blood loss:                            None. Estimated Blood Loss:     Estimated blood loss was minimal. Impression:               - One 4 mm polyp in the transverse colon, removed                            with a cold snare. Resected and retrieved.                           - Diverticulosis in the left colon.                           - The examination was otherwise normal on direct                            and retroflexion views. Recommendation:           - Patient has a contact number available for                            emergencies. The signs and symptoms of potential                            delayed complications  were discussed with the                            patient. Return to normal activities tomorrow.                            Written discharge instructions were provided to the                            patient.                           - Resume previous diet.                           - Continue present medications.                           - Await pathology results. Milus Banister, MD 02/12/2020 1:41:31 PM This report has been signed electronically.

## 2020-02-12 NOTE — Progress Notes (Signed)
Vs CW I have reviewed the patient's medical history in detail and updated the computerized patient record.   

## 2020-02-12 NOTE — Progress Notes (Signed)
PT taken to PACU. Monitors in place. VSS. Report given to RN. 

## 2020-02-12 NOTE — Patient Instructions (Signed)
Handouts on polyps & diverticulosis given to you today   Await pathology on polyp removed     YOU HAD AN ENDOSCOPIC PROCEDURE TODAY AT [facility]:   Refer to the procedure report that was given to you for any specific questions about what was found during the examination.  If the procedure report does not answer your questions, please call your gastroenterologist to clarify.  If you requested that your care partner not be given the details of your procedure findings, then the procedure report has been included in a sealed envelope for you to review at your convenience later.  YOU SHOULD EXPECT: Some feelings of bloating in the abdomen. Passage of more gas than usual.  Walking can help get rid of the air that was put into your GI tract during the procedure and reduce the bloating. If you had a lower endoscopy (such as a colonoscopy or flexible sigmoidoscopy) you may notice spotting of blood in your stool or on the toilet paper. If you underwent a bowel prep for your procedure, you may not have a normal bowel movement for a few days.  Please Note:  You might notice some irritation and congestion in your nose or some drainage.  This is from the oxygen used during your procedure.  There is no need for concern and it should clear up in a day or so.  SYMPTOMS TO REPORT IMMEDIATELY:   Following lower endoscopy (colonoscopy or flexible sigmoidoscopy):  Excessive amounts of blood in the stool  Significant tenderness or worsening of abdominal pains  Swelling of the abdomen that is new, acute  Fever of 100F or higher   For urgent or emergent issues, a gastroenterologist can be reached at any hour by calling [phone]. Do not use MyChart messaging for urgent concerns.    DIET:  We do recommend a small meal at first, but then you may proceed to your regular diet.  Drink plenty of fluids but you should avoid alcoholic beverages for 24 hours.  ACTIVITY:  You should plan to  take it easy for the rest of today and you should NOT DRIVE or use heavy machinery until tomorrow (because of the sedation medicines used during the test).    FOLLOW UP: Our staff will call the number listed on your records 48-72 hours following your procedure to check on you and address any questions or concerns that you may have regarding the information given to you following your procedure. If we do not reach you, we will leave a message.  We will attempt to reach you two times.  During this call, we will ask if you have developed any symptoms of COVID 19. If you develop any symptoms (ie: fever, flu-like symptoms, shortness of breath, cough etc.) before then, please call [phone].  If you test positive for Covid 19 in the 2 weeks post procedure, please call and report this information to [place].    If any biopsies were taken you will be contacted by phone or by letter within the next 1-3 weeks.  Please call us at [phone] if you have not heard about the biopsies in 3 weeks.    SIGNATURES/CONFIDENTIALITY: You and/or your care partner have signed paperwork which will be entered into your electronic medical record.  These signatures attest to the fact that that the information above on your After Visit Summary has been reviewed and is understood.  Full responsibility of the confidentiality of this discharge information lies with you  and/or your care-partner. 

## 2020-02-12 NOTE — Progress Notes (Signed)
Called to room to assist during endoscopic procedure.  Patient ID and intended procedure confirmed with present staff. Received instructions for my participation in the procedure from the performing physician.  

## 2020-02-16 ENCOUNTER — Telehealth: Payer: Self-pay | Admitting: *Deleted

## 2020-02-16 NOTE — Telephone Encounter (Signed)
  Follow up Call-  Call back number 02/12/2020  Post procedure Call Back phone  # [phone]  Permission to leave phone message Yes  Some recent data might be hidden     Patient questions:  Do you have a fever, pain , or abdominal swelling? No. Pain Score  0 *  Have you tolerated food without any problems? Yes.    Have you been able to return to your normal activities? Yes.    Do you have any questions about your discharge instructions: Diet   No. Medications  No. Follow up visit  No.  Do you have questions or concerns about your Care? No.  Actions: * If pain score is 4 or above: No action needed, pain <4  1. Have you developed a fever since your procedure? NO  2.   Have you had an respiratory symptoms (SOB or cough) since your procedure? NO  3.   Have you tested positive for COVID 19 since your procedure NO  4.   Have you had any family members/close contacts diagnosed with the COVID 19 since your procedure?  NO   If yes to any of these questions please route to Joylene John, RN and Erenest Rasher, RN

## 2020-02-19 ENCOUNTER — Encounter: Payer: Self-pay | Admitting: Gastroenterology

## 2020-02-29 ENCOUNTER — Encounter: Payer: Self-pay | Admitting: Oncology

## 2020-03-01 ENCOUNTER — Telehealth: Payer: Self-pay | Admitting: Oncology

## 2020-03-01 NOTE — Telephone Encounter (Signed)
Scheduled appointment per 8/17 scheduling message. Patient is aware of appointment date and time.

## 2020-03-11 ENCOUNTER — Inpatient Hospital Stay: Payer: BC Managed Care – PPO

## 2020-03-11 ENCOUNTER — Encounter: Payer: Self-pay | Admitting: Nurse Practitioner

## 2020-03-11 ENCOUNTER — Other Ambulatory Visit: Payer: BC Managed Care – PPO

## 2020-03-11 ENCOUNTER — Inpatient Hospital Stay: Payer: BC Managed Care – PPO | Attending: Nurse Practitioner | Admitting: Nurse Practitioner

## 2020-03-11 ENCOUNTER — Telehealth: Payer: Self-pay

## 2020-03-11 ENCOUNTER — Other Ambulatory Visit: Payer: Self-pay

## 2020-03-11 VITALS — BP 140/88 | HR 65 | Temp 97.6°F | Resp 17 | Ht 75.0 in | Wt 228.8 lb

## 2020-03-11 DIAGNOSIS — C182 Malignant neoplasm of ascending colon: Secondary | ICD-10-CM

## 2020-03-11 DIAGNOSIS — Z85038 Personal history of other malignant neoplasm of large intestine: Secondary | ICD-10-CM | POA: Insufficient documentation

## 2020-03-11 DIAGNOSIS — Z86718 Personal history of other venous thrombosis and embolism: Secondary | ICD-10-CM | POA: Diagnosis not present

## 2020-03-11 LAB — CEA (IN HOUSE-CHCC): CEA (CHCC-In House): 3.8 ng/mL (ref 0.00–5.00)

## 2020-03-11 NOTE — Telephone Encounter (Signed)
-----   Message from Ladell Pier, MD sent at 03/11/2020 12:48 PM EDT ----- Please call patient, CEA is normal, follow-up as scheduled

## 2020-03-11 NOTE — Progress Notes (Signed)
  [facility] OFFICE PROGRESS NOTE   Diagnosis: Colon cancer  INTERVAL HISTORY:   Mr. Liska returns as scheduled.  He feels well.  No change in bowel habits.  He has occasional bleeding from hemorrhoids.  He has a good appetite and good energy level.  He is up-to-date on surveillance colonoscopy.  He continues every 44-month skin checks with dermatology.  Objective:  Vital signs in last 24 hours:  Blood pressure 140/88, pulse 65, temperature 97.6 F (36.4 C), temperature source Tympanic, resp. rate 17, height $RemoveBe'6\' 3"'fLvNPrAJD$  (1.905 m), weight 228 lb 12.8 oz (103.8 kg), SpO2 99 %.    HEENT: Neck without mass. Lymphatics: No palpable cervical, supraclavicular or inguinal lymph nodes.  Prominent biaxillary fat pads. Resp: Lungs clear bilaterally. Cardio: Regular rate and rhythm. GI: Abdomen soft and nontender.  No hepatomegaly. Vascular: No leg edema. Skin: Left upper back scar without evidence of recurrent tumor.   Lab Results:  Lab Results  Component Value Date   WBC 12.1 (H) 02/18/2017   HGB 16.2 02/18/2017   HCT 48.7 02/18/2017   MCV 92.4 02/18/2017   PLT 311 02/18/2017   NEUTROABS 7.6 (H) 02/18/2017    Imaging:  No results found.  Medications: I have reviewed the patient's current medications.  Assessment/Plan: 1. Ascending colon cancer, stage II (T3 N0), G2, status post a right colectomy 12/27/2015 ? MSI-stable, no loss of mismatch repair protein expression ? 0/32 lymph nodes involved with metastatic carcinoma ? Surveillance colonoscopy 01/29/2017, multiple polyps removed-tubular adenomas ? Colonoscopy 02/12/2020-polyp removed from the transverse colon, tubular adenoma  2. 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 History ofIron deficiency anemia secondary to #1-resolved 4. Right lower extremity deep vein thrombosis confirmed on a Doppler ultrasound 01/10/2016-CompletedXarelto 5. Smokeless tobacco  use 6. Diverticulitis May and June 2018 7. Melanoma removed from the left back  Disposition: Mr. Wilbon remains in clinical remission from colon cancer.  We will follow-up on the CEA from today.  He will continue colonoscopy surveillance with Dr. Ardis Hughs.  He continues every 64-month skin checks with dermatology.  We recommend the Covid vaccine and discussed this with him at today's visit.  He will return for a CEA and follow-up visit in 1 year.  Patient seen with Dr. Benay Spice.    Ned Card ANP/GNP-BC   03/11/2020  10:37 AM  This was a shared visit with Ned Card.  Mr. Weatherholtz is in remission from colon cancer and melanoma.  We will follow up on the CEA from today.  He will return for an office visit in 6 months.  We encouraged him to take the COVID-19 vaccine.  Julieanne Manson, MD

## 2020-03-11 NOTE — Telephone Encounter (Signed)
Message left for pt to return call for lab result update nothing alarming awaiting call back advised if pt did see results updated in my chart and had question to please give a call into office

## 2020-04-29 ENCOUNTER — Encounter: Payer: Self-pay | Admitting: Nurse Practitioner

## 2020-04-30 ENCOUNTER — Ambulatory Visit: Payer: BC Managed Care – PPO | Attending: Internal Medicine

## 2020-04-30 DIAGNOSIS — Z23 Encounter for immunization: Secondary | ICD-10-CM

## 2020-04-30 NOTE — Progress Notes (Signed)
   [id number]  Name:  Troy Lambert.    MRN: [id number] DOB: 1970/11/04  04/30/2020  [name] was observed post Covid-19 immunization for 15 minutes without incident. He was provided with Vaccine Information Sheet and instruction to access the V-Safe system.   [name] was instructed to call 911 with any severe reactions post vaccine: [name] Kitchen Difficulty breathing  . Swelling of face and throat  . A fast heartbeat  . A bad rash all over body  . Dizziness and weakness   Immunizations Administered    Name Date Dose VIS Date Route   JANSSEN COVID-19 VACCINE 04/30/2020  2:22 PM 0.5 mL 09/12/2019 Intramuscular   Manufacturer: [employer]   Lot: [id number]   [place]: [phone]

## 2020-06-15 DIAGNOSIS — I499 Cardiac arrhythmia, unspecified: Secondary | ICD-10-CM

## 2020-06-15 HISTORY — DX: Cardiac arrhythmia, unspecified: I49.9

## 2020-07-08 ENCOUNTER — Other Ambulatory Visit: Payer: Self-pay

## 2020-07-08 ENCOUNTER — Emergency Department (HOSPITAL_COMMUNITY)
Admission: EM | Admit: 2020-07-08 | Discharge: 2020-07-08 | Disposition: A | Discharge status: Home / Self Care | Payer: BC Managed Care – PPO | Attending: Emergency Medicine | Admitting: Emergency Medicine

## 2020-07-08 ENCOUNTER — Encounter (HOSPITAL_COMMUNITY): Payer: Self-pay | Admitting: Emergency Medicine

## 2020-07-08 DIAGNOSIS — Z85038 Personal history of other malignant neoplasm of large intestine: Secondary | ICD-10-CM | POA: Insufficient documentation

## 2020-07-08 DIAGNOSIS — I4891 Unspecified atrial fibrillation: Secondary | ICD-10-CM | POA: Insufficient documentation

## 2020-07-08 DIAGNOSIS — Z79899 Other long term (current) drug therapy: Secondary | ICD-10-CM | POA: Insufficient documentation

## 2020-07-08 DIAGNOSIS — Z20822 Contact with and (suspected) exposure to covid-19: Secondary | ICD-10-CM | POA: Diagnosis not present

## 2020-07-08 DIAGNOSIS — Z85828 Personal history of other malignant neoplasm of skin: Secondary | ICD-10-CM | POA: Insufficient documentation

## 2020-07-08 DIAGNOSIS — R002 Palpitations: Secondary | ICD-10-CM | POA: Diagnosis present

## 2020-07-08 LAB — BASIC METABOLIC PANEL
Anion gap: 12 (ref 5–15)
BUN: 14 mg/dL (ref 6–20)
CO2: 24 mmol/L (ref 22–32)
Calcium: 9.6 mg/dL (ref 8.9–10.3)
Chloride: 101 mmol/L (ref 98–111)
Creatinine, Ser: 1.12 mg/dL (ref 0.61–1.24)
GFR, Estimated: >60 mL/min (ref >60)
Glucose, Bld: 101 mg/dL — ABNORMAL HIGH (ref 70–99)
Potassium: 4.6 mmol/L (ref 3.5–5.1)
Sodium: 137 mmol/L (ref 135–145)

## 2020-07-08 LAB — CBC
HCT: 60 % — ABNORMAL HIGH (ref 39.0–52.0)
Hemoglobin: 20 g/dL — ABNORMAL HIGH (ref 13.0–17.0)
MCH: 32.4 pg (ref 26.0–34.0)
MCHC: 33.3 g/dL (ref 30.0–36.0)
MCV: 97.2 fL (ref 80.0–100.0)
Platelets: 293 10*3/uL (ref 150–400)
RBC: 6.17 MIL/uL — ABNORMAL HIGH (ref 4.22–5.81)
RDW: 14 % (ref 11.5–15.5)
WBC: 8.9 10*3/uL (ref 4.0–10.5)
nRBC: 0 % (ref 0.0–0.2)

## 2020-07-08 LAB — MAGNESIUM: Magnesium: 2.2 mg/dL (ref 1.7–2.4)

## 2020-07-08 LAB — POC SARS CORONAVIRUS 2 AG -  ED: SARS Coronavirus 2 Ag: NEGATIVE

## 2020-07-08 MED ORDER — DILTIAZEM HCL 25 MG/5ML IV SOLN: 10.0000 mg | Freq: Once | INTRAVENOUS | Status: DC

## 2020-07-08 MED ORDER — PROPOFOL 10 MG/ML IV BOLUS
INTRAVENOUS | Status: AC | PRN
  Administered 2020-07-08: 20 mg via INTRAVENOUS

## 2020-07-08 MED ORDER — PROPOFOL 10 MG/ML IV BOLUS
0.5000 mg/kg | Freq: Once | INTRAVENOUS | Status: AC
  Administered 2020-07-08: 11:00:00 50.4 mg via INTRAVENOUS
  Filled 2020-07-08: qty 20

## 2020-07-08 MED ORDER — PROPOFOL 10 MG/ML IV BOLUS
INTRAVENOUS | Status: AC | PRN
  Administered 2020-07-08: 30 mg via INTRAVENOUS

## 2020-07-08 MED ORDER — APIXABAN 5 MG PO TABS: 5.0000 mg | ORAL_TABLET | Freq: Two times a day (BID) | ORAL | 0 refills | Status: DC

## 2020-07-08 NOTE — Sedation Documentation (Signed)
Synchronized cardioversion at 120j

## 2020-07-08 NOTE — ED Provider Notes (Signed)
[facility] EMERGENCY DEPARTMENT Provider Note   CSN: [id number] Arrival date & time: 07/08/20  [id number]     History Chief Complaint  Patient presents with  . Atrial Fibrillation    Troy Lambert. is a 49 y.o. male.  The history is provided by the patient and medical records.  Atrial Fibrillation   Troy Lambert. is a 49 y.o. male who presents to the Emergency Department complaining of palpitations. He presents the emergency department complaining of palpitations that began at 130 this morning. He put on his Apple watch about five in the morning and it kept alarming that he was in a fib. He describes it as an irregular heartbeat feeling in his chest. He also reports nasal congestion since Monday of this week. He did have a fever on Monday night, none since then. He was seen on Tuesday and tested negative for COVID and flu. He denies any chest pain, shortness of breath, nausea, vomiting, leg swelling or pain. He does have a history of colon cancer, status post resection. He is status post splenectomy. He does have a remote history of DVT following surgery, not currently anticoagulated. He has a history of hyperlipidemia. He drinks occasional alcohol. He did take Sudafed PE last night for nasal congestion.    Past Medical History:  Diagnosis Date  . Allergy   . Anemia    iron def anemia  . Cancer ([place]) 12/2015   sqamous cell, basal cell skin cancers. Colon Cancer dx. surgery planned  . Colon cancer ([place])   . Diverticulosis    never a problem  . Melanoma ([place]) 04/2017   back of the head  . Right leg DVT ([place]) 01/10/2016    Patient Active Problem List   Diagnosis Date Noted  . Fatty liver 12/29/2016  . Diverticulitis of colon with perforation 11/29/2016  . Lesion of liver 11/29/2016  . Genetic testing 03/02/2016  . Cancer of ascending colon ([place]) 01/10/2016  . Right leg DVT ([place]) 01/10/2016  . Anemia of chronic disease 12/31/2015  . Symptomatic  cholelithiasis 12/31/2015  . Splenic mass 12/31/2015    Past Surgical History:  Procedure Laterality Date  . APPENDECTOMY  12/2015  . CHOLECYSTECTOMY N/A 12/27/2015   Procedure: LAPAROSCOPIC CHOLECYSTECTOMY WITH INTRAOPERATIVE CHOLANGIOGRAM;  Surgeon: Greer Pickerel, MD;  Location: [facility];  Service: General;  Laterality: N/A;  . COLON SURGERY     see notes  . COLONOSCOPY  01/2017  . LAPAROSCOPIC PARTIAL COLECTOMY N/A 12/27/2015   Procedure: LAPAROSCOPIC PARTIAL RIGHT COLECTOMY;  Surgeon: Greer Pickerel, MD;  Location: [facility];  Service: General;  Laterality: N/A;  . 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   Procedure:  LAPAROSCOPIC SPLENECTOMY;  Surgeon: Greer Pickerel, MD;  Location: [facility];  Service: General;  Laterality: N/A;  . TONSILLECTOMY    . VASECTOMY    . WISDOM TOOTH EXTRACTION         Family History  Problem Relation Age of Onset  . Lung cancer Father 47       mets to bone  . Prostate cancer Paternal Uncle   . Lung cancer Maternal Grandmother        smoker and worked in [employer]  . Lung cancer Maternal Grandfather   . Kidney cancer Maternal Grandfather   . Heart attack Paternal Grandfather   . Brain cancer Paternal Uncle        dx in his 23s  . Colon cancer Neg Hx   . Esophageal cancer Neg  Hx   . Pancreatic cancer Neg Hx   . Rectal cancer Neg Hx   . Stomach cancer Neg Hx   . Colon polyps Neg Hx     Social History   Tobacco Use  . Smoking status: Never Smoker  . Smokeless tobacco: Current User    Types: Chew  . Tobacco comment: pt used smokless tobacco  Vaping Use  . Vaping Use: Never used  Substance Use Topics  . Alcohol use: Yes    Alcohol/week: 3.0 standard drinks    Types: 3 Cans of beer per week    Comment: [place]  . Drug use: No    Home Medications Prior to Admission medications   Medication Sig Start Date End Date Taking? Authorizing Provider  Cholecalciferol 25 MCG (1000 UT) capsule Take 1 capsule by mouth daily.   Yes [provider]   Cyanocobalamin (VITAMIN B 12 PO) Take 5,000 mcg by mouth daily.   Yes [provider]  loratadine (CLARITIN) 10 MG tablet Take 10 mg by mouth daily as needed for allergies.    Yes [provider]  Multiple Vitamin (MULTIVITAMIN WITH MINERALS) TABS tablet Take 1 tablet by mouth daily.   Yes [provider]  OVER THE COUNTER MEDICATION Take 1 Dose by mouth daily. Amino acid supplement   Yes [provider]  Probiotic Product (PROBIOTIC-10 PO) Take 1 tablet by mouth daily.   Yes [provider]  rosuvastatin (CRESTOR) 10 MG tablet Take 10 mg by mouth every evening. 05/10/20  Yes [provider]  testosterone cypionate (DEPOTESTOSTERONE CYPIONATE) 200 MG/ML injection Inject 200 mg into the muscle once a week. 06/19/16  Yes [provider]  apixaban (ELIQUIS) 5 MG TABS tablet Take 1 tablet (5 mg total) by mouth 2 (two) times daily. 07/08/20 08/07/20  Quintella Reichert, MD    Allergies    Dilaudid [hydromorphone hcl]  Review of Systems   Review of Systems  All other systems reviewed and are negative.   Physical Exam Updated Vital Signs BP 135/87   Pulse 80   Temp 98.7 F (37.1 C) (Oral)   Resp (!) 21   Ht 6\' 3"  (1.905 m)   Wt 100.7 kg   SpO2 96%   BMI 27.75 kg/m   Physical Exam Vitals and nursing note reviewed.  Constitutional:      Appearance: He is well-developed and well-nourished.  HENT:     Head: Normocephalic and atraumatic.  Cardiovascular:     Rate and Rhythm: Normal rate. Rhythm irregular.     Heart sounds: No murmur heard.   Pulmonary:     Effort: Pulmonary effort is normal. No respiratory distress.     Breath sounds: Normal breath sounds.  Abdominal:     Palpations: Abdomen is soft.     Tenderness: There is no abdominal tenderness. There is no guarding or rebound.  Musculoskeletal:        General: No swelling, tenderness or edema.  Skin:    General: Skin is warm and dry.  Neurological:     Mental  Status: He is alert and oriented to person, place, and time.  Psychiatric:        Mood and Affect: Mood and affect normal.        Behavior: Behavior normal.     ED Results / Procedures / Treatments   Labs (all labs ordered are listed, but only abnormal results are displayed) Labs Reviewed  BASIC METABOLIC PANEL - Abnormal; Notable for the following components:  Result Value   Glucose, Bld 101 (*)    All other components within normal limits  CBC - Abnormal; Notable for the following components:   RBC 6.17 (*)    Hemoglobin 20.0 (*)    HCT 60.0 (*)    All other components within normal limits  MAGNESIUM  POC SARS CORONAVIRUS 2 AG -  ED    EKG EKG Interpretation  Date/Time:  Friday July 08 2020 11:19:09 EST Ventricular Rate:  82 PR Interval:    QRS Duration: 107 QT Interval:  351 QTC Calculation: 410 R Axis:   81 Text Interpretation: Sinus rhythm Consider right atrial enlargement RSR' in V1 or V2, probably normal variant Baseline wander in lead(s) V2 Confirmed by Quintella Reichert [phone]) on 07/08/2020 11:37:44 AM   Radiology No results found.  Procedures .Cardioversion  Date/Time: 07/08/2020 11:38 AM Performed by: Quintella Reichert, MD Authorized by: Quintella Reichert, MD   Consent:    Consent obtained:  Verbal   Consent given by:  Patient   Risks discussed:  Cutaneous burn and induced arrhythmia   Alternatives discussed:  Anti-coagulation medication Pre-procedure details:    Cardioversion basis:  Emergent   Rhythm:  Atrial fibrillation   Electrode placement:  Anterior-posterior Patient sedated: Yes. Refer to sedation procedure documentation for details of sedation.  Attempt one:    Cardioversion mode:  Synchronous   Shock (joules) attempt one: 120. Post-procedure details:    Patient status:  Awake   Patient tolerance of procedure:  Tolerated well, no immediate complications .Sedation  Date/Time: 07/08/2020 11:38 AM Performed by: Quintella Reichert,  MD Authorized by: Quintella Reichert, MD   Consent:    Consent obtained:  Verbal   Consent given by:  Patient   Risks discussed:  Allergic reaction, dysrhythmia, inadequate sedation, nausea, prolonged hypoxia resulting in organ damage, prolonged sedation necessitating reversal, respiratory compromise necessitating ventilatory assistance and intubation and vomiting   Alternatives discussed:  Analgesia without sedation, anxiolysis and regional anesthesia Universal protocol:    Procedure explained and questions answered to patient or proxy's satisfaction: yes     Relevant documents present and verified: yes     Test results available: yes     Imaging studies available: yes     Required blood products, implants, devices, and special equipment available: yes     Site/side marked: yes     Immediately prior to procedure, a time out was called: yes     Patient identity confirmed:  Verbally with patient Indications:    Procedure necessitating sedation performed by:  Physician performing sedation Pre-sedation assessment:    Time since last food or drink:  12   ASA classification: class 1 - normal, healthy patient     Mouth opening:  3 or more finger widths   Thyromental distance:  4 finger widths   Mallampati score:  I - soft palate, uvula, fauces, pillars visible   Neck mobility: normal     Pre-sedation assessments completed and reviewed: airway patency, cardiovascular function, hydration status, mental status, nausea/vomiting, pain level, respiratory function and temperature   Immediate pre-procedure details:    Reassessment: Patient reassessed immediately prior to procedure     Reviewed: vital signs, relevant labs/tests and NPO status     Verified: bag valve mask available, emergency equipment available, intubation equipment available, IV patency confirmed, oxygen available and suction available   Procedure details (see MAR for exact dosages):    Preoxygenation:  Room air   Sedation:   Propofol   Intended level of  sedation: deep   Intra-procedure monitoring:  Blood pressure monitoring, cardiac monitor, continuous pulse oximetry, frequent LOC assessments, frequent vital sign checks and continuous capnometry   Intra-procedure events: none     Total Provider sedation time (minutes):  10 Post-procedure details:    Attendance: Constant attendance by certified staff until patient recovered     Recovery: Patient returned to pre-procedure baseline     Post-sedation assessments completed and reviewed: airway patency, cardiovascular function, hydration status, mental status, nausea/vomiting, pain level, respiratory function and temperature     Patient is stable for discharge or admission: yes     Procedure completion:  Tolerated well, no immediate complications   (including critical care time)  Medications Ordered in ED Medications  propofol (DIPRIVAN) 10 mg/mL bolus/IV push (20 mg Intravenous Given 07/08/20 1115)  propofol (DIPRIVAN) 10 mg/mL bolus/IV push (30 mg Intravenous Given 07/08/20 1116)  propofol (DIPRIVAN) 10 mg/mL bolus/IV push 50.4 mg (50.4 mg Intravenous Given 07/08/20 1113)    ED Course  I have reviewed the triage vital signs and the nursing notes.  Pertinent labs & imaging results that were available during my care of the patient were reviewed by me and considered in my medical decision making (see chart for details).    MDM Rules/Calculators/A&P                         This patients CHA2DS2-VASc Score and unadjusted Ischemic Stroke Rate (% per year) is equal to 0.2 % stroke rate/year from a score of 0  Above score calculated as 1 point each if present [CHF, HTN, DM, Vascular=MI/PAD/Aortic Plaque, Age if 65-74, or Male] Above score calculated as 2 points each if present [Age > 75, or Stroke/TIA/TE]    Patient here for evaluation of palpitations, he is in rate controlled atrial fibrillation on ED presentation. Labs without significant electrolyte  abnormality. Presentation is not consistent with ACS, PE, decompensated CHF, pneumonia. Discussed with Dr. Harl Bowie, with cardiology, who recommends cardioversion if the patient is symptomatic. Discussed with patient risks and benefits of cardioversion and he is in agreement with procedure. Cardioversion performed per note.  Discussed with patient anticoagulation, cardiology follow-up.  Final Clinical Impression(s) / ED Diagnoses Final diagnoses:  New onset atrial fibrillation [facility])    Rx / DC Orders ED Discharge Orders         Ordered    Amb Referral to AFIB Clinic        07/08/20 1139    apixaban (ELIQUIS) 5 MG TABS tablet  2 times daily        07/08/20 1140           Quintella Reichert, MD 07/08/20 1206

## 2020-07-08 NOTE — Discharge Instructions (Signed)

## 2020-07-08 NOTE — Progress Notes (Signed)
Patient discussed with ER staff. Presents with palpitations, EKG shows new diagnosis of afib that is rate controlled. Despite controlled rate patient is symptomatic. From history appears patient with onset of symptoms within 48 hours. Would be reasonable to pursue electrical cardioversion followed by at least 4 weeks of anticoagulation. CHADS2Vasc score is 0, however given plans for cardioversion will still plan for 1 month of anticoag, can reassess long term anticoag at f/u. Would start eliquis 5mg  bid.    Carlyle Dolly MD

## 2020-07-08 NOTE — ED Triage Notes (Signed)
Patient arrives to ED with c/o of palpations since 130am this morning that kept him up throughout the night. Pt states his apple watch alerted him that he was in Afib. Pt states he feels "flutters" in his chest currently. No hx of Afib. HR controlled in triage.

## 2020-07-13 NOTE — Progress Notes (Signed)
Primary Care Physician: Marva Panda, NP Primary Cardiologist: none Primary Electrophysiologist: none Referring Physician: [facility]   Troy Lambert. is a 49 y.o. male with a history of colon cancer, prior DVT, and atrial fibrillation who presents for consultation in the [facility].  The patient was initially diagnosed with atrial fibrillation 07/08/20 after presenting to the ED with symptoms of palpitations. His Apple Watch showed afib with HR 130s. ECG on arrival showed rate controlled afib. He underwent DCCV at that time. Patient is on Eliquis for a CHADS2VASC score of 0 post DCCV. He reports that prior to the episode, he had drank more coffee and alcohol than usual. He had also taken pseudoephedrine for nasal decongestion. He denies significant snoring.    Today, he denies symptoms of palpitations, chest pain, shortness of breath, orthopnea, PND, lower extremity edema, dizziness, presyncope, syncope, snoring, daytime somnolence, bleeding, or neurologic sequela. The patient is tolerating medications without difficulties and is otherwise without complaint today.    Atrial Fibrillation Risk Factors:  he does not have symptoms or diagnosis of sleep apnea. he does not have a history of rheumatic fever. he does have a history of alcohol use. The patient does not have a history of early familial atrial fibrillation or other arrhythmias.  he has a BMI of Body mass index is 28.47 kg/m.[name] Kitchen Filed Weights   07/14/20 0837  Weight: 103.3 kg    Family History  Problem Relation Age of Onset  . Lung cancer Father 69       mets to bone  . Prostate cancer Paternal Uncle   . Lung cancer Maternal Grandmother        smoker and worked in [employer]  . Lung cancer Maternal Grandfather   . Kidney cancer Maternal Grandfather   . Heart attack Paternal Grandfather   . Brain cancer Paternal Uncle        dx in his 59s  . Colon cancer Neg Hx   . Esophageal  cancer Neg Hx   . Pancreatic cancer Neg Hx   . Rectal cancer Neg Hx   . Stomach cancer Neg Hx   . Colon polyps Neg Hx      Atrial Fibrillation Management history:  Previous antiarrhythmic drugs: none Previous cardioversions: 07/08/20 Previous ablations: none CHADS2VASC score: 0 Anticoagulation history: Eliquis   Past Medical History:  Diagnosis Date  . Allergy   . Anemia    iron def anemia  . Cancer ([facility]) 12/2015   sqamous cell, basal cell skin cancers. Colon Cancer dx. surgery planned  . Colon cancer ([facility])   . Diverticulosis    never a problem  . Melanoma ([facility]) 04/2017   back of the head  . Right leg DVT ([facility]) 01/10/2016   Past Surgical History:  Procedure Laterality Date  . APPENDECTOMY  12/2015  . CHOLECYSTECTOMY N/A 12/27/2015   Procedure: LAPAROSCOPIC CHOLECYSTECTOMY WITH INTRAOPERATIVE CHOLANGIOGRAM;  Surgeon: Gaynelle Adu, MD;  Location: [facility];  Service: General;  Laterality: N/A;  . COLON SURGERY     see notes  . COLONOSCOPY  01/2017  . LAPAROSCOPIC PARTIAL COLECTOMY N/A 12/27/2015   Procedure: LAPAROSCOPIC PARTIAL RIGHT COLECTOMY;  Surgeon: Gaynelle Adu, MD;  Location: [facility];  Service: General;  Laterality: N/A;  . 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   Procedure:  LAPAROSCOPIC SPLENECTOMY;  Surgeon: Gaynelle Adu, MD;  Location: [facility];  Service: General;  Laterality: N/A;  . TONSILLECTOMY    . VASECTOMY    .  WISDOM TOOTH EXTRACTION      Current Outpatient Medications  Medication Sig Dispense Refill  . apixaban (ELIQUIS) 5 MG TABS tablet Take 1 tablet (5 mg total) by mouth 2 (two) times daily. 60 tablet 0  . Cholecalciferol 25 MCG (1000 UT) capsule Take 1 capsule by mouth daily.    . Cyanocobalamin (VITAMIN B 12 PO) Take 5,000 mcg by mouth daily.    [name] Kitchen loratadine (CLARITIN) 10 MG tablet Take 10 mg by mouth daily as needed for allergies.     . Multiple Vitamin (MULTIVITAMIN WITH MINERALS) TABS tablet Take 1 tablet by mouth daily.    . Probiotic Product  (PROBIOTIC-10 PO) Take 1 tablet by mouth daily.    . rosuvastatin (CRESTOR) 10 MG tablet Take 10 mg by mouth every evening.    . testosterone cypionate (DEPOTESTOSTERONE CYPIONATE) 200 MG/ML injection Inject 200 mg into the muscle once a week.     No current facility-administered medications for this encounter.    Allergies  Allergen Reactions  . Dilaudid [Hydromorphone Hcl] Itching    Social History   Socioeconomic History  . Marital status: Married    Spouse name: [name]  . Number of children: 2  . Years of education: 65  . Highest education level: Not on file  Occupational History  . Not on file  Tobacco Use  . Smoking status: Never Smoker  . Smokeless tobacco: Current User    Types: Chew  . Tobacco comment: pt used smokless tobacco  Vaping Use  . Vaping Use: Never used  Substance and Sexual Activity  . Alcohol use: Yes    Alcohol/week: 3.0 - 4.0 standard drinks    Types: 3 - 4 Cans of beer per week  . Drug use: No  . Sexual activity: Not on file  Other Topics Concern  . Not on file  Social History Narrative   Lives with wife   Caffeine use: Drinks coffee daily or monster energy drinks   Social Determinants of Health   Financial Resource Strain: Not on file  Food Insecurity: Not on file  Transportation Needs: Not on file  Physical Activity: Not on file  Stress: Not on file  Social Connections: Not on file  Intimate Partner Violence: Not on file     ROS- All systems are reviewed and negative except as per the HPI above.  Physical Exam: Vitals:   07/14/20 0837  BP: (!) 160/90  Pulse: 77  Weight: 103.3 kg  Height: 6\' 3"  (1.905 m)    GEN- The patient is well appearing, alert and oriented x 3 today.   Head- normocephalic, atraumatic Eyes-  Sclera clear, conjunctiva pink Ears- hearing intact Oropharynx- clear Neck- supple  Lungs- Clear to ausculation bilaterally, normal work of breathing Heart- Regular rate and rhythm, no murmurs, rubs or gallops   GI- soft, NT, ND, + BS Extremities- no clubbing, cyanosis, or edema MS- no significant deformity or atrophy Skin- no rash or lesion Psych- euthymic mood, full affect Neuro- strength and sensation are intact  Wt Readings from Last 3 Encounters:  07/14/20 103.3 kg  07/08/20 100.7 kg  03/11/20 103.8 kg    EKG today demonstrates SR Vent. rate 77 BPM PR interval 168 ms QRS duration 104 ms QT/QTc 348/393 ms  Epic records are reviewed at length today  CHA2DS2-VASc Score = 0  The patient's score is based upon: CHF History: No HTN History: No Diabetes History: No Stroke History: No Vascular Disease History: No Age Score: 0 Gender Score:  0      ASSESSMENT AND PLAN: 1. Paroxysmal Atrial Fibrillation (ICD10:  I48.0) The patient's CHA2DS2-VASc score is 0, indicating a 0.2% annual risk of stroke.   General education about afib provided and questions answered. We also discussed his stroke risk and the risks and benefits of anticoagulation. Will check echocardiogram Will continue Eliquis 5 mg BID for 4 weeks post DCCV. Anticipate discontinuing at that time with his low CV score. Lifestyle modification was discussed and encouraged including alcohol reduction.   2. Elevated BP No prior diagnosis of HTN. Likely anxious about today's visit. Will continue to monitor. Patient has BP machine at home for monitoring.    Follow up in the AF clinic in one month.    [facility] [address] [place], [place] [address] [phone] 07/14/2020 8:45 AM

## 2020-07-14 ENCOUNTER — Other Ambulatory Visit: Payer: Self-pay

## 2020-07-14 ENCOUNTER — Ambulatory Visit (HOSPITAL_BASED_OUTPATIENT_CLINIC_OR_DEPARTMENT_OTHER)
Admission: RE | Admit: 2020-07-14 | Discharge: 2020-07-14 | Disposition: A | Discharge status: Home / Self Care | Payer: BC Managed Care – PPO | Source: Ambulatory Visit | Attending: Physician Assistant | Admitting: Physician Assistant

## 2020-07-14 ENCOUNTER — Ambulatory Visit (HOSPITAL_COMMUNITY)
Admission: RE | Admit: 2020-07-14 | Discharge: 2020-07-14 | Disposition: A | Discharge status: Home / Self Care | Payer: BC Managed Care – PPO | Source: Ambulatory Visit | Attending: Physician Assistant | Admitting: Physician Assistant

## 2020-07-14 ENCOUNTER — Encounter (HOSPITAL_COMMUNITY): Payer: Self-pay | Admitting: Physician Assistant

## 2020-07-14 VITALS — BP 160/90 | HR 77 | Ht 75.0 in | Wt 227.8 lb

## 2020-07-14 DIAGNOSIS — I480 Paroxysmal atrial fibrillation: Secondary | ICD-10-CM | POA: Insufficient documentation

## 2020-07-14 LAB — ECHOCARDIOGRAM COMPLETE
Area-P 1/2: 3.03 cm2
Height: 75 in
Weight: 3644.8 oz

## 2020-07-14 MED ORDER — APIXABAN 5 MG PO TABS: 5.0000 mg | ORAL_TABLET | Freq: Two times a day (BID) | ORAL | 3 refills | Status: DC

## 2020-07-14 NOTE — Progress Notes (Signed)
  Echocardiogram 2D Echocardiogram has been performed.  Troy Lambert 07/14/2020, 12:04 PM

## 2020-07-16 DIAGNOSIS — U071 COVID-19: Secondary | ICD-10-CM

## 2020-07-16 HISTORY — DX: COVID-19: U07.1

## 2020-07-18 ENCOUNTER — Encounter (HOSPITAL_COMMUNITY): Payer: Self-pay | Admitting: *Deleted

## 2020-08-03 ENCOUNTER — Other Ambulatory Visit (HOSPITAL_COMMUNITY): Payer: Self-pay | Admitting: Physician Assistant

## 2020-08-11 ENCOUNTER — Ambulatory Visit (HOSPITAL_COMMUNITY): Payer: BC Managed Care – PPO | Admitting: Physician Assistant

## 2020-10-14 DIAGNOSIS — J189 Pneumonia, unspecified organism: Secondary | ICD-10-CM

## 2020-10-14 HISTORY — DX: Pneumonia, unspecified organism: J18.9

## 2021-01-02 ENCOUNTER — Encounter: Payer: Self-pay | Admitting: Oncology

## 2021-01-09 ENCOUNTER — Inpatient Hospital Stay: Payer: BC Managed Care – PPO

## 2021-01-09 ENCOUNTER — Other Ambulatory Visit: Payer: BC Managed Care – PPO

## 2021-01-09 ENCOUNTER — Other Ambulatory Visit: Payer: Self-pay

## 2021-01-09 ENCOUNTER — Inpatient Hospital Stay: Payer: BC Managed Care – PPO | Attending: Oncology | Admitting: Oncology

## 2021-01-09 ENCOUNTER — Other Ambulatory Visit: Payer: Self-pay | Admitting: *Deleted

## 2021-01-09 VITALS — BP 145/89 | HR 75 | Temp 98.0°F | Resp 18 | Ht 75.0 in | Wt 225.0 lb

## 2021-01-09 DIAGNOSIS — Z85038 Personal history of other malignant neoplasm of large intestine: Secondary | ICD-10-CM | POA: Insufficient documentation

## 2021-01-09 DIAGNOSIS — C182 Malignant neoplasm of ascending colon: Secondary | ICD-10-CM | POA: Diagnosis not present

## 2021-01-09 DIAGNOSIS — I4891 Unspecified atrial fibrillation: Secondary | ICD-10-CM | POA: Diagnosis not present

## 2021-01-09 DIAGNOSIS — Z86718 Personal history of other venous thrombosis and embolism: Secondary | ICD-10-CM | POA: Diagnosis not present

## 2021-01-09 DIAGNOSIS — Z85820 Personal history of malignant melanoma of skin: Secondary | ICD-10-CM | POA: Insufficient documentation

## 2021-01-09 LAB — CBC WITH DIFFERENTIAL (CANCER CENTER ONLY)
Abs Immature Granulocytes: 0.1 10*3/uL — ABNORMAL HIGH (ref 0.00–0.07)
Basophils Absolute: 0.1 10*3/uL (ref 0.0–0.1)
Basophils Relative: 1 %
Eosinophils Absolute: 0.1 10*3/uL (ref 0.0–0.5)
Eosinophils Relative: 1 %
HCT: 56.3 % — ABNORMAL HIGH (ref 39.0–52.0)
Hemoglobin: 18.7 g/dL — ABNORMAL HIGH (ref 13.0–17.0)
Immature Granulocytes: 1 %
Lymphocytes Relative: 25 %
Lymphs Abs: 2.2 10*3/uL (ref 0.7–4.0)
MCH: 32.2 pg (ref 26.0–34.0)
MCHC: 33.2 g/dL (ref 30.0–36.0)
MCV: 96.9 fL (ref 80.0–100.0)
Monocytes Absolute: 0.9 10*3/uL (ref 0.1–1.0)
Monocytes Relative: 10 %
Neutro Abs: 5.6 10*3/uL (ref 1.7–7.7)
Neutrophils Relative %: 62 %
Platelet Count: 303 10*3/uL (ref 150–400)
RBC: 5.81 MIL/uL (ref 4.22–5.81)
RDW: 15.3 % (ref 11.5–15.5)
WBC Count: 9 10*3/uL (ref 4.0–10.5)
nRBC: 0 % (ref 0.0–0.2)

## 2021-01-09 LAB — CEA (ACCESS): CEA (CHCC): 3.17 ng/mL (ref 0.00–5.00)

## 2021-01-09 LAB — CEA (IN HOUSE-CHCC): CEA (CHCC-In House): 4.37 ng/mL (ref 0.00–5.00)

## 2021-01-09 NOTE — Progress Notes (Signed)
  [place] OFFICE PROGRESS NOTE   Diagnosis: Colon cancer, melanoma  INTERVAL HISTORY:   Troy Lambert returns as scheduled.  He feels well.  No difficulty with bowel function.  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  He is being followed by orthopedics.  He reports numbness in the left foot.  Back pain and the foot have improved with steroid injections.  Objective:  Vital signs in last 24 hours:  Blood pressure (!) 145/89, pulse 75, temperature 98 F (36.7 C), resp. rate 18, height $RemoveBe'6\' 3"'[id number]$  (1.905 m), weight 225 lb (102.1 kg), SpO2 96 %.  Lymphatics: No cervical, supraclavicular, axillary, or inguinal nodes Resp: Lungs clear bilaterally Cardio: Regular rate and rhythm GI: No hepatosplenomegaly, no mass, nontender, soft left inguinal hernia Vascular: No leg edema Neuro: The motor exam appears intact in the lower extremities bilaterally Skin: Left upper back scar without evidence of recurrent tumor   Lab Results:  Lab Results  Component Value Date   WBC 8.9 07/08/2020   HGB 20.0 (H) 07/08/2020   HCT 60.0 (H) 07/08/2020   MCV 97.2 07/08/2020   PLT 293 07/08/2020   NEUTROABS 7.6 (H) 02/18/2017    CMP  Lab Results  Component Value Date   NA 137 07/08/2020   K 4.6 07/08/2020   CL 101 07/08/2020   CO2 24 07/08/2020   GLUCOSE 101 (H) 07/08/2020   BUN 14 07/08/2020   CREATININE 1.12 07/08/2020   CALCIUM 9.6 07/08/2020   PROT 7.5 12/28/2016   ALBUMIN 3.9 12/28/2016   AST 36 12/28/2016   ALT 51 12/28/2016   ALKPHOS 64 12/28/2016   BILITOT 0.7 12/28/2016   GFRNONAA >60 07/08/2020   GFRAA >60 12/31/2016    Lab Results  Component Value Date   CEA1 3.80 03/11/2020     Medications: I have reviewed the patient's current medications.   Assessment/Plan: Ascending colon cancer, stage II (T3 N0), G2, status post a right colectomy 12/27/2015 MSI-stable, no loss of mismatch repair protein expression 0/32 lymph nodes involved with metastatic carcinoma Surveillance  colonoscopy 01/29/2017, multiple polyps removed-tubular adenomas Colonoscopy 02/12/2020-polyp removed from the transverse colon, tubular adenoma    Splenic mass-status post a splenectomy 12/27/2015-benign vascular proliferation History of Iron deficiency anemia secondary to #1-resolved Right lower extremity deep vein thrombosis confirmed on a Doppler ultrasound 01/10/2016-Completed Xarelto Smokeless tobacco use Diverticulitis May and June 2018 Melanoma removed from the left back    Disposition: Troy Lambert is in clinical remission from colon cancer.  He will continue colonoscopy follow-up with Dr. Ardis Hughs.  He reports he is followed by dermatology every 6 months.  Troy Lambert is now 5 years out from the colon cancer diagnosis.  He will be discharged from the oncology clinic.  I am available to see him in the future as needed.  [name], MD  01/09/2021  8:43 AM  Addendum: We noted his hemoglobin was elevated when he was seen in the emergency room on 07/08/2020 with atrial fibrillation.  We will repeat a CBC as he leaves the office today.  We will initiate additional evaluation as indicated.

## 2021-01-09 NOTE — Progress Notes (Signed)
Lab orders entered [facility]

## 2021-01-12 ENCOUNTER — Telehealth: Payer: Self-pay

## 2021-01-12 NOTE — Telephone Encounter (Signed)
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 Why?

## 2021-01-12 NOTE — Telephone Encounter (Signed)
TC to Pt to inform him that his CEA results were normal Informed Dr Benay Spice would like him to follow up with his PCP or he can schedule an appointment with him to manage his HGB level. Pt stated he has a physical coming up with PCP and will follow up then. Pt also was asked who started him on testosterone? He stated his PCP and the reason was because his levels were low for his age.

## 2021-01-13 ENCOUNTER — Encounter: Payer: Self-pay | Admitting: Oncology

## 2021-01-19 ENCOUNTER — Other Ambulatory Visit: Payer: Self-pay | Admitting: Orthopedic Surgery

## 2021-02-06 NOTE — Progress Notes (Signed)
Surgical Instructions   Your procedure is scheduled on Wednesday February 15, 2021.  Report to [facility] Main Entrance "A" at 09:00 A.M., then check in with the Admitting office.  Call [phone] if you have problems or questions between now and the morning of surgery:   Remember: Do not eat after midnight the night before your surgery  You may drink clear liquids until 09:00 the morning of your surgery.   Clear liquids allowed are: Water, Non-Citrus Juices (without pulp), Carbonated Beverages, Clear Tea, Black Coffee Only, and Gatorade   Enhanced Recovery after Surgery for Orthopedics Enhanced Recovery after Surgery is a protocol used to improve the stress on your body and your recovery after surgery.  Patient Instructions  The day of surgery (if you do NOT have diabetes):  Drink ONE (1) Pre-Surgery Clear Ensure by 09:00 am the morning of surgery   This drink was given to you during your hospital  pre-op appointment visit. Nothing else to drink after completing the  Pre-Surgery Clear Ensure.         If you have questions, please contact your surgeon's office.    Take these medicines the morning of surgery with A SIP OF WATER  Loratidine (Claritin)   As of today, STOP taking any Aspirin (unless otherwise instructed by your surgeon) or Aspirin-containing products; NSAIDS - Aleve, Naproxen, Ibuprofen, Motrin, Advil, Goody's, BC's, all herbal medications, fish oil, and all vitamins.           Do not wear jewelry  Do not wear lotions, powders, colognes, or deodorant. Men may shave face and neck. Do not bring valuables to the hospital - [facility] is not responsible for any belongings or valuables.              Do NOT Smoke (Tobacco/Vaping) or drink Alcohol 24 hours prior to your procedure  If you use a CPAP at night, you may bring all equipment for your overnight stay.   Contacts, glasses, hearing aids, dentures or partials may not be worn into surgery, please bring cases  for these belongings   For patients admitted to the hospital, discharge time will be determined by your treatment team.   Patients discharged the day of surgery will not be allowed to drive home, and someone needs to stay with them for 24 hours.  ONLY ONE (1) SUPPORT PERSON MAY WAIT IN THE WAITING AREA WHILE YOU ARE IN SURGERY. NO VISITORS WILL BE ALLOWED IN PRE-OP WHERE PATIENTS GET READY FOR SURGERY.  TWO (2) VISITORS WILL BE ALLOWED IN YOUR ROOM IF YOU ARE ADMITTED AFTER SURGERY.  Minor children may have two parents present. Special consideration for safety and communication needs will be reviewed on a case by case basis.  Special instructions:    Oral Hygiene is also important to reduce your risk of infection.  Remember - BRUSH YOUR TEETH THE MORNING OF SURGERY WITH YOUR REGULAR TOOTHPASTE   - Preparing For Surgery  Before surgery, you can play an important role. Because skin is not sterile, your skin needs to be as free of germs as possible. You can reduce the number of germs on your skin by washing with CHG (chlorahexidine gluconate) Soap before surgery.  CHG is an antiseptic cleaner which kills germs and bonds with the skin to continue killing germs even after washing.     Please do not use if you have an allergy to CHG or antibacterial soaps. If your skin becomes reddened/irritated stop using the CHG.  Do not shave (including legs and underarms) for at least 48 hours prior to first CHG shower. It is OK to shave your face.  Please follow these instructions carefully.     Shower the NIGHT BEFORE SURGERY and the MORNING OF SURGERY with CHG Soap.   If you chose to wash your hair, wash your hair first as usual with your normal shampoo. After you shampoo, rinse your hair and body thoroughly to remove the shampoo.    Then [employer] and genitals (private parts) with your normal soap and rinse thoroughly to remove soap.  Next use the CHG Soap as you would any other liquid  soap. You can apply CHG directly to the skin and wash gently with a clean washcloth.   Apply the CHG Soap to your body ONLY FROM THE NECK DOWN.  Do not use on open wounds or open sores. Avoid contact with your eyes, ears, mouth and genitals (private parts). Wash Face and genitals (private parts)  with your normal soap.   Wash thoroughly, paying special attention to the area where your surgery will be performed.  Thoroughly rinse your body with warm water from the neck down.  DO NOT shower/wash with your normal soap after using and rinsing off the CHG Soap.  Pat yourself dry with a CLEAN TOWEL.  Wear CLEAN PAJAMAS to bed the night before surgery  Place CLEAN SHEETS on your bed the night before your surgery  DO NOT SLEEP WITH PETS.   Day of Surgery:  Take a shower with CHG soap. Wear Clean/Comfortable clothing the morning of surgery Do not apply any deodorants/lotions.   Remember to brush your teeth WITH YOUR REGULAR TOOTHPASTE.   Please read over the following fact sheets that you were given.

## 2021-02-07 ENCOUNTER — Other Ambulatory Visit: Payer: Self-pay

## 2021-02-07 ENCOUNTER — Encounter: Payer: Self-pay | Admitting: Oncology

## 2021-02-07 ENCOUNTER — Encounter (HOSPITAL_COMMUNITY)
Admission: RE | Admit: 2021-02-07 | Discharge: 2021-02-07 | Disposition: A | Discharge status: Home / Self Care | Payer: BC Managed Care – PPO | Source: Ambulatory Visit | Attending: Orthopedic Surgery | Admitting: Orthopedic Surgery

## 2021-02-07 ENCOUNTER — Encounter (HOSPITAL_COMMUNITY): Payer: Self-pay

## 2021-02-07 DIAGNOSIS — Z01812 Encounter for preprocedural laboratory examination: Secondary | ICD-10-CM | POA: Diagnosis not present

## 2021-02-07 HISTORY — DX: Other specified postprocedural states: Z98.890

## 2021-02-07 HISTORY — DX: Other complications of anesthesia, initial encounter: T88.59XA

## 2021-02-07 HISTORY — DX: Nausea with vomiting, unspecified: R11.2

## 2021-02-07 HISTORY — DX: Essential (primary) hypertension: I10

## 2021-02-07 LAB — COMPREHENSIVE METABOLIC PANEL
ALT: 56 U/L — ABNORMAL HIGH (ref 0–44)
AST: 44 U/L — ABNORMAL HIGH (ref 15–41)
Albumin: 4.6 g/dL (ref 3.5–5.0)
Alkaline Phosphatase: 45 U/L (ref 38–126)
Anion gap: 7 (ref 5–15)
BUN: 12 mg/dL (ref 6–20)
CO2: 23 mmol/L (ref 22–32)
Calcium: 9.7 mg/dL (ref 8.9–10.3)
Chloride: 107 mmol/L (ref 98–111)
Creatinine, Ser: 0.89 mg/dL (ref 0.61–1.24)
GFR, Estimated: >60 mL/min (ref >60)
Glucose, Bld: 104 mg/dL — ABNORMAL HIGH (ref 70–99)
Potassium: 4.1 mmol/L (ref 3.5–5.1)
Sodium: 137 mmol/L (ref 135–145)
Total Bilirubin: 0.6 mg/dL (ref 0.3–1.2)
Total Protein: 7.5 g/dL (ref 6.5–8.1)

## 2021-02-07 LAB — CBC WITH DIFFERENTIAL/PLATELET
Abs Immature Granulocytes: 0.04 10*3/uL (ref 0.00–0.07)
Basophils Absolute: 0.1 10*3/uL (ref 0.0–0.1)
Basophils Relative: 1 %
Eosinophils Absolute: 0.1 10*3/uL (ref 0.0–0.5)
Eosinophils Relative: 2 %
HCT: 53.9 % — ABNORMAL HIGH (ref 39.0–52.0)
Hemoglobin: 18.5 g/dL — ABNORMAL HIGH (ref 13.0–17.0)
Immature Granulocytes: 1 %
Lymphocytes Relative: 27 %
Lymphs Abs: 2.1 10*3/uL (ref 0.7–4.0)
MCH: 32.6 pg (ref 26.0–34.0)
MCHC: 34.3 g/dL (ref 30.0–36.0)
MCV: 94.9 fL (ref 80.0–100.0)
Monocytes Absolute: 0.8 10*3/uL (ref 0.1–1.0)
Monocytes Relative: 10 %
Neutro Abs: 4.5 10*3/uL (ref 1.7–7.7)
Neutrophils Relative %: 59 %
Platelets: 325 10*3/uL (ref 150–400)
RBC: 5.68 MIL/uL (ref 4.22–5.81)
RDW: 13.2 % (ref 11.5–15.5)
WBC: 7.7 10*3/uL (ref 4.0–10.5)
nRBC: 0 % (ref 0.0–0.2)

## 2021-02-07 LAB — URINALYSIS, ROUTINE W REFLEX MICROSCOPIC
Bilirubin Urine: NEGATIVE
Glucose, UA: NEGATIVE mg/dL
Hgb urine dipstick: NEGATIVE
Ketones, ur: NEGATIVE mg/dL
Leukocytes,Ua: NEGATIVE
Nitrite: NEGATIVE
Protein, ur: NEGATIVE mg/dL
Specific Gravity, Urine: 1.003 — ABNORMAL LOW (ref 1.005–1.030)
pH: 6 (ref 5.0–8.0)

## 2021-02-07 LAB — TYPE AND SCREEN
ABO/RH(D): A POS
Antibody Screen: NEGATIVE

## 2021-02-07 LAB — SURGICAL PCR SCREEN
MRSA, PCR: NEGATIVE
Staphylococcus aureus: NEGATIVE

## 2021-02-07 LAB — PROTIME-INR
INR: 1 (ref 0.8–1.2)
Prothrombin Time: 13.5 seconds (ref 11.4–15.2)

## 2021-02-07 LAB — APTT: aPTT: 27 seconds (ref 24–36)

## 2021-02-07 NOTE — Progress Notes (Addendum)
PCP - Everardo Beals, NP Cardiologist - Denies  Chest x-ray - Not indicated EKG - 07/14/20 Stress Test - Denies ECHO - 07-14-20 Cardiac Cath - Denies  Sleep Study - Denies  DM - Denies  Aspirin Instructions: Aspirin on hold for surgery per Dr. Lynann Bologna  ERAS Protcol - Yes PRE-SURGERY Ensure    COVID TEST- Not indicated  Anesthesia review: Yes history of afib this year d/t infection. Underwent cardioversion per patient.   Patient denies shortness of breath, fever, cough and chest pain at PAT appointment   All instructions explained to the patient, with a verbal understanding of the material. Patient agrees to go over the instructions while at home for a better understanding.  The opportunity to ask questions was provided.

## 2021-02-08 ENCOUNTER — Encounter (HOSPITAL_COMMUNITY): Payer: Self-pay

## 2021-02-08 NOTE — Progress Notes (Signed)
Anesthesia Chart Review:  Case: [id number] Date/Time: 02/15/21 1145   Procedure: LEFT-SIDED LUMBAR 5 - SACRUM 1 MICRODISECTOMY (Left)   Anesthesia type: General   Pre-op diagnosis: LEFT SACRUM 1 RADICULOPATHY   Location: [facility] OR ROOM 05 / [place] OR   Surgeons: Phylliss Bob, MD       DISCUSSION: Patient is a 50 year old male scheduled for the above procedure.  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 He reported COVID-19 earlier this year.  3-4 beers per week as documented.  Last visit with oncologist Dr. Benay Spice was on 01/09/2021.  He remained in clinical remission from colon cancer.  Last colonoscopy 02/12/2020 with polypectomy from transverse colon showing tubular adenoma with no high-grade dysplasia or carcinoma.  Since he was now 5 years out from colon cancer diagnosis, he was discharged from the oncology clinic with follow-up as needed.  He will need continued follow-up by dermatology and GI.  He did recheck a CBC given H/H 20/60 on 07/08/20, and H/H were down to 18.7/56.3 which is similar to results from 02/07/21 (H/H 18.5/53.9).   Aspirin is currently on hold for surgery.  He is no longer on Eliquis.  Last EKG on 07/14/2020 showed sinus rhythm.  07/14/2020 echo showed normal biventricular function with no significant valvular disease.  Meds include an ACE inhibitor and statin. He  denied shortness of breath, chest pain, cough, fever at PAT RN visit.  Anesthesia team to evaluate on the day of surgery.   VS: BP 140/88   Pulse 74   Temp 36.9 C (Oral)   Resp 18   Ht '6\' 3"'$  (1.905 m)   Wt 103 kg   SpO2 98%   BMI 28.37 kg/m    PROVIDERS: Everardo Beals, NP is PCP  Ladell Pier., MD is Noah Charon, MD is GI  - Evaluated by Carlyle Dolly, MD on 07/08/20 in ED at time of afib episode. Had follow-up visit in the [facility] on 07/14/20 by Malka So, PA.  A. fib episode was in the setting of recent URI with use of pseudoephedrine and reported more coffee and alcohol than usual prior to the episode. His Apple Watch had showed he was in afib with a rate up to 130's. While in the ED his HR was controlled, but remained symptomatic with palpitations. Cardiologist Dr. Harl Bowie recommended cardioversion and Eliquis for one month CHADS2VASC score of 0 post DCCV. Echo ordered which showed normal BiV function with no significant valvular disease. Patient did not go back for one month follow-up. He did follow through with primary care follow-up for elevated BP (160/90).    LABS: Labs reviewed: Acceptable for surgery. H/H are consistent with previous results.  (all labs ordered are listed, but only abnormal results are displayed)  Labs Reviewed  CBC WITH DIFFERENTIAL/PLATELET - Abnormal; Notable for the following components:      Result Value   Hemoglobin 18.5 (*)    HCT 53.9 (*)    All other components within normal limits  COMPREHENSIVE METABOLIC PANEL - Abnormal; Notable for the following  components:   Glucose, Bld 104 (*)    AST 44 (*)    ALT 56 (*)    All other components within normal limits  URINALYSIS, ROUTINE W REFLEX MICROSCOPIC - Abnormal; Notable for the following components:   Color, Urine STRAW (*)    Specific Gravity, Urine 1.003 (*)    All other components within normal limits  SURGICAL PCR SCREEN  PROTIME-INR  APTT  TYPE AND SCREEN      IMAGES: MRI L-spine 11/24/20 (report in [facility]/PACS) IMPRESSION: 1. L5-S1 left paracentral extrusion compressing the left S1 nerve root, recurrent/progressed from 2017. 2. L4-5 progressive disc degeneration with progressed bilateral subarticular recess narrowing.   EKG: 07/14/20: NSR   CV: Echo 07/14/20: IMPRESSIONS   1. Left ventricular ejection fraction, by estimation, is 55 to 60%. The  left ventricle has normal function. The left ventricle has no regional  wall motion abnormalities. Left ventricular diastolic parameters were  normal.   2. Right ventricular systolic function is normal. The right ventricular  size is normal. Tricuspid regurgitation signal is inadequate for assessing  PA pressure.   3. The mitral valve is grossly normal. Trivial mitral valve  regurgitation.   4. The aortic valve is tricuspid. Aortic valve regurgitation is not  visualized. No aortic stenosis is present.   5. The inferior vena cava is dilated in size with >50% respiratory  variability, suggesting right atrial pressure of 8 mmHg.   Comparison(s): No prior Echocardiogram.   Conclusion(s)/Recommendation(s): Normal biventricular function without  evidence of hemodynamically significant valvular heart disease.    Past Medical History:  Diagnosis Date   Allergy    Anemia    iron def anemia   Cancer ([place]) 12/2015   sqamous cell, basal cell skin cancers. Colon Cancer dx. surgery planned   Colon cancer ([place])    Complication of anesthesia    COVID 2022   Diverticulosis    never a problem   Dysrhythmia 06/2020   A fib   Hypertension    Melanoma ([place]) 04/2017   back of the head   Pneumonia 10/2020   Bacterial   PONV (postoperative nausea and vomiting)    Right leg DVT ([place]) 01/10/2016    Past Surgical History:  Procedure Laterality Date   APPENDECTOMY  12/2015   CHOLECYSTECTOMY N/A 12/27/2015   Procedure: LAPAROSCOPIC CHOLECYSTECTOMY WITH INTRAOPERATIVE CHOLANGIOGRAM;  Surgeon:  Greer Pickerel, MD;  Location: [facility] ORS;  Service: General;  Laterality: N/A;   COLON SURGERY     see notes   COLONOSCOPY  01/2017   DIAGNOSTIC LAPAROSCOPY     LAPAROSCOPIC PARTIAL COLECTOMY N/A 12/27/2015   Procedure: LAPAROSCOPIC PARTIAL RIGHT COLECTOMY;  Surgeon: Greer Pickerel, MD;  Location: [facility] ORS;  Service: General;  Laterality: N/A;   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   Procedure:  LAPAROSCOPIC SPLENECTOMY;  Surgeon: Greer Pickerel, MD;  Location: [facility] ORS;  Service: General;  Laterality: N/A;   TONSILLECTOMY     VASECTOMY     WISDOM TOOTH EXTRACTION      MEDICATIONS:  psyllium (HYDROCIL/METAMUCIL) 95 % PACK   apixaban (ELIQUIS) 5 MG TABS tablet   Cholecalciferol 25 MCG (1000 UT) capsule   Cyanocobalamin (VITAMIN B 12 PO)   lisinopril (ZESTRIL) 10 MG tablet   loratadine (CLARITIN) 10 MG tablet   montelukast (SINGULAIR) 10 MG tablet   Multiple Vitamin (MULTIVITAMIN WITH MINERALS) TABS tablet   rosuvastatin (CRESTOR) 10 MG tablet   testosterone cypionate (DEPOTESTOSTERONE CYPIONATE) 200 MG/ML injection   zolpidem (AMBIEN) 10 MG tablet  No current facility-administered medications for this encounter.    Myra Gianotti, PA-C Surgical Short Stay/Anesthesiology [facility] Phone [phone] [facility] Phone [phone] 02/08/2021 5:39 PM

## 2021-02-08 NOTE — Anesthesia Preprocedure Evaluation (Addendum)
Anesthesia Evaluation  Patient identified by MRN, date of birth, ID band Patient awake    Reviewed: Allergy & Precautions, NPO status , Patient's Chart, lab work & pertinent test results  History of Anesthesia Complications Negative for: history of anesthetic complications  Airway Mallampati: II  TM Distance: >3 FB Neck ROM: Full    Dental no notable dental hx. (+) Teeth Intact, Dental Advisory Given   Pulmonary neg pulmonary ROS,    Pulmonary exam normal breath sounds clear to auscultation       Cardiovascular hypertension, Pt. on medications + DVT (2017)  Normal cardiovascular exam+ dysrhythmias (s/p DCCV and eliquis for 4 weeks) Atrial Fibrillation  Rhythm:Regular Rate:Normal  Echo 07/14/20: IMPRESSIONS  1. Left ventricular ejection fraction, by estimation, is 55 to 60%. The  left ventricle has normal function. The left ventricle has no regional  wall motion abnormalities. Left ventricular diastolic parameters were  normal.  2. Right ventricular systolic function is normal. The right ventricular  size is normal. Tricuspid regurgitation signal is inadequate for assessing  PA pressure.  3. The mitral valve is grossly normal. Trivial mitral valve  regurgitation.  4. The aortic valve is tricuspid. Aortic valve regurgitation is not  visualized. No aortic stenosis is present.  5. The inferior vena cava is dilated in size with >50% respiratory  variability, suggesting right atrial pressure of 8 mmHg.   Neuro/Psych negative neurological ROS  negative psych ROS   GI/Hepatic negative GI ROS, Neg liver ROS,   Endo/Other  negative endocrine ROS  Renal/GU negative Renal ROS  negative genitourinary   Musculoskeletal Left S1 radiculopathy    Abdominal   Peds  Hematology negative hematology ROS (+) hct 53.9   Anesthesia Other Findings   Reproductive/Obstetrics negative OB ROS                            Anesthesia Physical Anesthesia Plan  ASA: 2  Anesthesia Plan: General   Post-op Pain Management:    Induction: Intravenous  PONV Risk Score and Plan: 4 or greater and Ondansetron, Dexamethasone, Midazolam, Scopolamine patch - Pre-op, Aprepitant and Treatment may vary due to age or medical condition  Airway Management Planned: Oral ETT  Additional Equipment: None  Intra-op Plan:   Post-operative Plan: Extubation in OR  Informed Consent: I have reviewed the patients History and Physical, chart, labs and discussed the procedure including the risks, benefits and alternatives for the proposed anesthesia with the patient or authorized representative who has indicated his/her understanding and acceptance.     Dental advisory given  Plan Discussed with: CRNA  Anesthesia Plan Comments:       Anesthesia Quick Evaluation

## 2021-02-14 NOTE — Progress Notes (Signed)
Unable to get in touch with patient or his wife. Left messages for both patient and his wife.  Patient's daughter did answer the phone and said she will inform patient of new arrival time of 0830 as well as new ERAS time of 0830.

## 2021-02-15 ENCOUNTER — Ambulatory Visit (HOSPITAL_COMMUNITY): Payer: BC Managed Care – PPO

## 2021-02-15 ENCOUNTER — Encounter (HOSPITAL_COMMUNITY)
Admission: RE | Disposition: A | Discharge status: Home / Self Care | Payer: Self-pay | Source: Home / Self Care | Attending: Orthopedic Surgery

## 2021-02-15 ENCOUNTER — Ambulatory Visit (HOSPITAL_COMMUNITY)
Admission: RE | Admit: 2021-02-15 | Discharge: 2021-02-15 | Disposition: A | Discharge status: Home / Self Care | Payer: BC Managed Care – PPO | Attending: Orthopedic Surgery | Admitting: Orthopedic Surgery

## 2021-02-15 ENCOUNTER — Ambulatory Visit (HOSPITAL_COMMUNITY): Payer: BC Managed Care – PPO | Admitting: Vascular Surgery

## 2021-02-15 ENCOUNTER — Ambulatory Visit (HOSPITAL_COMMUNITY): Payer: BC Managed Care – PPO | Admitting: Registered Nurse

## 2021-02-15 ENCOUNTER — Other Ambulatory Visit: Payer: Self-pay

## 2021-02-15 ENCOUNTER — Encounter (HOSPITAL_COMMUNITY): Payer: Self-pay | Admitting: Orthopedic Surgery

## 2021-02-15 DIAGNOSIS — Z8249 Family history of ischemic heart disease and other diseases of the circulatory system: Secondary | ICD-10-CM | POA: Diagnosis not present

## 2021-02-15 DIAGNOSIS — Z885 Allergy status to narcotic agent status: Secondary | ICD-10-CM | POA: Diagnosis not present

## 2021-02-15 DIAGNOSIS — F17220 Nicotine dependence, chewing tobacco, uncomplicated: Secondary | ICD-10-CM | POA: Diagnosis not present

## 2021-02-15 DIAGNOSIS — Z79899 Other long term (current) drug therapy: Secondary | ICD-10-CM | POA: Insufficient documentation

## 2021-02-15 DIAGNOSIS — Z8042 Family history of malignant neoplasm of prostate: Secondary | ICD-10-CM | POA: Insufficient documentation

## 2021-02-15 DIAGNOSIS — Z7901 Long term (current) use of anticoagulants: Secondary | ICD-10-CM | POA: Diagnosis not present

## 2021-02-15 DIAGNOSIS — Z8616 Personal history of COVID-19: Secondary | ICD-10-CM | POA: Diagnosis not present

## 2021-02-15 DIAGNOSIS — Z8051 Family history of malignant neoplasm of kidney: Secondary | ICD-10-CM | POA: Insufficient documentation

## 2021-02-15 DIAGNOSIS — Z86718 Personal history of other venous thrombosis and embolism: Secondary | ICD-10-CM | POA: Insufficient documentation

## 2021-02-15 DIAGNOSIS — Z419 Encounter for procedure for purposes other than remedying health state, unspecified: Secondary | ICD-10-CM

## 2021-02-15 DIAGNOSIS — Z801 Family history of malignant neoplasm of trachea, bronchus and lung: Secondary | ICD-10-CM | POA: Insufficient documentation

## 2021-02-15 DIAGNOSIS — M5117 Intervertebral disc disorders with radiculopathy, lumbosacral region: Secondary | ICD-10-CM | POA: Diagnosis not present

## 2021-02-15 DIAGNOSIS — Z808 Family history of malignant neoplasm of other organs or systems: Secondary | ICD-10-CM | POA: Diagnosis not present

## 2021-02-15 HISTORY — PX: LUMBAR LAMINECTOMY/DECOMPRESSION MICRODISCECTOMY: SHX5026

## 2021-02-15 SURGERY — LUMBAR LAMINECTOMY/DECOMPRESSION MICRODISCECTOMY
Anesthesia: General | Site: Back | Laterality: Left

## 2021-02-15 MED ORDER — ROCURONIUM BROMIDE 10 MG/ML (PF) SYRINGE
PREFILLED_SYRINGE | INTRAVENOUS | Status: DC | PRN
  Administered 2021-02-15: 100 mg via INTRAVENOUS

## 2021-02-15 MED ORDER — PROPOFOL 10 MG/ML IV BOLUS
INTRAVENOUS | Status: DC | PRN
  Administered 2021-02-15: 200 mg via INTRAVENOUS

## 2021-02-15 MED ORDER — ORAL CARE MOUTH RINSE: 15.0000 mL | Freq: Once | OROMUCOSAL | Status: AC

## 2021-02-15 MED ORDER — OXYCODONE HCL 5 MG PO TABS
5.0000 mg | ORAL_TABLET | Freq: Once | ORAL | Status: AC | PRN
  Administered 2021-02-15: 5 mg via ORAL

## 2021-02-15 MED ORDER — PROPOFOL 10 MG/ML IV BOLUS
INTRAVENOUS | Status: AC
  Filled 2021-02-15: qty 20

## 2021-02-15 MED ORDER — PROPOFOL 500 MG/50ML IV EMUL
INTRAVENOUS | Status: DC | PRN
  Administered 2021-02-15: 25 ug/kg/min via INTRAVENOUS

## 2021-02-15 MED ORDER — BUPIVACAINE-EPINEPHRINE 0.25% -1:200000 IJ SOLN
INTRAMUSCULAR | Status: DC | PRN
  Administered 2021-02-15: 8 mL
  Administered 2021-02-15: 22 mL

## 2021-02-15 MED ORDER — SUGAMMADEX SODIUM 200 MG/2ML IV SOLN
INTRAVENOUS | Status: DC | PRN
  Administered 2021-02-15 (×2): 100 mg via INTRAVENOUS

## 2021-02-15 MED ORDER — CEFAZOLIN SODIUM-DEXTROSE 2-4 GM/100ML-% IV SOLN
INTRAVENOUS | Status: AC
  Filled 2021-02-15: qty 100

## 2021-02-15 MED ORDER — CEFAZOLIN SODIUM-DEXTROSE 2-4 GM/100ML-% IV SOLN
2.0000 g | INTRAVENOUS | Status: AC
  Administered 2021-02-15: 2 g via INTRAVENOUS

## 2021-02-15 MED ORDER — OXYCODONE HCL 5 MG PO TABS
ORAL_TABLET | ORAL | Status: AC
  Filled 2021-02-15: qty 1

## 2021-02-15 MED ORDER — PROMETHAZINE HCL 25 MG/ML IJ SOLN: 6.2500 mg | INTRAMUSCULAR | Status: DC | PRN

## 2021-02-15 MED ORDER — BUPIVACAINE LIPOSOME 1.3 % IJ SUSP
INTRAMUSCULAR | Status: DC | PRN
  Administered 2021-02-15: 20 mL

## 2021-02-15 MED ORDER — EPHEDRINE SULFATE-NACL 50-0.9 MG/10ML-% IV SOSY
PREFILLED_SYRINGE | INTRAVENOUS | Status: DC | PRN
  Administered 2021-02-15: 5 mg via INTRAVENOUS
  Administered 2021-02-15 (×2): 10 mg via INTRAVENOUS

## 2021-02-15 MED ORDER — BUPIVACAINE-EPINEPHRINE (PF) 0.25% -1:200000 IJ SOLN
INTRAMUSCULAR | Status: AC
  Filled 2021-02-15: qty 30

## 2021-02-15 MED ORDER — CHLORHEXIDINE GLUCONATE 0.12 % MT SOLN
OROMUCOSAL | Status: AC
  Administered 2021-02-15: 15 mL
  Filled 2021-02-15: qty 15

## 2021-02-15 MED ORDER — LIDOCAINE 2% (20 MG/ML) 5 ML SYRINGE
INTRAMUSCULAR | Status: DC | PRN
  Administered 2021-02-15: 60 mg via INTRAVENOUS

## 2021-02-15 MED ORDER — MEPERIDINE HCL 25 MG/ML IJ SOLN: 6.2500 mg | INTRAMUSCULAR | Status: DC | PRN

## 2021-02-15 MED ORDER — MIDAZOLAM HCL 2 MG/2ML IJ SOLN
INTRAMUSCULAR | Status: AC
  Filled 2021-02-15: qty 2

## 2021-02-15 MED ORDER — FENTANYL CITRATE (PF) 250 MCG/5ML IJ SOLN
INTRAMUSCULAR | Status: AC
  Filled 2021-02-15: qty 5

## 2021-02-15 MED ORDER — ACETAMINOPHEN 500 MG PO TABS
1000.0000 mg | ORAL_TABLET | Freq: Once | ORAL | Status: AC
  Administered 2021-02-15: 1000 mg via ORAL
  Filled 2021-02-15: qty 2

## 2021-02-15 MED ORDER — THROMBIN 20000 UNITS EX SOLR
CUTANEOUS | Status: AC
  Filled 2021-02-15: qty 20000

## 2021-02-15 MED ORDER — SCOPOLAMINE 1 MG/3DAYS TD PT72
1.0000 | MEDICATED_PATCH | TRANSDERMAL | Status: DC
  Administered 2021-02-15: 1.5 mg via TRANSDERMAL
  Filled 2021-02-15: qty 1

## 2021-02-15 MED ORDER — AMISULPRIDE (ANTIEMETIC) 5 MG/2ML IV SOLN: 10.0000 mg | Freq: Once | INTRAVENOUS | Status: DC | PRN

## 2021-02-15 MED ORDER — HEMOSTATIC AGENTS (NO CHARGE) OPTIME
TOPICAL | Status: DC | PRN
  Administered 2021-02-15: 1 via TOPICAL

## 2021-02-15 MED ORDER — KETOROLAC TROMETHAMINE 30 MG/ML IJ SOLN
INTRAMUSCULAR | Status: AC
  Filled 2021-02-15: qty 1

## 2021-02-15 MED ORDER — FENTANYL CITRATE (PF) 100 MCG/2ML IJ SOLN
INTRAMUSCULAR | Status: AC
  Filled 2021-02-15: qty 2

## 2021-02-15 MED ORDER — 0.9 % SODIUM CHLORIDE (POUR BTL) OPTIME
TOPICAL | Status: DC | PRN
  Administered 2021-02-15: 1000 mL

## 2021-02-15 MED ORDER — FENTANYL CITRATE (PF) 250 MCG/5ML IJ SOLN
INTRAMUSCULAR | Status: DC | PRN
  Administered 2021-02-15: 100 ug via INTRAVENOUS

## 2021-02-15 MED ORDER — BUPIVACAINE LIPOSOME 1.3 % IJ SUSP
INTRAMUSCULAR | Status: AC
  Filled 2021-02-15: qty 20

## 2021-02-15 MED ORDER — POVIDONE-IODINE 7.5 % EX SOLN: Freq: Once | CUTANEOUS | Status: DC

## 2021-02-15 MED ORDER — METHYLPREDNISOLONE ACETATE 40 MG/ML IJ SUSP
INTRAMUSCULAR | Status: AC
  Filled 2021-02-15: qty 1

## 2021-02-15 MED ORDER — METHOCARBAMOL 500 MG PO TABS: 500.0000 mg | ORAL_TABLET | Freq: Four times a day (QID) | ORAL | 0 refills | Status: AC | PRN

## 2021-02-15 MED ORDER — OXYCODONE HCL 5 MG/5ML PO SOLN: 5.0000 mg | Freq: Once | ORAL | Status: AC | PRN

## 2021-02-15 MED ORDER — LACTATED RINGERS IV SOLN: INTRAVENOUS | Status: DC

## 2021-02-15 MED ORDER — CHLORHEXIDINE GLUCONATE 0.12 % MT SOLN: 15.0000 mL | Freq: Once | OROMUCOSAL | Status: AC

## 2021-02-15 MED ORDER — FENTANYL CITRATE (PF) 100 MCG/2ML IJ SOLN
25.0000 ug | INTRAMUSCULAR | Status: DC | PRN
  Administered 2021-02-15 (×2): 50 ug via INTRAVENOUS

## 2021-02-15 MED ORDER — METHYLENE BLUE 0.5 % INJ SOLN
INTRAVENOUS | Status: AC
  Filled 2021-02-15: qty 10

## 2021-02-15 MED ORDER — APREPITANT 40 MG PO CAPS
40.0000 mg | ORAL_CAPSULE | Freq: Once | ORAL | Status: AC
  Administered 2021-02-15: 40 mg via ORAL
  Filled 2021-02-15: qty 1

## 2021-02-15 MED ORDER — METHYLENE BLUE 0.5 % INJ SOLN
INTRAVENOUS | Status: DC | PRN
  Administered 2021-02-15: 1 mL via INTRADERMAL

## 2021-02-15 MED ORDER — THROMBIN 20000 UNITS EX SOLR: CUTANEOUS | Status: DC | PRN

## 2021-02-15 MED ORDER — HYDROCODONE-ACETAMINOPHEN 5-325 MG PO TABS: 1.0000 | ORAL_TABLET | Freq: Four times a day (QID) | ORAL | 0 refills | Status: AC | PRN

## 2021-02-15 MED ORDER — ONDANSETRON HCL 4 MG/2ML IJ SOLN
INTRAMUSCULAR | Status: DC | PRN
  Administered 2021-02-15: 4 mg via INTRAVENOUS

## 2021-02-15 MED ORDER — KETOROLAC TROMETHAMINE 30 MG/ML IJ SOLN
30.0000 mg | Freq: Once | INTRAMUSCULAR | Status: AC | PRN
  Administered 2021-02-15: 30 mg via INTRAVENOUS

## 2021-02-15 MED ORDER — MIDAZOLAM HCL 2 MG/2ML IJ SOLN
INTRAMUSCULAR | Status: DC | PRN
  Administered 2021-02-15: 2 mg via INTRAVENOUS

## 2021-02-15 MED ORDER — PHENYLEPHRINE 40 MCG/ML (10ML) SYRINGE FOR IV PUSH (FOR BLOOD PRESSURE SUPPORT)
PREFILLED_SYRINGE | INTRAVENOUS | Status: DC | PRN
  Administered 2021-02-15 (×2): 80 ug via INTRAVENOUS

## 2021-02-15 SURGICAL SUPPLY — 68 items
BAG COUNTER SPONGE SURGICOUNT (BAG) ×2 IMPLANT
BENZOIN TINCTURE PRP APPL 2/3 (GAUZE/BANDAGES/DRESSINGS) ×2 IMPLANT
BNDG GAUZE ELAST 4 BULKY (GAUZE/BANDAGES/DRESSINGS) IMPLANT
BUR ROUND PRECISION 4.0 (BURR) ×2 IMPLANT
CABLE BIPOLOR RESECTION CORD (MISCELLANEOUS) IMPLANT
CANISTER SUCT 3000ML PPV (MISCELLANEOUS) ×2 IMPLANT
CARTRIDGE OIL MAESTRO DRILL (MISCELLANEOUS) ×1 IMPLANT
COVER SURGICAL LIGHT HANDLE (MISCELLANEOUS) IMPLANT
DIFFUSER DRILL AIR PNEUMATIC (MISCELLANEOUS) ×2 IMPLANT
DRAIN CHANNEL 15F RND FF W/TCR (WOUND CARE) IMPLANT
DRAPE POUCH INSTRU U-SHP 10X18 (DRAPES) IMPLANT
DRAPE SURG 17X23 STRL (DRAPES) ×8 IMPLANT
DURAPREP 26ML APPLICATOR (WOUND CARE) ×2 IMPLANT
ELECT BLADE 4.0 EZ CLEAN MEGAD (MISCELLANEOUS) ×2
ELECT CAUTERY BLADE 6.4 (BLADE) ×2 IMPLANT
ELECT REM PT RETURN 9FT ADLT (ELECTROSURGICAL) ×2
ELECTRODE BLDE 4.0 EZ CLN MEGD (MISCELLANEOUS) ×1 IMPLANT
ELECTRODE REM PT RTRN 9FT ADLT (ELECTROSURGICAL) ×1 IMPLANT
EVACUATOR SILICONE 100CC (DRAIN) IMPLANT
FILTER STRAW FLUID ASPIR (MISCELLANEOUS) ×4 IMPLANT
GAUZE 4X4 16PLY ~~LOC~~+RFID DBL (SPONGE) ×2 IMPLANT
GAUZE SPONGE 4X4 12PLY STRL (GAUZE/BANDAGES/DRESSINGS) ×2 IMPLANT
GAUZE SPONGE 4X4 12PLY STRL LF (GAUZE/BANDAGES/DRESSINGS) ×2 IMPLANT
GLOVE SRG 8 PF TXTR STRL LF DI (GLOVE) ×3 IMPLANT
GLOVE SURG ENC MOIS LTX SZ7 (GLOVE) ×4 IMPLANT
GLOVE SURG ENC MOIS LTX SZ8 (GLOVE) ×2 IMPLANT
GLOVE SURG UNDER POLY LF SZ7 (GLOVE) ×2 IMPLANT
GLOVE SURG UNDER POLY LF SZ8 (GLOVE) ×3
GOWN STRL REUS W/ TWL LRG LVL3 (GOWN DISPOSABLE) ×1 IMPLANT
GOWN STRL REUS W/ TWL XL LVL3 (GOWN DISPOSABLE) ×2 IMPLANT
GOWN STRL REUS W/TWL LRG LVL3 (GOWN DISPOSABLE) ×2
GOWN STRL REUS W/TWL XL LVL3 (GOWN DISPOSABLE) ×2
IV CATH 14GX2 1/4 (CATHETERS) ×2 IMPLANT
KIT BASIN OR (CUSTOM PROCEDURE TRAY) ×2 IMPLANT
KIT POSITION SURG JACKSON T1 (MISCELLANEOUS) ×2 IMPLANT
KIT TURNOVER KIT B (KITS) ×2 IMPLANT
MARKER SKIN DUAL TIP RULER LAB (MISCELLANEOUS) ×2 IMPLANT
NEEDLE 18GX1X1/2 (RX/OR ONLY) (NEEDLE) ×2 IMPLANT
NEEDLE 22X1 1/2 (OR ONLY) (NEEDLE) ×2 IMPLANT
NEEDLE HYPO 25GX1X1/2 BEV (NEEDLE) ×2 IMPLANT
NEEDLE SPNL 18GX3.5 QUINCKE PK (NEEDLE) ×4 IMPLANT
NS IRRIG 1000ML POUR BTL (IV SOLUTION) ×2 IMPLANT
OIL CARTRIDGE MAESTRO DRILL (MISCELLANEOUS) ×2
PACK LAMINECTOMY ORTHO (CUSTOM PROCEDURE TRAY) ×2 IMPLANT
PACK UNIVERSAL I (CUSTOM PROCEDURE TRAY) ×2 IMPLANT
PAD ARMBOARD 7.5X6 YLW CONV (MISCELLANEOUS) ×4 IMPLANT
PATTIES SURGICAL .5 X.5 (GAUZE/BANDAGES/DRESSINGS) IMPLANT
PATTIES SURGICAL .5 X1 (DISPOSABLE) ×2 IMPLANT
SPONGE INTESTINAL PEANUT (DISPOSABLE) ×2 IMPLANT
SPONGE SURGIFOAM ABS GEL SZ50 (HEMOSTASIS) ×2 IMPLANT
STRIP CLOSURE SKIN 1/2X4 (GAUZE/BANDAGES/DRESSINGS) ×2 IMPLANT
SURGIFLO W/THROMBIN 8M KIT (HEMOSTASIS) ×2 IMPLANT
SUT MNCRL AB 4-0 PS2 18 (SUTURE) ×2 IMPLANT
SUT VIC AB 0 CT1 18XCR BRD 8 (SUTURE) IMPLANT
SUT VIC AB 0 CT1 8-18 (SUTURE)
SUT VIC AB 1 CT1 18XCR BRD 8 (SUTURE) ×1 IMPLANT
SUT VIC AB 1 CT1 8-18 (SUTURE) ×2
SUT VIC AB 2-0 CT2 18 VCP726D (SUTURE) ×2 IMPLANT
SYR 20ML LL LF (SYRINGE) ×2 IMPLANT
SYR BULB IRRIG 60ML STRL (SYRINGE) ×2 IMPLANT
SYR CONTROL 10ML LL (SYRINGE) ×4 IMPLANT
SYR TB 1ML 25GX5/8 (SYRINGE) ×4 IMPLANT
SYR TB 1ML LUER SLIP (SYRINGE) ×4 IMPLANT
TAPE CLOTH SURG 4X10 WHT LF (GAUZE/BANDAGES/DRESSINGS) IMPLANT
TOWEL GREEN STERILE (TOWEL DISPOSABLE) ×2 IMPLANT
TOWEL GREEN STERILE FF (TOWEL DISPOSABLE) ×2 IMPLANT
WATER STERILE IRR 1000ML POUR (IV SOLUTION) ×2 IMPLANT
YANKAUER SUCT BULB TIP NO VENT (SUCTIONS) ×2 IMPLANT

## 2021-02-15 NOTE — Anesthesia Postprocedure Evaluation (Signed)
Anesthesia Post Note  Patient: Troy Lambert.  Procedure(s) Performed: LEFT-SIDED LUMBAR FIVE- SACRUM ONE MICRODISECTOMY (Left: Back)     Patient location during evaluation: PACU Anesthesia Type: General Level of consciousness: awake and alert, oriented and patient cooperative Pain management: pain level controlled Vital Signs Assessment: post-procedure vital signs reviewed and stable Respiratory status: spontaneous breathing, nonlabored ventilation and respiratory function stable Cardiovascular status: blood pressure returned to baseline and stable Postop Assessment: no apparent nausea or vomiting Anesthetic complications: no   No notable events documented.  Last Vitals:  Vitals:   02/15/21 1415 02/15/21 1430  BP: 124/64 131/73  Pulse: 64 61  Resp: 19 17  Temp:  36.6 C  SpO2: 98% 98%    Last Pain:  Vitals:   02/15/21 1430  TempSrc:   PainSc: [place]

## 2021-02-15 NOTE — Transfer of Care (Signed)
Immediate Anesthesia Transfer of Care Note  Patient: Troy Lambert.  Procedure(s) Performed: LEFT-SIDED LUMBAR FIVE- SACRUM ONE MICRODISECTOMY (Left: Back)  Patient Location: PACU  Anesthesia Type:General  Level of Consciousness: awake, alert  and oriented  Airway & Oxygen Therapy: Patient Spontanous Breathing and Patient connected to nasal cannula oxygen  Post-op Assessment: Report given to RN and Post -op Vital signs reviewed and stable  Post vital signs: Reviewed and stable  Last Vitals:  Vitals Value Taken Time  BP 150/74 02/15/21 1330  Temp    Pulse 70 02/15/21 1339  Resp 14 02/15/21 1339  SpO2 98 % 02/15/21 1339  Vitals shown include unvalidated device data.  Last Pain:  Vitals:   02/15/21 1330  TempSrc:   PainSc: 6          Complications: No notable events documented.

## 2021-02-15 NOTE — Anesthesia Procedure Notes (Signed)
Procedure Name: Intubation Date/Time: 02/15/2021 11:27 AM Performed by: Trinna Post., CRNA Pre-anesthesia Checklist: Patient identified, Emergency Drugs available, Suction available, Patient being monitored and Timeout performed Patient Re-evaluated:Patient Re-evaluated prior to induction Oxygen Delivery Method: Circle system utilized Preoxygenation: Pre-oxygenation with 100% oxygen Induction Type: IV induction Ventilation: Mask ventilation without difficulty and Oral airway inserted - appropriate to patient size Laryngoscope Size: Mac and 4 Grade View: Grade II Tube type: Oral Tube size: 7.5 mm Number of attempts: 1 Airway Equipment and Method: Stylet Placement Confirmation: ETT inserted through vocal cords under direct vision, positive ETCO2 and breath sounds checked- equal and bilateral Secured at: 23 cm Tube secured with: Tape Dental Injury: Teeth and Oropharynx as per pre-operative assessment

## 2021-02-15 NOTE — Op Note (Signed)
PATIENT NAME: Troy Lambert.   MEDICAL RECORD NO.:   [id number]    DATE OF BIRTH: [birth year]   DATE OF PROCEDURE: 02/15/2021                               OPERATIVE REPORT     PREOPERATIVE DIAGNOSES: 1. Left-sided S1 radiculopathy 2. Large left-sided L5-S1 disk herniation causing severe     compression of the left S1 nerve   POSTOPERATIVE DIAGNOSES: 1. Left-sided S1 radiculopathy 2. Large left-sided L5-S1 disk herniation causing severe     compression of the left S1 nerve   PROCEDURES:  Left-sided L5-S1 laminotomy with partial facetectomy and removal of large herniated left-sided L5-S1 disk fragment.   SURGEON:  Phylliss Bob, MD.   ASSISTANTPricilla Holm, PA-C.   ANESTHESIA:  General endotracheal anesthesia.   COMPLICATIONS:  None.   DISPOSITION:  Stable.   ESTIMATED BLOOD LOSS:  Minimal.   INDICATIONS FOR SURGERY:  Briefly, Mr. Vredeveld is a pleasant 50 year old patient, who did present to me with severe pain in the left leg.  The patient's MRI did reveal the findings outlined above, clearly notable for a large herniated disk fragment. The pain was rather severe.  We did discuss treatment options and we did ultimately elect to proceed with the procedure reflected above.  The patient was fully made aware of the risks of surgery, including the risk of recurrent herniation and the need for subsequent surgery, including the possibility of a subsequent diskectomy and/or fusion.   OPERATIVE DETAILS:  On 02/15/2021, the patient was brought to Surgery and general endotracheal anesthesia was administered.  The patient was placed prone on a well-padded flat Jackson bed with a spinal frame.  Antibiotics were given.  The back was prepped and draped and a time-out procedure was performed.  At this point, a midline incision was made directly over the L5-S1 intervertebral space.  A curvilinear incision was made just to the left of the midline into the fascia.  A self-retaining  McCulloch retractor was placed.  The lamina of L5 and S1 was identified and subperiosteally exposed.  I then removed the lateral aspect of the L5-S1 ligamentum flavum.  Readily identified was the traversing left S1 nerve, which was noted to be under obvious tension, and was noted to be rather erythematous.  I was able to gently gain medial retraction of the nerve, and in doing so, a very large herniated disk fragment was readily noted.  I then made an annulotomy at the posterolateral aspect of the annulus. Free disc material was noted just beneath this.  I then explored the lateral recess and the intervertebral disc space, and there was noted to be additional more chronic appearing disc fragments.  These were removed using a series of up and downward angled micropituitary's.  I flushed the disc base out with saline on multiple occasions, in order to remove all free fragments, so as to minimize the risk of a recurrent disc herniation.  I was very pleased with the final decompression that I was able to accomplish.  At this point, the wound was copiously irrigated with normal saline.  All epidural bleeding was controlled using bipolar electrocautery in addition to Surgiflo. All bleeding was controlled at the termination of the procedure.  The wound was then closed in layers using #1 Vicryl followed by 0 Vicryl, followed by 4-0 Monocryl. Benzoin and Steri-Strips were applied followed by a sterile  dressing. All instrument counts were correct at the termination of the procedure.   Of note, Pricilla Holm was my assistant throughout surgery, and did aid in retraction, suctioning, and closure from start to finish.         Phylliss Bob, MD

## 2021-02-15 NOTE — H&P (Signed)
PREOPERATIVE H&P  Chief Complaint: Left leg pain  HPI: Troy Lambert. is a 50 y.o. male who presents with ongoing pain in the left leg  MRI reveals a left L5/S1 HNP  Patient has failed multiple forms of conservative care and continues to have pain (see office notes for additional details regarding the patient's full course of treatment)  Past Medical History:  Diagnosis Date   Allergy    Anemia    iron def anemia   Cancer ([place]) 12/2015   sqamous cell, basal cell skin cancers. Colon Cancer dx. surgery planned   Colon cancer ([place])    Complication of anesthesia    COVID 2022   Diverticulosis    never a problem   Dysrhythmia 07/08/2020   A fib episode, s/p DCCV, Eliquis x 4 weeks, CHADS2VASC score 0 post DCCV   Hypertension    Melanoma ([place]) 04/2017   back of the head   Pneumonia 10/2020   Bacterial   PONV (postoperative nausea and vomiting)    Right leg DVT ([place]) 01/10/2016   Past Surgical History:  Procedure Laterality Date   APPENDECTOMY  12/2015   CHOLECYSTECTOMY N/A 12/27/2015   Procedure: LAPAROSCOPIC CHOLECYSTECTOMY WITH INTRAOPERATIVE CHOLANGIOGRAM;  Surgeon: Greer Pickerel, MD;  Location: [facility];  Service: General;  Laterality: N/A;   COLON SURGERY     see notes   COLONOSCOPY  01/2017   DIAGNOSTIC LAPAROSCOPY     LAPAROSCOPIC PARTIAL COLECTOMY N/A 12/27/2015   Procedure: LAPAROSCOPIC PARTIAL RIGHT COLECTOMY;  Surgeon: Greer Pickerel, MD;  Location: [facility];  Service: General;  Laterality: N/A;   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   Procedure:  LAPAROSCOPIC SPLENECTOMY;  Surgeon: Greer Pickerel, MD;  Location: [facility];  Service: General;  Laterality: N/A;   TONSILLECTOMY     VASECTOMY     WISDOM TOOTH EXTRACTION     Social History   Socioeconomic History   Marital status: Married    Spouse name: [name]   Number of children: 2   Years of education: 12   Highest education level: Not on file  Occupational History   Not on file  Tobacco Use   Smoking  status: Never   Smokeless tobacco: Current    Types: Chew   Tobacco comments:    pt used smokless tobacco  Vaping Use   Vaping Use: Never used  Substance and Sexual Activity   Alcohol use: Yes    Alcohol/week: 3.0 - 4.0 standard drinks    Types: 3 - 4 Cans of beer per week   Drug use: No   Sexual activity: Yes  Other Topics Concern   Not on file  Social History Narrative   Lives with wife   Caffeine use: Drinks coffee daily or monster energy drinks   Social Determinants of Health   Financial Resource Strain: Not on file  Food Insecurity: Not on file  Transportation Needs: Not on file  Physical Activity: Not on file  Stress: Not on file  Social Connections: Not on file   Family History  Problem Relation Age of Onset   Lung cancer Father 74       mets to bone   Prostate cancer Paternal Uncle    Lung cancer Maternal Grandmother        smoker and worked in [place] Maternal Grandfather    Kidney cancer Maternal Grandfather    Heart attack Paternal Grandfather    Brain cancer Paternal Uncle  dx in his 25s   Colon cancer Neg Hx    Esophageal cancer Neg Hx    Pancreatic cancer Neg Hx    Rectal cancer Neg Hx    Stomach cancer Neg Hx    Colon polyps Neg Hx    Allergies  Allergen Reactions   Dilaudid [Hydromorphone Hcl] Itching   Prior to Admission medications   Medication Sig Start Date End Date Taking? Authorizing Provider  Cholecalciferol 25 MCG (1000 UT) capsule Take 1 capsule by mouth daily.   Yes [provider]  Cyanocobalamin (VITAMIN B 12 PO) Take 5,000 mcg by mouth daily.   Yes [provider]  lisinopril (ZESTRIL) 10 MG tablet Take 10 mg by mouth daily. 12/15/20  Yes [provider]  loratadine (CLARITIN) 10 MG tablet Take 10 mg by mouth daily.   Yes [provider]  montelukast (SINGULAIR) 10 MG tablet Take 10 mg by mouth at bedtime. 11/14/20  Yes [provider]  Multiple Vitamin (MULTIVITAMIN  WITH MINERALS) TABS tablet Take 1 tablet by mouth daily.   Yes [provider]  rosuvastatin (CRESTOR) 10 MG tablet Take 10 mg by mouth every evening. 05/10/20  Yes [provider]  testosterone cypionate (DEPOTESTOSTERONE CYPIONATE) 200 MG/ML injection Inject 200 mg into the muscle every 28 (twenty-eight) days. 06/19/16  Yes [provider]  zolpidem (AMBIEN) 10 MG tablet Take 10 mg by mouth at bedtime. 01/23/21  Yes [provider]  apixaban (ELIQUIS) 5 MG TABS tablet Take 1 tablet (5 mg total) by mouth 2 (two) times daily. Patient not taking: Reported on 01/31/2021 07/14/20 08/13/20  Fenton, Clint R, PA  psyllium (HYDROCIL/METAMUCIL) 95 % PACK Take 1 packet by mouth daily.    [provider]     All other systems have been reviewed and were otherwise negative with the exception of those mentioned in the HPI and as above.  Physical Exam: There were no vitals filed for this visit.  There is no height or weight on file to calculate BMI.  General: Alert, no acute distress Cardiovascular: No pedal edema Respiratory: No cyanosis, no use of accessory musculature Skin: No lesions in the area of chief complaint Neurologic: Sensation intact distally Psychiatric: Patient is competent for consent with normal mood and affect Lymphatic: No axillary or cervical lymphadenopathy   Assessment/Plan: LEFT SACRUM 1 RADICULOPATHY Plan for Procedure(s): LEFT-SIDED LUMBAR 5 - SACRUM 1 MICRODISECTOMY   Norva Karvonen, MD 02/15/2021 7:51 AM

## 2021-02-16 ENCOUNTER — Encounter (HOSPITAL_COMMUNITY): Payer: Self-pay | Admitting: Orthopedic Surgery

## 2021-07-14 ENCOUNTER — Other Ambulatory Visit: Payer: Self-pay | Admitting: Orthopedic Surgery

## 2021-07-14 DIAGNOSIS — M533 Sacrococcygeal disorders, not elsewhere classified: Secondary | ICD-10-CM

## 2021-08-04 ENCOUNTER — Other Ambulatory Visit: Payer: Self-pay

## 2021-08-04 ENCOUNTER — Ambulatory Visit
Admission: RE | Admit: 2021-08-04 | Discharge: 2021-08-04 | Disposition: A | Discharge status: Home / Self Care | Payer: BC Managed Care – PPO | Source: Ambulatory Visit | Attending: Orthopedic Surgery | Admitting: Orthopedic Surgery

## 2021-08-04 DIAGNOSIS — M533 Sacrococcygeal disorders, not elsewhere classified: Secondary | ICD-10-CM

## 2023-07-15 ENCOUNTER — Encounter: Payer: Self-pay | Admitting: Oncology
